# Patient Record
Sex: Female | Born: 1935
Health system: Southern US, Community
[De-identification: ages and names within clinical notes are randomized; demographics above are authoritative.]

## PROBLEM LIST (undated history)

## (undated) DIAGNOSIS — G459 Transient cerebral ischemic attack, unspecified: Secondary | ICD-10-CM

## (undated) DIAGNOSIS — H409 Unspecified glaucoma: Secondary | ICD-10-CM

## (undated) DIAGNOSIS — H269 Unspecified cataract: Secondary | ICD-10-CM

## (undated) DIAGNOSIS — I10 Essential (primary) hypertension: Secondary | ICD-10-CM

## (undated) DIAGNOSIS — I441 Atrioventricular block, second degree: Secondary | ICD-10-CM

## (undated) HISTORY — DX: Unspecified glaucoma: H40.9

## (undated) HISTORY — PX: CATARACT EXTRACTION: SUR2

## (undated) HISTORY — DX: Transient cerebral ischemic attack, unspecified: G45.9

## (undated) HISTORY — DX: Atrioventricular block, second degree: I44.1

## (undated) HISTORY — DX: Unspecified cataract: H26.9

---

## 2004-01-25 ENCOUNTER — Ambulatory Visit: Payer: Self-pay | Admitting: Unknown Physician Specialty

## 2005-01-29 ENCOUNTER — Ambulatory Visit: Payer: Self-pay | Admitting: Unknown Physician Specialty

## 2006-04-15 ENCOUNTER — Ambulatory Visit: Payer: Self-pay | Admitting: Unknown Physician Specialty

## 2007-06-30 ENCOUNTER — Ambulatory Visit: Payer: Self-pay | Admitting: Unknown Physician Specialty

## 2008-09-06 ENCOUNTER — Ambulatory Visit: Payer: Self-pay | Admitting: Unknown Physician Specialty

## 2009-09-19 ENCOUNTER — Ambulatory Visit: Payer: Self-pay | Admitting: Unknown Physician Specialty

## 2011-01-16 ENCOUNTER — Ambulatory Visit: Payer: Self-pay | Admitting: Unknown Physician Specialty

## 2012-09-29 ENCOUNTER — Ambulatory Visit: Payer: Self-pay | Admitting: Family Medicine

## 2014-06-23 ENCOUNTER — Other Ambulatory Visit: Payer: Self-pay | Admitting: Family Medicine

## 2014-06-23 ENCOUNTER — Ambulatory Visit
Admission: RE | Admit: 2014-06-23 | Discharge: 2014-06-23 | Disposition: A | Discharge status: Home / Self Care | Payer: PPO | Source: Ambulatory Visit | Attending: Family Medicine | Admitting: Family Medicine

## 2014-06-23 ENCOUNTER — Ambulatory Visit: Payer: Self-pay

## 2014-06-23 DIAGNOSIS — R600 Localized edema: Secondary | ICD-10-CM | POA: Insufficient documentation

## 2014-06-29 ENCOUNTER — Other Ambulatory Visit: Payer: Self-pay | Admitting: Family Medicine

## 2014-06-29 DIAGNOSIS — R600 Localized edema: Secondary | ICD-10-CM

## 2014-07-04 ENCOUNTER — Ambulatory Visit: Payer: PPO

## 2015-02-28 DIAGNOSIS — H401132 Primary open-angle glaucoma, bilateral, moderate stage: Secondary | ICD-10-CM | POA: Diagnosis not present

## 2015-05-28 DIAGNOSIS — H401132 Primary open-angle glaucoma, bilateral, moderate stage: Secondary | ICD-10-CM | POA: Diagnosis not present

## 2015-06-12 DIAGNOSIS — Z1389 Encounter for screening for other disorder: Secondary | ICD-10-CM | POA: Diagnosis not present

## 2015-06-12 DIAGNOSIS — Z0000 Encounter for general adult medical examination without abnormal findings: Secondary | ICD-10-CM | POA: Diagnosis not present

## 2015-06-12 DIAGNOSIS — Z1211 Encounter for screening for malignant neoplasm of colon: Secondary | ICD-10-CM | POA: Diagnosis not present

## 2015-10-01 DIAGNOSIS — H40153 Residual stage of open-angle glaucoma, bilateral: Secondary | ICD-10-CM | POA: Diagnosis not present

## 2016-01-21 DIAGNOSIS — H40153 Residual stage of open-angle glaucoma, bilateral: Secondary | ICD-10-CM | POA: Diagnosis not present

## 2016-06-09 DIAGNOSIS — H40153 Residual stage of open-angle glaucoma, bilateral: Secondary | ICD-10-CM | POA: Diagnosis not present

## 2016-10-13 DIAGNOSIS — H40153 Residual stage of open-angle glaucoma, bilateral: Secondary | ICD-10-CM | POA: Diagnosis not present

## 2016-10-16 DIAGNOSIS — I10 Essential (primary) hypertension: Secondary | ICD-10-CM | POA: Diagnosis not present

## 2016-10-22 ENCOUNTER — Other Ambulatory Visit: Payer: Self-pay | Admitting: Family Medicine

## 2016-10-22 DIAGNOSIS — M810 Age-related osteoporosis without current pathological fracture: Secondary | ICD-10-CM

## 2016-12-01 ENCOUNTER — Other Ambulatory Visit: Payer: PPO

## 2017-01-22 DIAGNOSIS — I8310 Varicose veins of unspecified lower extremity with inflammation: Secondary | ICD-10-CM | POA: Diagnosis not present

## 2017-02-27 DIAGNOSIS — I10 Essential (primary) hypertension: Secondary | ICD-10-CM | POA: Diagnosis not present

## 2017-02-27 DIAGNOSIS — B029 Zoster without complications: Secondary | ICD-10-CM | POA: Diagnosis not present

## 2017-03-20 DIAGNOSIS — B029 Zoster without complications: Secondary | ICD-10-CM | POA: Diagnosis not present

## 2017-03-20 DIAGNOSIS — D649 Anemia, unspecified: Secondary | ICD-10-CM | POA: Diagnosis not present

## 2017-03-20 DIAGNOSIS — I10 Essential (primary) hypertension: Secondary | ICD-10-CM | POA: Diagnosis not present

## 2017-03-20 DIAGNOSIS — Z23 Encounter for immunization: Secondary | ICD-10-CM | POA: Diagnosis not present

## 2017-04-06 ENCOUNTER — Emergency Department
Admission: EM | Admit: 2017-04-06 | Discharge: 2017-04-06 | Disposition: A | Discharge status: Home / Self Care | Payer: PPO | Attending: Emergency Medicine | Admitting: Emergency Medicine

## 2017-04-06 ENCOUNTER — Encounter: Payer: Self-pay | Admitting: Emergency Medicine

## 2017-04-06 ENCOUNTER — Other Ambulatory Visit: Payer: Self-pay

## 2017-04-06 ENCOUNTER — Emergency Department: Payer: PPO

## 2017-04-06 DIAGNOSIS — M79641 Pain in right hand: Secondary | ICD-10-CM | POA: Diagnosis not present

## 2017-04-06 DIAGNOSIS — S0003XA Contusion of scalp, initial encounter: Secondary | ICD-10-CM | POA: Insufficient documentation

## 2017-04-06 DIAGNOSIS — S0993XA Unspecified injury of face, initial encounter: Secondary | ICD-10-CM | POA: Diagnosis not present

## 2017-04-06 DIAGNOSIS — M25561 Pain in right knee: Secondary | ICD-10-CM | POA: Diagnosis not present

## 2017-04-06 DIAGNOSIS — Y92129 Unspecified place in nursing home as the place of occurrence of the external cause: Secondary | ICD-10-CM | POA: Diagnosis not present

## 2017-04-06 DIAGNOSIS — S0081XA Abrasion of other part of head, initial encounter: Secondary | ICD-10-CM | POA: Diagnosis not present

## 2017-04-06 DIAGNOSIS — W19XXXA Unspecified fall, initial encounter: Secondary | ICD-10-CM | POA: Insufficient documentation

## 2017-04-06 DIAGNOSIS — S80211A Abrasion, right knee, initial encounter: Secondary | ICD-10-CM | POA: Diagnosis not present

## 2017-04-06 DIAGNOSIS — Y999 Unspecified external cause status: Secondary | ICD-10-CM | POA: Insufficient documentation

## 2017-04-06 DIAGNOSIS — Y9389 Activity, other specified: Secondary | ICD-10-CM | POA: Insufficient documentation

## 2017-04-06 DIAGNOSIS — S8991XA Unspecified injury of right lower leg, initial encounter: Secondary | ICD-10-CM | POA: Diagnosis not present

## 2017-04-06 DIAGNOSIS — S0990XA Unspecified injury of head, initial encounter: Secondary | ICD-10-CM | POA: Diagnosis present

## 2017-04-06 DIAGNOSIS — S60511A Abrasion of right hand, initial encounter: Secondary | ICD-10-CM | POA: Diagnosis not present

## 2017-04-06 DIAGNOSIS — Z23 Encounter for immunization: Secondary | ICD-10-CM | POA: Insufficient documentation

## 2017-04-06 HISTORY — DX: Essential (primary) hypertension: I10

## 2017-04-06 MED ORDER — BACITRACIN ZINC 500 UNIT/GM EX OINT
TOPICAL_OINTMENT | CUTANEOUS | Status: AC
  Administered 2017-04-06: 1 via TOPICAL
  Filled 2017-04-06: qty 0.9

## 2017-04-06 MED ORDER — ACETAMINOPHEN 325 MG PO TABS
650.0000 mg | ORAL_TABLET | Freq: Once | ORAL | Status: AC
  Administered 2017-04-06: 650 mg via ORAL
  Filled 2017-04-06: qty 2

## 2017-04-06 MED ORDER — TETANUS-DIPHTH-ACELL PERTUSSIS 5-2.5-18.5 LF-MCG/0.5 IM SUSP
0.5000 mL | Freq: Once | INTRAMUSCULAR | Status: AC
  Administered 2017-04-06: 0.5 mL via INTRAMUSCULAR
  Filled 2017-04-06: qty 0.5

## 2017-04-06 MED ORDER — BACITRACIN ZINC 500 UNIT/GM EX OINT
TOPICAL_OINTMENT | Freq: Every day | CUTANEOUS | Status: DC
  Administered 2017-04-06: 1 via TOPICAL

## 2017-04-06 NOTE — ED Notes (Signed)
See triage note  States she fell today    hhit the cement  Abrasions noted to right side of forehead and temples ,right hand and right knee  No loc

## 2017-04-06 NOTE — ED Provider Notes (Signed)
[facility] Emergency Department Provider Note  ____________________________________________  Time seen: Approximately 5:18 PM  I have reviewed the triage vital signs and the nursing notes.   HISTORY  Chief Complaint Fall    HPI Cassandra Patterson is a 84 y.o. female presents to the emergency department after patient fell while leaving hospice after visiting her sister-in-law.  Patient struck the right side of her forehead and face as well as her right hand and right knee.  Patient is accompanied by her sister who reports no changes in behavior.  Patient denies new blurry vision, nausea, vomiting or disorientation.  Patient was able to ambulate without difficulty and has had no neck pain or changes in sensation in the upper or lower extremities.  No medications were attempted prior to presenting to the emergency department.  Patient lives with her husband independently.   Past Medical History:  Diagnosis Date  . Hypertension     There are no active problems to display for this patient.   History reviewed. No pertinent surgical history.  Prior to Admission medications   Not on File    Allergies Patient has no known allergies.  No family history on file.  Social History Social History   Tobacco Use  . Smoking status: Never Smoker  . Smokeless tobacco: Never Used  Substance Use Topics  . Alcohol use: No    Frequency: Never  . Drug use: No     Review of Systems  Constitutional: No fever/chills Eyes: No visual changes. No discharge ENT: No upper respiratory complaints. Cardiovascular: no chest pain. Respiratory: no cough. No SOB. Gastrointestinal: No abdominal pain.  No nausea, no vomiting.  No diarrhea.  No constipation. Musculoskeletal: Negative for musculoskeletal pain. Skin: Patient has abrasions. Neurological: Negative for headaches, focal weakness or numbness.   ____________________________________________   PHYSICAL  EXAM:  VITAL SIGNS: ED Triage Vitals [04/06/17 1508]  Enc Vitals Group     BP 133/81     Pulse Rate 74     Resp 16     Temp (!) 97.2 F (36.2 C)     Temp Source Oral     SpO2 99 %     Weight 110 lb (49.9 kg)     Height      Head Circumference      Peak Flow      Pain Score      Pain Loc      Pain Edu?      Excl. in GC?      Constitutional: Alert and oriented. Well appearing and in no acute distress. Eyes: Conjunctivae are normal. PERRL. EOMI. Head: Atraumatic. ENT:      Ears: TMs are pearly      Nose: No congestion/rhinnorhea.      Mouth/Throat: Mucous membranes are moist.  Neck: No stridor.  No cervical spine tenderness to palpation. Cardiovascular: Normal rate, regular rhythm. Normal S1 and S2.  Good peripheral circulation. Respiratory: Normal respiratory effort without tachypnea or retractions. Lungs CTAB. Good air entry to the bases with no decreased or absent breath sounds. Gastrointestinal: Bowel sounds 4 quadrants. Soft and nontender to palpation. No guarding or rigidity. No palpable masses. No distention. No CVA tenderness. Musculoskeletal: Full range of motion to all extremities. No gross deformities appreciated. Neurologic:  Normal speech and language. No gross focal neurologic deficits are appreciated.  Skin: Patient has facial abrasions and abrasions of right hand and right knee. Psychiatric: Mood and affect are normal. Speech and behavior are normal.  Patient exhibits appropriate insight and judgement.   ____________________________________________   LABS (all labs ordered are listed, but only abnormal results are displayed)  Labs Reviewed - No data to display ____________________________________________  EKG   ____________________________________________  RADIOLOGY Geraldo Pitter, personally viewed and evaluated these images (plain radiographs) as part of my medical decision making, as well as reviewing the written report by the  radiologist.  Ct Head Wo Contrast  Result Date: 04/06/2017 CLINICAL DATA:  Fall.  Right forehead and cheek abrasions. EXAM: CT HEAD WITHOUT CONTRAST CT MAXILLOFACIAL WITHOUT CONTRAST TECHNIQUE: Multidetector CT imaging of the head and maxillofacial structures were performed using the standard protocol without intravenous contrast. Multiplanar CT image reconstructions of the maxillofacial structures were also generated. COMPARISON:  None. FINDINGS: CT HEAD FINDINGS Brain: No evidence of acute infarction, hemorrhage, hydrocephalus, extra-axial collection or mass lesion/mass effect. Moderate age related cerebral atrophy. Vascular: Atherosclerotic vascular calcification of the carotid siphons. No hyperdense vessel. Skull: Negative for fracture or focal lesion. Other: Small right frontal scalp hematoma. CT MAXILLOFACIAL FINDINGS Osseous: No fracture or mandibular dislocation. No destructive process. Degenerative changes of the cervical spine. Orbits: Negative. No traumatic or inflammatory finding. Sinuses: Clear. Soft tissues: Negative. IMPRESSION: 1. No acute intracranial abnormality. Small right frontal scalp hematoma. 2. No facial fracture. Electronically Signed   By: Obie Dredge M.D.   On: 04/06/2017 16:07   Dg Knee Complete 4 Views Right  Result Date: 04/06/2017 CLINICAL DATA:  Larey Seat today with trauma to the right side of the face, right hand and right knee. EXAM: RIGHT KNEE - COMPLETE 4+ VIEW COMPARISON:  None. FINDINGS: No evidence of fracture, dislocation, or joint effusion. No evidence of arthropathy or other focal bone abnormality. Soft tissues are unremarkable. IMPRESSION: Negative. Electronically Signed   By: Paulina Fusi M.D.   On: 04/06/2017 16:37   Dg Hand Complete Right  Result Date: 04/06/2017 CLINICAL DATA:  Acute right hand pain following fall today. Initial encounter. EXAM: RIGHT HAND - COMPLETE 3+ VIEW COMPARISON:  None. FINDINGS: There is no evidence of fracture or dislocation. No  focal bony lesions are present. Soft tissues are unremarkable. IMPRESSION: No evidence of acute abnormality. Electronically Signed   By: Harmon Pier M.D.   On: 04/06/2017 16:34   Ct Maxillofacial Wo Contrast  Result Date: 04/06/2017 CLINICAL DATA:  Fall.  Right forehead and cheek abrasions. EXAM: CT HEAD WITHOUT CONTRAST CT MAXILLOFACIAL WITHOUT CONTRAST TECHNIQUE: Multidetector CT imaging of the head and maxillofacial structures were performed using the standard protocol without intravenous contrast. Multiplanar CT image reconstructions of the maxillofacial structures were also generated. COMPARISON:  None. FINDINGS: CT HEAD FINDINGS Brain: No evidence of acute infarction, hemorrhage, hydrocephalus, extra-axial collection or mass lesion/mass effect. Moderate age related cerebral atrophy. Vascular: Atherosclerotic vascular calcification of the carotid siphons. No hyperdense vessel. Skull: Negative for fracture or focal lesion. Other: Small right frontal scalp hematoma. CT MAXILLOFACIAL FINDINGS Osseous: No fracture or mandibular dislocation. No destructive process. Degenerative changes of the cervical spine. Orbits: Negative. No traumatic or inflammatory finding. Sinuses: Clear. Soft tissues: Negative. IMPRESSION: 1. No acute intracranial abnormality. Small right frontal scalp hematoma. 2. No facial fracture. Electronically Signed   By: Obie Dredge M.D.   On: 04/06/2017 16:07    ____________________________________________    PROCEDURES  Procedure(s) performed:    Procedures    Medications  acetaminophen (TYLENOL) tablet 650 mg (not administered)  Tdap (BOOSTRIX) injection 0.5 mL (not administered)     ____________________________________________   INITIAL IMPRESSION /  ASSESSMENT AND PLAN / ED COURSE  Pertinent labs & imaging results that were available during my care of the patient were reviewed by me and considered in my medical decision making (see chart for details).  Review  of the [place] CSRS was performed in accordance of the [facility] prior to dispensing any controlled drugs.     Assessment and plan Fall Differential diagnosis originally included subdural hematoma, skull fracture, fracture of the upper or lower extremities, knee contusion and skin abrasions.  Patient presented to the emergency department after falling while leaving hospice while visiting a family member.  Overall neurologic exam was reassuring.  CT head and CT maxillofacial revealed no acute abnormality. x-rays of the right hand and right knee were reviewed by myself and revealed no acute fractures or bony abnormalities.  Patient's tetanus was updated in the emergency department and Tylenol was recommended for pain.  All patient questions were answered.     ____________________________________________  FINAL CLINICAL IMPRESSION(S) / ED DIAGNOSES  Final diagnoses:  Fall, initial encounter      NEW MEDICATIONS STARTED DURING THIS VISIT:  ED Discharge Orders    None          This chart was dictated using voice recognition software/Dragon. Despite best efforts to proofread, errors can occur which can change the meaning. Any change was purely unintentional.    Orvil Feil, PA-C 04/06/17 1721    Phineas Semen, MD 04/06/17 Paulo Fruit

## 2017-04-06 NOTE — ED Triage Notes (Signed)
Pt to ed with c/o fall today and hit right side of face, right hand and right knee.  Abrasions noted to right forehead, and right cheek, and right hand and right knee, bleeding controlled.  Denies loss of consciousness.  Pt denies use of blood thinners.  Pt alert and oriented at this time.

## 2017-04-07 ENCOUNTER — Other Ambulatory Visit: Payer: Self-pay

## 2017-05-01 DIAGNOSIS — L03115 Cellulitis of right lower limb: Secondary | ICD-10-CM | POA: Diagnosis not present

## 2017-05-15 DIAGNOSIS — L03115 Cellulitis of right lower limb: Secondary | ICD-10-CM | POA: Diagnosis not present

## 2017-05-15 DIAGNOSIS — I10 Essential (primary) hypertension: Secondary | ICD-10-CM | POA: Diagnosis not present

## 2017-09-24 DIAGNOSIS — H40153 Residual stage of open-angle glaucoma, bilateral: Secondary | ICD-10-CM | POA: Diagnosis not present

## 2017-10-08 DIAGNOSIS — R634 Abnormal weight loss: Secondary | ICD-10-CM | POA: Diagnosis not present

## 2017-10-20 DIAGNOSIS — R634 Abnormal weight loss: Secondary | ICD-10-CM | POA: Diagnosis not present

## 2017-12-11 DIAGNOSIS — Z961 Presence of intraocular lens: Secondary | ICD-10-CM | POA: Diagnosis not present

## 2017-12-11 DIAGNOSIS — H2513 Age-related nuclear cataract, bilateral: Secondary | ICD-10-CM | POA: Diagnosis not present

## 2017-12-17 DIAGNOSIS — H2512 Age-related nuclear cataract, left eye: Secondary | ICD-10-CM | POA: Diagnosis not present

## 2017-12-30 DIAGNOSIS — H2512 Age-related nuclear cataract, left eye: Secondary | ICD-10-CM | POA: Diagnosis not present

## 2018-01-28 DIAGNOSIS — H40153 Residual stage of open-angle glaucoma, bilateral: Secondary | ICD-10-CM | POA: Diagnosis not present

## 2018-01-28 DIAGNOSIS — Z961 Presence of intraocular lens: Secondary | ICD-10-CM | POA: Diagnosis not present

## 2018-06-18 DIAGNOSIS — I10 Essential (primary) hypertension: Secondary | ICD-10-CM | POA: Diagnosis not present

## 2018-07-19 DIAGNOSIS — H40153 Residual stage of open-angle glaucoma, bilateral: Secondary | ICD-10-CM | POA: Diagnosis not present

## 2018-09-23 DIAGNOSIS — H40153 Residual stage of open-angle glaucoma, bilateral: Secondary | ICD-10-CM | POA: Diagnosis not present

## 2018-11-09 IMAGING — CR DG HAND COMPLETE 3+V*R*
1 series · 3 of 3 positions shown · non-contrast
Comparison: None.

CLINICAL DATA: Acute right hand pain following fall today. Initial
encounter.

EXAM:
RIGHT HAND - COMPLETE 3+ VIEW

[Series 1: dg hand complete right · 0.14mm/px · 3 of 3 slices shown]
[im 1/3]
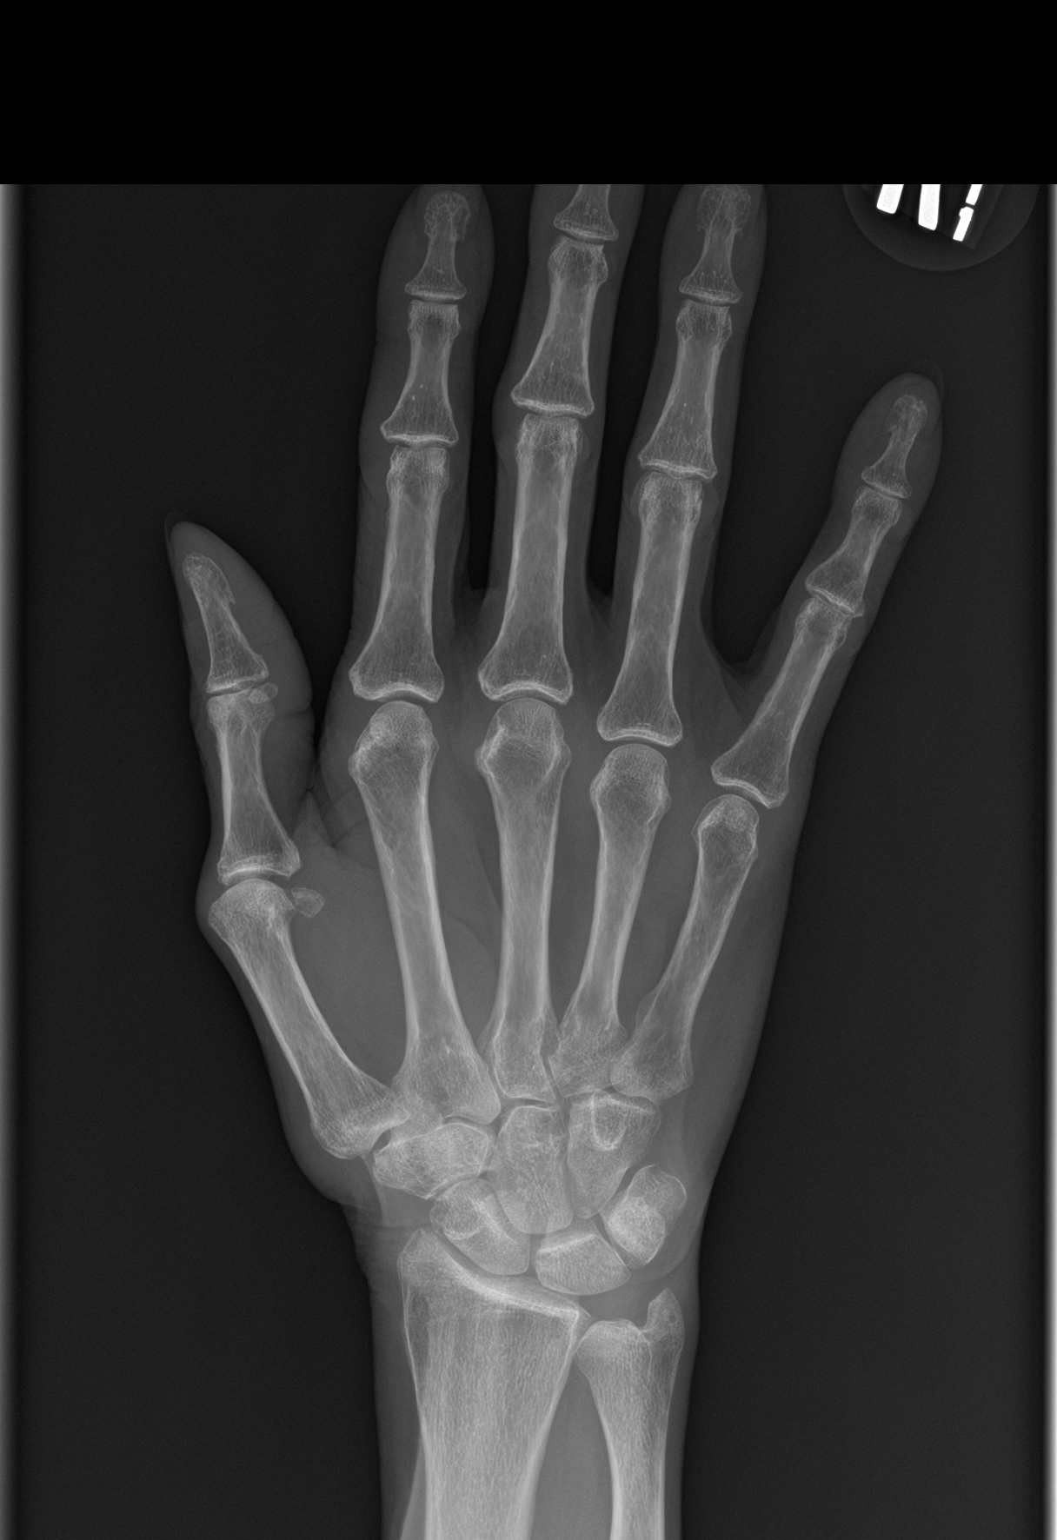
[im 2/3]
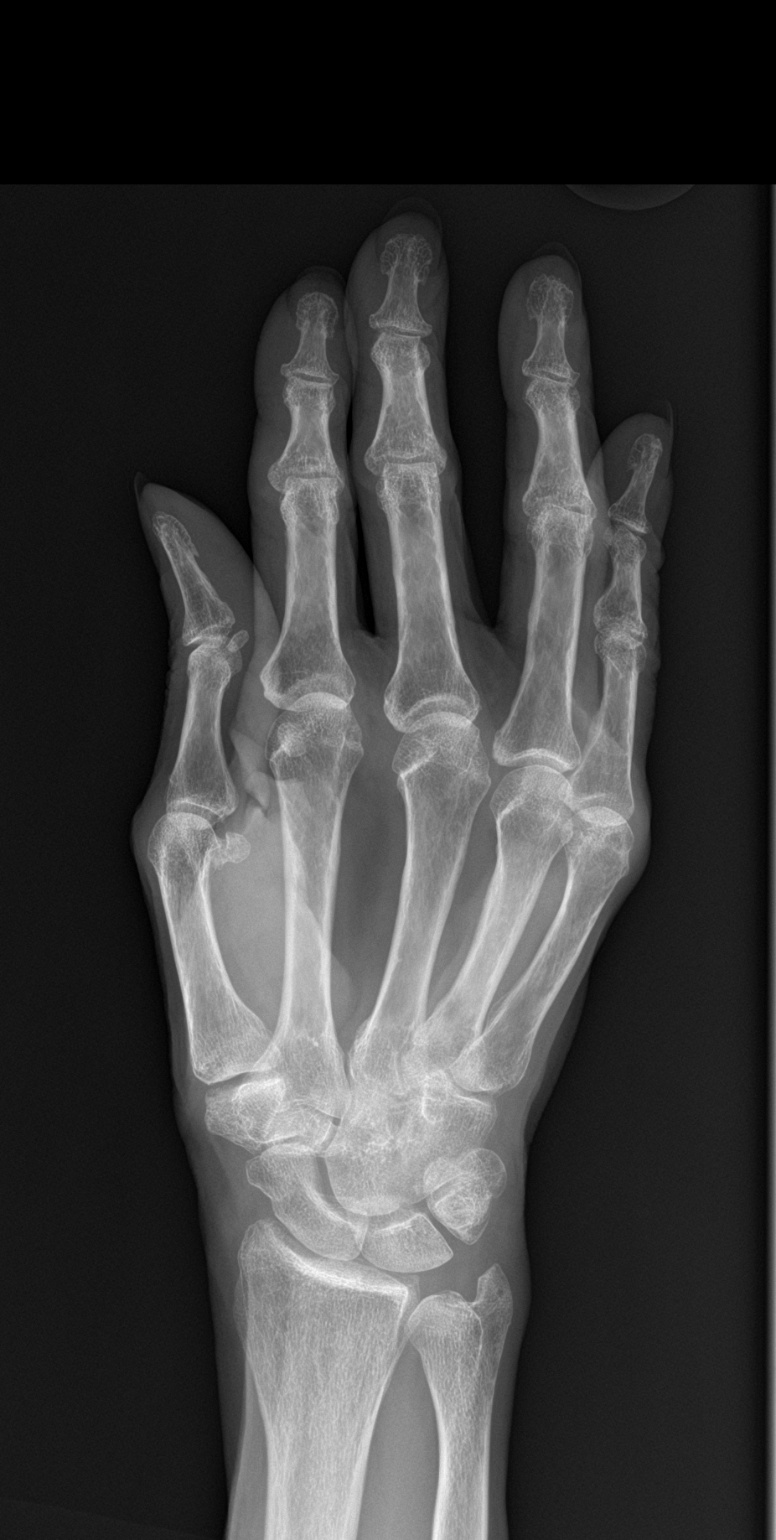
[im 3/3]
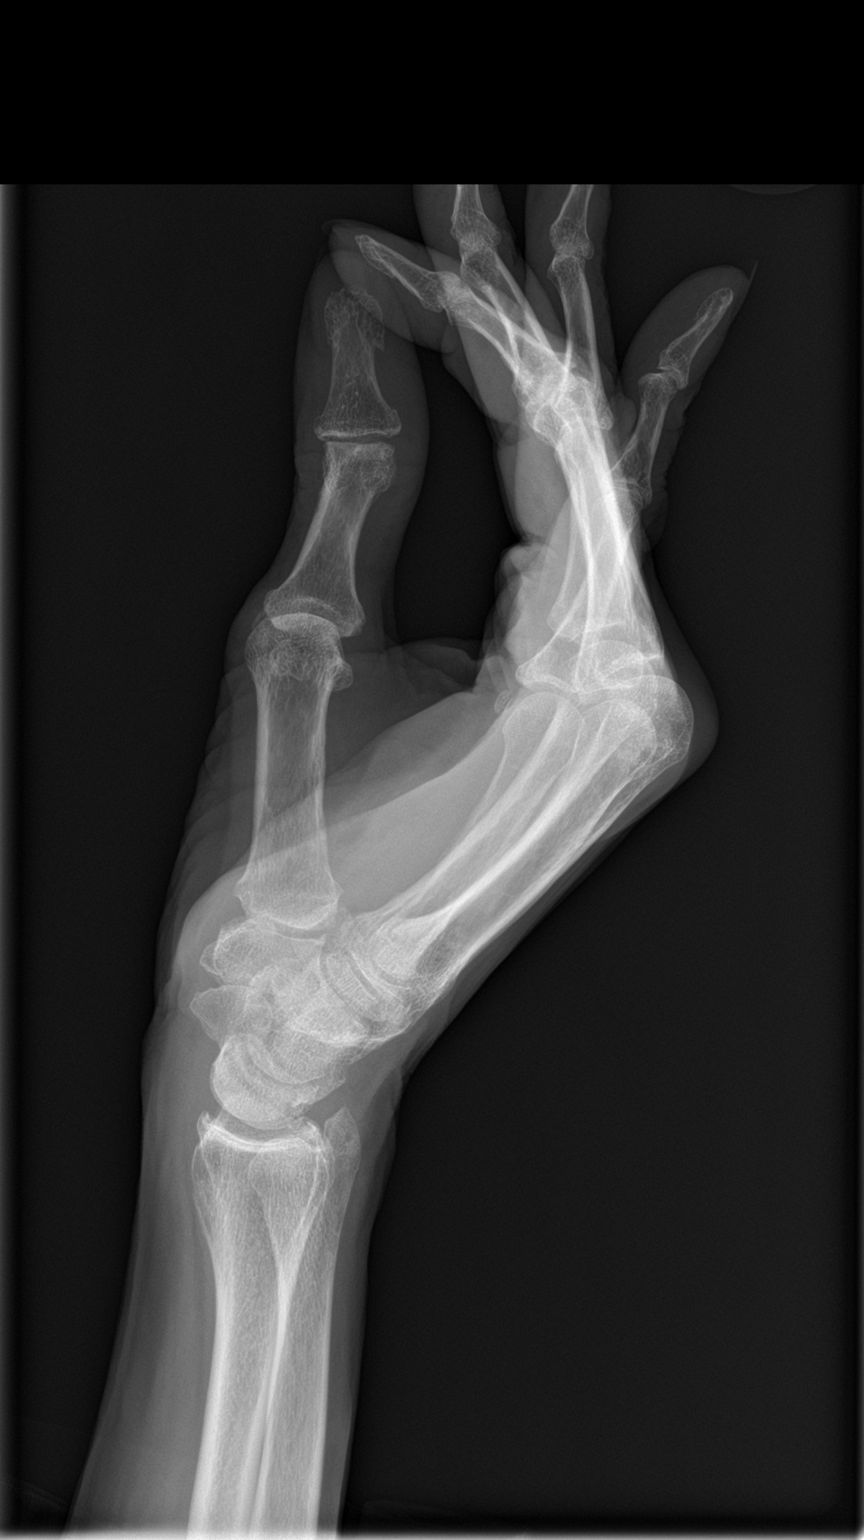

[3 of 3 positions shown; findings below may reference images not displayed]

FINDINGS: There is no evidence of fracture or dislocation.

No focal bony lesions are present.

Soft tissues are unremarkable.
IMPRESSION: No evidence of acute abnormality.

## 2018-11-22 DIAGNOSIS — H40153 Residual stage of open-angle glaucoma, bilateral: Secondary | ICD-10-CM | POA: Diagnosis not present

## 2018-12-14 DIAGNOSIS — E559 Vitamin D deficiency, unspecified: Secondary | ICD-10-CM | POA: Diagnosis not present

## 2018-12-14 DIAGNOSIS — I10 Essential (primary) hypertension: Secondary | ICD-10-CM | POA: Diagnosis not present

## 2019-01-06 DIAGNOSIS — Z20828 Contact with and (suspected) exposure to other viral communicable diseases: Secondary | ICD-10-CM | POA: Diagnosis not present

## 2019-03-01 DIAGNOSIS — L989 Disorder of the skin and subcutaneous tissue, unspecified: Secondary | ICD-10-CM | POA: Diagnosis not present

## 2019-03-01 DIAGNOSIS — Z1389 Encounter for screening for other disorder: Secondary | ICD-10-CM | POA: Diagnosis not present

## 2019-03-02 DIAGNOSIS — Z23 Encounter for immunization: Secondary | ICD-10-CM | POA: Diagnosis not present

## 2019-03-23 DIAGNOSIS — Z23 Encounter for immunization: Secondary | ICD-10-CM | POA: Diagnosis not present

## 2019-03-29 DIAGNOSIS — H40153 Residual stage of open-angle glaucoma, bilateral: Secondary | ICD-10-CM | POA: Diagnosis not present

## 2019-04-04 DIAGNOSIS — I8310 Varicose veins of unspecified lower extremity with inflammation: Secondary | ICD-10-CM | POA: Diagnosis not present

## 2019-04-21 DIAGNOSIS — I10 Essential (primary) hypertension: Secondary | ICD-10-CM | POA: Diagnosis not present

## 2019-04-21 DIAGNOSIS — Z23 Encounter for immunization: Secondary | ICD-10-CM | POA: Diagnosis not present

## 2019-04-21 DIAGNOSIS — M810 Age-related osteoporosis without current pathological fracture: Secondary | ICD-10-CM | POA: Diagnosis not present

## 2019-06-24 DIAGNOSIS — M5441 Lumbago with sciatica, right side: Secondary | ICD-10-CM | POA: Diagnosis not present

## 2019-08-01 DIAGNOSIS — H40153 Residual stage of open-angle glaucoma, bilateral: Secondary | ICD-10-CM | POA: Diagnosis not present

## 2019-12-19 DIAGNOSIS — M2012 Hallux valgus (acquired), left foot: Secondary | ICD-10-CM | POA: Diagnosis not present

## 2019-12-19 DIAGNOSIS — M79675 Pain in left toe(s): Secondary | ICD-10-CM | POA: Diagnosis not present

## 2019-12-19 DIAGNOSIS — M2011 Hallux valgus (acquired), right foot: Secondary | ICD-10-CM | POA: Diagnosis not present

## 2019-12-19 DIAGNOSIS — M79674 Pain in right toe(s): Secondary | ICD-10-CM | POA: Diagnosis not present

## 2019-12-19 DIAGNOSIS — L600 Ingrowing nail: Secondary | ICD-10-CM | POA: Diagnosis not present

## 2019-12-19 DIAGNOSIS — B351 Tinea unguium: Secondary | ICD-10-CM | POA: Diagnosis not present

## 2020-01-09 DIAGNOSIS — R002 Palpitations: Secondary | ICD-10-CM | POA: Diagnosis not present

## 2020-01-25 ENCOUNTER — Encounter: Payer: Self-pay | Admitting: Internal Medicine

## 2020-01-25 ENCOUNTER — Ambulatory Visit: Payer: PPO | Admitting: Internal Medicine

## 2020-01-25 ENCOUNTER — Ambulatory Visit (INDEPENDENT_AMBULATORY_CARE_PROVIDER_SITE_OTHER): Payer: PPO

## 2020-01-25 ENCOUNTER — Other Ambulatory Visit: Payer: Self-pay

## 2020-01-25 VITALS — BP 130/70 | HR 48 | Ht 60.0 in | Wt 97.0 lb

## 2020-01-25 DIAGNOSIS — I440 Atrioventricular block, first degree: Secondary | ICD-10-CM | POA: Diagnosis not present

## 2020-01-25 DIAGNOSIS — R002 Palpitations: Secondary | ICD-10-CM | POA: Diagnosis not present

## 2020-01-25 DIAGNOSIS — R001 Bradycardia, unspecified: Secondary | ICD-10-CM

## 2020-01-25 DIAGNOSIS — I10 Essential (primary) hypertension: Secondary | ICD-10-CM

## 2020-01-25 DIAGNOSIS — R011 Cardiac murmur, unspecified: Secondary | ICD-10-CM

## 2020-01-25 NOTE — Patient Instructions (Signed)
Ask your eye doctor if you can stop your Timolol because of your Bradycardia and first degree heart block.   Medication Instructions:  Your physician recommends that you continue on your current medications as directed. Please refer to the Current Medication list given to you today.  *If you need a refill on your cardiac medications before your next appointment, please call your pharmacy*   Lab Work: none If you have labs (blood work) drawn today and your tests are completely normal, you will receive your results only by: Marland Kitchen MyChart Message (if you have MyChart) OR . A paper copy in the mail If you have any lab test that is abnormal or we need to change your treatment, we will call you to review the results.   Testing/Procedures: 1- Your physician has requested that you have an echocardiogram. Echocardiography is a painless test that uses sound waves to create images of your heart. It provides your doctor with information about the size and shape of your heart and how well your heart's chambers and valves are working. This procedure takes approximately one hour. There are no restrictions for this procedure. There is a possibility that an IV may need to be started during your test to inject an image enhancing agent. This is done to obtain more optimal pictures of your heart. Therefore we ask that you do at least drink some water prior to coming in to hydrate your veins.   2- ZIO XT MONITOR FOR 14 days. Your physician has recommended that you wear a Zio monitor. This monitor is a medical device that records the heart's electrical activity. Doctors most often use these monitors to diagnose arrhythmias. Arrhythmias are problems with the speed or rhythm of the heartbeat. The monitor is a small device applied to your chest. You can wear one while you do your normal daily activities. While wearing this monitor if you have any symptoms to push the button and record what you felt. Once you have worn this  monitor for the period of time provider prescribed (Usually 14 days), you will return the monitor device in the postage paid box. Once it is returned they will download the data collected and provide [place] with a report which the provider will then review and we will call you with those results. Important tips:  1. Avoid showering during the first 24 hours of wearing the monitor. 2. Avoid excessive sweating to help maximize wear time. 3. Do not submerge the device, no hot tubs, and no swimming pools. 4. Keep any lotions or oils away from the patch. 5. After 24 hours you may shower with the patch on. Take brief showers with your back facing the shower head.  6. Do not remove patch once it has been placed because that will interrupt data and decrease adhesive wear time. 7. Push the button when you have any symptoms and write down what you were feeling. 8. Once you have completed wearing your monitor, remove and place into box which has postage paid and place in your outgoing mailbox.  9. If for some reason you have misplaced your box then call our office and we can provide another box and/or mail it off for you.   Follow-Up: At [facility], you and your health needs are our priority.  As part of our continuing mission to provide you with exceptional heart care, we have created designated Provider Care Teams.  These Care Teams include your primary Cardiologist (physician) and Advanced Practice Providers (APPs -  Physician Assistants and Nurse Practitioners) who all work together to provide you with the care you need, when you need it.  We recommend signing up for the patient portal called "MyChart".  Sign up information is provided on this After Visit Summary.  MyChart is used to connect with patients for Virtual Visits (Telemedicine).  Patients are able to view lab/test results, encounter notes, upcoming appointments, etc.  Non-urgent messages can be sent to your provider as well.   To learn more  about what you can do with MyChart, go to [email or web address].    Your next appointment:   6 week(s)  The format for your next appointment:   In Person  Provider:   You may see DR Cristal Deer END or one of the following Advanced Practice Providers on your designated Care Team:    Nicolasa Ducking, NP  Eula Listen, PA-C  Marisue Ivan, PA-C  [place], [place]  Gillian Shields, NP   Echocardiogram An echocardiogram is a procedure that uses painless sound waves (ultrasound) to produce an image of the heart. Images from an echocardiogram can provide important information about:  Signs of coronary artery disease (CAD).  Aneurysm detection. An aneurysm is a weak or damaged part of an artery wall that bulges out from the normal force of blood pumping through the body.  Heart size and shape. Changes in the size or shape of the heart can be associated with certain conditions, including heart failure, aneurysm, and CAD.  Heart muscle function.  Heart valve function.  Signs of a past heart attack.  Fluid buildup around the heart.  Thickening of the heart muscle.  A tumor or infectious growth around the heart valves. Tell a health care provider about:  Any allergies you have.  All medicines you are taking, including vitamins, herbs, eye drops, creams, and over-the-counter medicines.  Any blood disorders you have.  Any surgeries you have had.  Any medical conditions you have.  Whether you are pregnant or may be pregnant. What are the risks? Generally, this is a safe procedure. However, problems may occur, including:  Allergic reaction to dye (contrast) that may be used during the procedure. What happens before the procedure? No specific preparation is needed. You may eat and drink normally. What happens during the procedure?   An IV tube may be inserted into one of your veins.  You may receive contrast through this tube. A contrast is an injection that  improves the quality of the pictures from your heart.  A gel will be applied to your chest.  A wand-like tool (transducer) will be moved over your chest. The gel will help to transmit the sound waves from the transducer.  The sound waves will harmlessly bounce off of your heart to allow the heart images to be captured in real-time motion. The images will be recorded on a computer. The procedure may vary among health care providers and hospitals. What happens after the procedure?  You may return to your normal, everyday life, including diet, activities, and medicines, unless your health care provider tells you not to do that. Summary  An echocardiogram is a procedure that uses painless sound waves (ultrasound) to produce an image of the heart.  Images from an echocardiogram can provide important information about the size and shape of your heart, heart muscle function, heart valve function, and fluid buildup around your heart.  You do not need to do anything to prepare before this procedure. You may eat and drink normally.  After  the echocardiogram is completed, you may return to your normal, everyday life, unless your health care provider tells you not to do that. This information is not intended to replace advice given to you by your health care provider. Make sure you discuss any questions you have with your health care provider. Document Revised: 05/27/2018 Document Reviewed: 03/08/2016 Elsevier Patient Education  2020 [employer].

## 2020-01-25 NOTE — Progress Notes (Signed)
New Outpatient Visit Date: 01/25/2020  Referring Provider: Leanna Sato, MD [address] [place],  [place] [address]  Chief Complaint: Palpitations  HPI:  Ms. Gilham is a 86 y.o. female who is being seen today for the evaluation of palpitations at the request of Dr. Marvis Moeller. She has a history of hypertension and glaucoma. She reports episodic racing of her heart, often when she is startled by loud noises. On average, this happens about once a week and lasts for several minutes at a time. There are no associated symptoms such as shortness of breath, chest pain, and lightheadedness. Ms. Sallyanne Kuster denies passing out. She has not had any heart problems in the past nor has she undergone previous cardiac testing to her knowledge.  --------------------------------------------------------------------------------------------------  Cardiovascular History & Procedures: Cardiovascular Problems:  Palpitation  Risk Factors:  Hypertension and age greater than 41  Cath/PCI:  None  CV Surgery:  None  EP Procedures and Devices:  None  Non-Invasive Evaluation(s):  None  --------------------------------------------------------------------------------------------------  Past Medical History:  Diagnosis Date  . Cataracts, bilateral   . Glaucoma   . Hypertension     Past Surgical History:  Procedure Laterality Date  . CATARACT EXTRACTION      Current Meds  Medication Sig  . acetaminophen (TYLENOL 8 HOUR ARTHRITIS PAIN) 650 MG CR tablet Take 650 mg by mouth every 8 (eight) hours as needed for pain.  Marland Kitchen ammonium lactate (AMLACTIN) 12 % cream Apply topically.  Marland Kitchen aspirin 81 MG EC tablet Take by mouth.  . brimonidine (ALPHAGAN) 0.15 % ophthalmic solution   . brimonidine-timolol (COMBIGAN) 0.2-0.5 % ophthalmic solution Place 1 drop into both eyes every 12 (twelve) hours.  . COD LIVER OIL PO Take by mouth.  . felodipine (PLENDIL) 10 MG 24 hr tablet Take by mouth.  .  hydrochlorothiazide (MICROZIDE) 12.5 MG capsule TAKE 1 CAPSULE BY MOUTH EVERY MORNING FOR HIGH BLOOD PRESSURE  . Multiple Vitamins-Minerals (ONE-A-DAY WOMENS PO) Take by mouth.  . Travoprost, BAK Free, (TRAVATAN) 0.004 % SOLN ophthalmic solution INSTILL 1 DROP BOTH EYES AT NIGHTTIME  . [DISCONTINUED] timolol (TIMOPTIC) 0.5 % ophthalmic solution     Allergies: Patient has no known allergies.  Social History   Tobacco Use  . Smoking status: Never Smoker  . Smokeless tobacco: Never Used  Vaping Use  . Vaping Use: Never used  Substance Use Topics  . Alcohol use: No  . Drug use: No    Family History  Problem Relation Age of Onset  . Hypertension Mother   . Heart disease Neg Hx     Review of Systems: A 12-system review of systems was performed and was negative except as noted in the HPI.  --------------------------------------------------------------------------------------------------  Physical Exam: BP 130/70 (BP Location: Right Arm, Patient Position: Sitting, Cuff Size: Normal)   Pulse (!) 48   Ht 5' (1.524 m)   Wt 97 lb (44 kg)   SpO2 97%   BMI 18.94 kg/m   General: Thin, elderly woman, seated comfortably on the exam table. She is accompanied by her daughter. HEENT: No conjunctival pallor or scleral icterus. Facemask in place. Neck: Supple without lymphadenopathy, thyromegaly, JVD, or HJR. No carotid bruit. Lungs: Normal work of breathing. Clear to auscultation bilaterally without wheezes or crackles. Heart: Bradycardic but regular with 2/6 holosystolic murmur. No rubs or gallops. Abd: Bowel sounds present. Soft, NT/ND without hepatosplenomegaly Ext: No lower extremity edema. Radial, PT, and DP pulses are 2+ bilaterally Skin: Warm and dry without rash. Neuro: CNIII-XII  intact. Strength and fine-touch sensation intact in upper and lower extremities bilaterally. Psych: Normal mood and affect.  EKG:  Sinus bradycardia with first-degree AV block (PR interval 348 ms), left  axis deviation, and poor R wave progression.  --------------------------------------------------------------------------------------------------  ASSESSMENT AND PLAN: Palpitations: Episodes are happening once a week and are without associated symptoms. However, Ms. Sellers is bothered by these events. I have recommended obtaining a 14-day event monitor for further characterization.  Heart murmur: Incidentally noted on examination today. In the setting of palpitations as well as abnormal EKG with first-degree AV block, left axis deviation, and poor R wave progression, I have recommended obtaining a transthoracic echocardiogram.  Hypertension: Blood pressure upper normal today. Continue current medications, with ongoing monitoring per PCP.  Bradycardia and first-degree AV block: EKG today notable for significant sinus bradycardia and first-degree AV block, albeit without symptoms. We will review outside records to determine if thyroid function tests have been checked. I have encouraged Ms. Sellers to speak with her eye care provider about discontinuation of atenolol, as this could be contributing to her bradycardia and AV block. She does not report symptoms of high-grade AV block. In light of aforementioned palpitations, we will obtain a 14-day event monitor.  Follow-up: Return to clinic in 6 weeks.  Yvonne Kendall, MD 01/26/2020 10:23 AM

## 2020-01-26 ENCOUNTER — Encounter: Payer: Self-pay | Admitting: Internal Medicine

## 2020-01-26 DIAGNOSIS — R011 Cardiac murmur, unspecified: Secondary | ICD-10-CM | POA: Insufficient documentation

## 2020-01-26 DIAGNOSIS — R001 Bradycardia, unspecified: Secondary | ICD-10-CM | POA: Insufficient documentation

## 2020-01-26 DIAGNOSIS — R002 Palpitations: Secondary | ICD-10-CM | POA: Insufficient documentation

## 2020-01-26 DIAGNOSIS — I10 Essential (primary) hypertension: Secondary | ICD-10-CM | POA: Insufficient documentation

## 2020-01-26 DIAGNOSIS — I440 Atrioventricular block, first degree: Secondary | ICD-10-CM | POA: Insufficient documentation

## 2020-02-15 ENCOUNTER — Telehealth: Payer: Self-pay | Admitting: *Deleted

## 2020-02-15 ENCOUNTER — Telehealth: Payer: Self-pay | Admitting: Internal Medicine

## 2020-02-15 DIAGNOSIS — R002 Palpitations: Secondary | ICD-10-CM | POA: Diagnosis not present

## 2020-02-15 NOTE — Telephone Encounter (Signed)
irhythm calling with a critical result  Transferred to [employer]

## 2020-02-15 NOTE — Telephone Encounter (Signed)
-----   Message from Yvonne Kendall, MD sent at 02/15/2020 10:37 AM EST ----- Please let the patient know that her event monitor showed multiple pauses of up to 4.1 seconds.  Please confirm that she has discontinued eyedrops containing timolol and arrange for her to see me or an APP as soon as possible to reassess her symptoms and determine if referral to EP is necessary.

## 2020-02-15 NOTE — Telephone Encounter (Signed)
Spoke with Alinda Money at [place]. She reported that the patient had 120 episodes of pauses. On 02/02/20 she had 2 back to back episodes totaling 6.1 secs. I spoke with Dr. Okey Dupre, he reviewed Zio report and will further review after resulted to his inbasket. Report being uploaded now.

## 2020-02-15 NOTE — Telephone Encounter (Signed)
Spoke with Cassandra Patterson per patients request and reviewed results and recommendations. Spelled eye drops to stop and reviewed that this could be one issue they are concerned with. Also scheduled appointment for next week with Dr. Okey Dupre at 08:00 AM on 02/22/20. She verbalized understanding, confirmed appointment time, and had no further questions at this time. She states she will reach out to [name] and review this information with her. She was appreciative for the call with no other concerns at this time.

## 2020-02-15 NOTE — Telephone Encounter (Signed)
Spoke with patient and reviewed results and recommendations. She reports that she has continued her eye drops and had not stopped any. Reviewed that she needs to stop the one but she reports that she was not aware of this information. She states that her sister handles all of her medications and appointments. She provided me with number to call [phone] and her name is [name]. Advised I would give her a call.

## 2020-02-15 NOTE — Telephone Encounter (Signed)
See other telephone encounter. Spoke with patient and caregiver with results and recommendations.

## 2020-02-16 ENCOUNTER — Telehealth: Payer: Self-pay | Admitting: Internal Medicine

## 2020-02-16 NOTE — Telephone Encounter (Signed)
Please call patient's sister to verify the eyedrop that patient is to discontinue.

## 2020-02-16 NOTE — Telephone Encounter (Signed)
Left voicemail message to call back  

## 2020-02-16 NOTE — Telephone Encounter (Signed)
Patients sister calling back to clarify drops to stop and she read each bottle out. She did spell out the correct one with Timolol and requested that she please stop that one. She was appreciative for the time and review with no further questions at this time.

## 2020-02-20 NOTE — Progress Notes (Signed)
Follow-up Outpatient Visit Date: 02/22/2020  Primary Care Provider: Center, [facility] [address] [place] [address]  Chief Complaint: Follow-up palpitations and bradycardia  HPI:  Cassandra Patterson is a 87 y.o. female with history of hypertension and glaucoma, who presents for follow-up of palpitations and a recent event monitor showing multiple episodes of second-degree AV block.  Today, Ms. Antuna reports that she has been feeling well.  Atenolol was discontinued at our recommendation about 1.5 weeks ago.  Her daughter feels like the patient may have a little bit more energy since stopping this medicine.  She is scheduled to follow-up with her optometrist in a couple of weeks to reassess her glaucoma.  She denies palpitations, chest pain, shortness of breath, lightheadedness/dizziness, and syncope.  She still seems a little sleepy at times.  She also has intermittent edema for which she uses compression stockings.  She tries to stay well-hydrated.  She has a good appetite.  She has not fallen and is able to complete her ADLs.  --------------------------------------------------------------------------------------------------  Cardiovascular History & Procedures: Cardiovascular Problems:  1st and 2nd degree AV block  Risk Factors:  Hypertension and age greater than 39  Cath/PCI:  None  CV Surgery:  None  EP Procedures and Devices:  14-day event monitor (01/25/2020): Sinus rhythm with multiple episodes of second-degree AV block (predominantly Mobitz type I though a few episodes of Mobitz type II cannot be excluded). Longest RR interval was 4.1 seconds. Rare PACs and PVCs also noted.  Non-Invasive Evaluation(s):  None  Past medical and surgical history were reviewed and updated in EPIC.  Current Meds  Medication Sig  . acetaminophen (TYLENOL) 650 MG CR tablet Take 650 mg by mouth every 8 (eight) hours as needed for pain.  Marland Kitchen ammonium lactate  (AMLACTIN) 12 % cream Apply topically.  Marland Kitchen aspirin 81 MG EC tablet Take by mouth.  . brimonidine (ALPHAGAN) 0.15 % ophthalmic solution   . COD LIVER OIL PO Take by mouth.  . felodipine (PLENDIL) 10 MG 24 hr tablet Take by mouth.  . hydrochlorothiazide (MICROZIDE) 12.5 MG capsule TAKE 1 CAPSULE BY MOUTH EVERY MORNING FOR HIGH BLOOD PRESSURE  . Multiple Vitamins-Minerals (ONE-A-DAY WOMENS PO) Take by mouth.  . Travoprost, BAK Free, (TRAVATAN) 0.004 % SOLN ophthalmic solution INSTILL 1 DROP BOTH EYES AT NIGHTTIME    Allergies: Patient has no known allergies.  Social History   Tobacco Use  . Smoking status: Never Smoker  . Smokeless tobacco: Never Used  Vaping Use  . Vaping Use: Never used  Substance Use Topics  . Alcohol use: No  . Drug use: No    Family History  Problem Relation Age of Onset  . Hypertension Mother   . Heart disease Neg Hx     Review of Systems: A 12-system review of systems was performed and was negative except as noted in the HPI.  --------------------------------------------------------------------------------------------------  Physical Exam: BP 98/60 (BP Location: Right Arm, Patient Position: Sitting, Cuff Size: Normal)   Pulse (!) 51   Ht 5' (1.524 m)   Wt 97 lb 2 oz (44.1 kg)   SpO2 95%   BMI 18.97 kg/m   General: NAD.  Accompanied by her daughter. Lungs: Clear to auscultation bilaterally. Heart: Bradycardic but regular without murmurs. Abdomen: Soft, nontender, nondistended. Extremities: No lower extremity edema with compression stockings in place.  EKG: Sinus bradycardia with marked first-degree AV block (PR interval 390 ms) and left anterior fascicular block.  PR interval slightly longer compared to  prior tracing on 01/25/2020.  Otherwise, no significant interval change.  --------------------------------------------------------------------------------------------------  ASSESSMENT AND PLAN: Sinus bradycardia with first-degree and  second-degree AV block: Ms. Godfrey remains asymptomatic other than some occasional fatigue.  She has not had any further palpitations.  Timolol eyedrops were stopped about 1.5 weeks ago with plans to reevaluate glaucoma in a couple of weeks by her optometrist.  Given lack of symptoms, we will continue to monitor her conduction disease closely.  If it worsens further or she develops symptoms, we will refer her to EP for consideration of pacing.  I advised Ms. Mclinden and her daughter to seek immediate medical attention for acute worsening of symptoms including lightheadedness, syncope, shortness of breath, and chest pain.  We will proceed with echocardiogram scheduled for next week to assess for structural abnormalities.  Hypertension: Blood pressure borderline low today.  I recommend decreasing felodipine to 5 mg daily.  We will continue HCTZ 12.5 mg daily.  I also recommend discontinuation of aspirin 81 mg daily given lack of established ASCVD, advanced age, and elevated fall risk.  Ms. Sanjose wishes to discuss this with her PCP, which is reasonable.  Follow-up: Return to clinic in 3 months.  Yvonne Kendall, MD 02/22/2020 8:20 AM

## 2020-02-22 ENCOUNTER — Encounter: Payer: Self-pay | Admitting: Internal Medicine

## 2020-02-22 ENCOUNTER — Other Ambulatory Visit: Payer: Self-pay

## 2020-02-22 ENCOUNTER — Ambulatory Visit: Payer: PPO | Admitting: Internal Medicine

## 2020-02-22 VITALS — BP 98/60 | HR 51 | Ht 60.0 in | Wt 97.1 lb

## 2020-02-22 DIAGNOSIS — I440 Atrioventricular block, first degree: Secondary | ICD-10-CM | POA: Diagnosis not present

## 2020-02-22 DIAGNOSIS — I441 Atrioventricular block, second degree: Secondary | ICD-10-CM | POA: Diagnosis not present

## 2020-02-22 DIAGNOSIS — R001 Bradycardia, unspecified: Secondary | ICD-10-CM | POA: Diagnosis not present

## 2020-02-22 DIAGNOSIS — I10 Essential (primary) hypertension: Secondary | ICD-10-CM

## 2020-02-22 MED ORDER — FELODIPINE ER 5 MG PO TB24: 5.0000 mg | ORAL_TABLET | Freq: Every day | ORAL | 1 refills | Status: DC

## 2020-02-22 NOTE — Patient Instructions (Signed)
Medication Instructions:  Your physician has recommended you make the following change in your medication:  1- DECREASE Felodipine to 5 mg by mouth once a day. 2- STOP Aspirin once you speak to your Primary Care Provider.  *If you need a refill on your cardiac medications before your next appointment, please call your pharmacy*  Follow-Up: At [facility], you and your health needs are our priority.  As part of our continuing mission to provide you with exceptional heart care, we have created designated Provider Care Teams.  These Care Teams include your primary Cardiologist (physician) and Advanced Practice Providers (APPs -  Physician Assistants and Nurse Practitioners) who all work together to provide you with the care you need, when you need it.  We recommend signing up for the patient portal called "MyChart".  Sign up information is provided on this After Visit Summary.  MyChart is used to connect with patients for Virtual Visits (Telemedicine).  Patients are able to view lab/test results, encounter notes, upcoming appointments, etc.  Non-urgent messages can be sent to your provider as well.   To learn more about what you can do with MyChart, go to [email or web address].    Your next appointment:   3 month(s)  The format for your next appointment:   In Person  Provider:   You may see DR Cristal Deer END or one of the following Advanced Practice Providers on your designated Care Team:    Nicolasa Ducking, NP  Eula Listen, PA-C  Marisue Ivan, PA-C  Cadence [place], [place]  Gillian Shields, NP

## 2020-02-27 DIAGNOSIS — Z0000 Encounter for general adult medical examination without abnormal findings: Secondary | ICD-10-CM | POA: Diagnosis not present

## 2020-02-29 ENCOUNTER — Ambulatory Visit (INDEPENDENT_AMBULATORY_CARE_PROVIDER_SITE_OTHER): Payer: PPO

## 2020-02-29 ENCOUNTER — Other Ambulatory Visit: Payer: Self-pay

## 2020-02-29 DIAGNOSIS — R011 Cardiac murmur, unspecified: Secondary | ICD-10-CM | POA: Diagnosis not present

## 2020-02-29 LAB — ECHOCARDIOGRAM COMPLETE
AR max vel: 1.63 cm2
AV Area VTI: 1.7 cm2
AV Area mean vel: 1.63 cm2
AV Mean grad: 4 mmHg
AV Peak grad: 8.5 mmHg
Ao pk vel: 1.46 m/s
Area-P 1/2: 4.12 cm2
Calc EF: 56.3 %
P 1/2 time: 738 msec
S' Lateral: 2.6 cm
Single Plane A2C EF: 53.2 %
Single Plane A4C EF: 56.5 %

## 2020-03-08 ENCOUNTER — Ambulatory Visit: Payer: PPO | Admitting: Physician Assistant

## 2020-03-20 DIAGNOSIS — H40153 Residual stage of open-angle glaucoma, bilateral: Secondary | ICD-10-CM | POA: Diagnosis not present

## 2020-06-04 NOTE — Progress Notes (Signed)
Follow-up Outpatient Visit Date: 06/06/2020  Primary Care Provider: Center, [facility] [address] [place] [address]  Chief Complaint: Fatigue  HPI:  Cassandra Patterson is a 87 y.o. female with history of incidentally noted second-degree AV block during work-up of palpitations, hypertension, and glaucoma, who presents for follow-up of heart block.  I last saw her in 02/2020, at which time Cassandra Patterson was feeling a little better after discontinuation of timolol.  She denied significant lightheadedness and syncope.  We agreed to decrease felodipine to 5 mg daily to see if this would help improve her leg edema.  I also suggested discontinuation of low-dose aspirin for primary prevention.    Today, Cassandra Patterson' only complaint is of daytime somnolence.  She often naps during the day but wakes up multiple times at night.  She denies chest pain, shortness of breath, palpitations, lightheadedness, and edema.  Her daughter reports that home systolic blood pressures are usually 115-140 mmHg.  --------------------------------------------------------------------------------------------------  Cardiovascular History & Procedures: Cardiovascular Problems:  1st and 2nd degree AV block  Risk Factors:  Hypertension and age greater than 49  Cath/PCI:  None  CV Surgery:  None  EP Procedures and Devices:  14-day event monitor (01/25/2020): Sinus rhythm with multiple episodes of second-degree AV block (predominantly Mobitz type I though a few episodes of Mobitz type II cannot be excluded). Longest RR interval was 4.1 seconds. Rare PACs and PVCs also noted.  Past medical and surgical history were reviewed and updated in EPIC.  Current Meds  Medication Sig  . acetaminophen (TYLENOL) 650 MG CR tablet Take 650 mg by mouth every 8 (eight) hours as needed for pain.  Marland Kitchen ammonium lactate (AMLACTIN) 12 % cream Apply topically as needed.  . brimonidine (ALPHAGAN) 0.15 % ophthalmic  solution Place 1 drop into both eyes 2 (two) times daily.  . COD LIVER OIL PO Take by mouth as needed.  . felodipine (PLENDIL) 5 MG 24 hr tablet Take 1 tablet (5 mg total) by mouth daily.  . hydrochlorothiazide (MICROZIDE) 12.5 MG capsule TAKE 1 CAPSULE BY MOUTH EVERY MORNING FOR HIGH BLOOD PRESSURE  . Multiple Vitamins-Minerals (ONE-A-DAY WOMENS PO) Take 1 tablet by mouth daily.  . Travoprost, BAK Free, (TRAVATAN) 0.004 % SOLN ophthalmic solution INSTILL 1 DROP BOTH EYES AT NIGHTTIME    Allergies: Patient has no known allergies.  Social History   Tobacco Use  . Smoking status: Never Smoker  . Smokeless tobacco: Never Used  Vaping Use  . Vaping Use: Never used  Substance Use Topics  . Alcohol use: No  . Drug use: No    Family History  Problem Relation Age of Onset  . Hypertension Mother   . Heart disease Neg Hx     Review of Systems: A 12-system review of systems was performed and was negative except as noted in the HPI.  --------------------------------------------------------------------------------------------------  Physical Exam: BP 100/60 (BP Location: Right Arm, Patient Position: Sitting, Cuff Size: Normal)   Pulse 64   Ht 5\' 3"  (1.6 m)   Wt 95 lb (43.1 kg)   BMI 16.83 kg/m   General:  NAD. Neck: No JVD or HJR. Lungs: Clear to auscultation bilaterally without wheezes or crackles. Heart: Bradycardic with occasional extrasystoles.  No murmurs. Abdomen: Soft, nontender, nondistended. Extremities: No lower extremity edema.  EKG:  Sinus bradycardia with Mobitz type 1 second degree AV block, LAFB, and poor R wave progression.  Compared with prior tracing from 02/22/2020, second degree AV block has replaced  first degree AV block.  --------------------------------------------------------------------------------------------------  ASSESSMENT AND PLAN: Mobitz type 1 second degree AV block: This has been previously noted on ambulatory cardiac monitoring.  Overall, Ms.  Patterson has stable symptom, complaining only of daytime somnolence.  While heart block could be contributing to fatigue, her age and poor sleep hygiene are likely playing a role as well.  We discussed pacemaker placement if her symptoms or AV block worsen, but Cassandra Patterson and her daughter are not interested at this time.  AV-nodal blocking agents should continue to be avoided.  Hypertension: Blood pressure is low-normal today, though daughter reports typically higher readings at home.  We will continue her current regimen of felodipine and HCTZ.  If BP is frequently at/below 100/60, Cassandra Patterson' daughter should reach out to [place] to discuss decreasing her BP medications.  Follow-up: Return to clinic in 6 months.  Yvonne Kendall, MD 06/06/2020 10:41 AM

## 2020-06-06 ENCOUNTER — Ambulatory Visit (INDEPENDENT_AMBULATORY_CARE_PROVIDER_SITE_OTHER): Payer: PPO | Admitting: Internal Medicine

## 2020-06-06 ENCOUNTER — Encounter: Payer: Self-pay | Admitting: Internal Medicine

## 2020-06-06 ENCOUNTER — Other Ambulatory Visit: Payer: Self-pay

## 2020-06-06 VITALS — BP 100/60 | HR 48 | Ht 63.0 in | Wt 95.0 lb

## 2020-06-06 DIAGNOSIS — I441 Atrioventricular block, second degree: Secondary | ICD-10-CM | POA: Diagnosis not present

## 2020-06-06 DIAGNOSIS — I10 Essential (primary) hypertension: Secondary | ICD-10-CM

## 2020-06-06 NOTE — Patient Instructions (Signed)
Medication Instructions:  Your physician recommends that you continue on your current medications as directed. Please refer to the Current Medication list given to you today.  *If you need a refill on your cardiac medications before your next appointment, please call your pharmacy*   Lab Work: None ordered If you have labs (blood work) drawn today and your tests are completely normal, you will receive your results only by: Marland Kitchen MyChart Message (if you have MyChart) OR . A paper copy in the mail If you have any lab test that is abnormal or we need to change your treatment, we will call you to review the results.   Testing/Procedures: None ordered   Follow-Up: At [facility], you and your health needs are our priority.  As part of our continuing mission to provide you with exceptional heart care, we have created designated Provider Care Teams.  These Care Teams include your primary Cardiologist (physician) and Advanced Practice Providers (APPs -  Physician Assistants and Nurse Practitioners) who all work together to provide you with the care you need, when you need it.  We recommend signing up for the patient portal called "MyChart".  Sign up information is provided on this After Visit Summary.  MyChart is used to connect with patients for Virtual Visits (Telemedicine).  Patients are able to view lab/test results, encounter notes, upcoming appointments, etc.  Non-urgent messages can be sent to your provider as well.   To learn more about what you can do with MyChart, go to [email or web address].    Your next appointment:   Your physician wants you to follow-up in: 6 months You will receive a reminder letter in the mail two months in advance. If you don't receive a letter, please call our office to schedule the follow-up appointment.   The format for your next appointment:   In Person  Provider:   You may see Yvonne Kendall, MD or one of the following Advanced Practice Providers on  your designated Care Team:    Nicolasa Ducking, NP  Eula Listen, PA-C  Marisue Ivan, PA-C  Cadence [place], [place]  Gillian Shields, NP    Other Instructions Your physician has requested that you regularly monitor and record your blood pressure readings at home. Please use the same machine at the same time of day to check your readings and record them. Call the office if the readings are consistently low 100/60 or less.

## 2020-07-25 DIAGNOSIS — H40153 Residual stage of open-angle glaucoma, bilateral: Secondary | ICD-10-CM | POA: Diagnosis not present

## 2020-08-09 DIAGNOSIS — H6980 Other specified disorders of Eustachian tube, unspecified ear: Secondary | ICD-10-CM | POA: Diagnosis not present

## 2020-08-11 ENCOUNTER — Other Ambulatory Visit: Payer: Self-pay | Admitting: Internal Medicine

## 2020-11-29 DIAGNOSIS — H40153 Residual stage of open-angle glaucoma, bilateral: Secondary | ICD-10-CM | POA: Diagnosis not present

## 2021-01-06 ENCOUNTER — Emergency Department: Payer: PPO

## 2021-01-06 ENCOUNTER — Inpatient Hospital Stay
Admission: EM | Admit: 2021-01-06 | Discharge: 2021-01-08 | DRG: 309 | Disposition: A | Discharge status: Home Health | Payer: PPO | Attending: Internal Medicine | Admitting: Internal Medicine

## 2021-01-06 ENCOUNTER — Other Ambulatory Visit: Payer: Self-pay

## 2021-01-06 DIAGNOSIS — I441 Atrioventricular block, second degree: Secondary | ICD-10-CM | POA: Diagnosis present

## 2021-01-06 DIAGNOSIS — I959 Hypotension, unspecified: Secondary | ICD-10-CM | POA: Diagnosis not present

## 2021-01-06 DIAGNOSIS — R29810 Facial weakness: Secondary | ICD-10-CM | POA: Diagnosis not present

## 2021-01-06 DIAGNOSIS — I10 Essential (primary) hypertension: Secondary | ICD-10-CM | POA: Diagnosis not present

## 2021-01-06 DIAGNOSIS — R29818 Other symptoms and signs involving the nervous system: Secondary | ICD-10-CM | POA: Diagnosis not present

## 2021-01-06 DIAGNOSIS — G459 Transient cerebral ischemic attack, unspecified: Secondary | ICD-10-CM | POA: Diagnosis not present

## 2021-01-06 DIAGNOSIS — R1310 Dysphagia, unspecified: Secondary | ICD-10-CM | POA: Diagnosis present

## 2021-01-06 DIAGNOSIS — R471 Dysarthria and anarthria: Secondary | ICD-10-CM | POA: Diagnosis not present

## 2021-01-06 DIAGNOSIS — I444 Left anterior fascicular block: Secondary | ICD-10-CM | POA: Diagnosis not present

## 2021-01-06 DIAGNOSIS — Z79899 Other long term (current) drug therapy: Secondary | ICD-10-CM | POA: Diagnosis not present

## 2021-01-06 DIAGNOSIS — R4701 Aphasia: Secondary | ICD-10-CM | POA: Diagnosis not present

## 2021-01-06 DIAGNOSIS — Z20822 Contact with and (suspected) exposure to covid-19: Secondary | ICD-10-CM | POA: Diagnosis present

## 2021-01-06 DIAGNOSIS — R55 Syncope and collapse: Secondary | ICD-10-CM | POA: Diagnosis not present

## 2021-01-06 DIAGNOSIS — I517 Cardiomegaly: Secondary | ICD-10-CM | POA: Diagnosis not present

## 2021-01-06 DIAGNOSIS — R4702 Dysphasia: Secondary | ICD-10-CM

## 2021-01-06 DIAGNOSIS — Z8249 Family history of ischemic heart disease and other diseases of the circulatory system: Secondary | ICD-10-CM | POA: Diagnosis not present

## 2021-01-06 DIAGNOSIS — R531 Weakness: Secondary | ICD-10-CM | POA: Diagnosis not present

## 2021-01-06 DIAGNOSIS — R4781 Slurred speech: Secondary | ICD-10-CM | POA: Diagnosis not present

## 2021-01-06 DIAGNOSIS — R0902 Hypoxemia: Secondary | ICD-10-CM | POA: Diagnosis not present

## 2021-01-06 DIAGNOSIS — H409 Unspecified glaucoma: Secondary | ICD-10-CM | POA: Diagnosis not present

## 2021-01-06 DIAGNOSIS — R001 Bradycardia, unspecified: Principal | ICD-10-CM

## 2021-01-06 DIAGNOSIS — R0689 Other abnormalities of breathing: Secondary | ICD-10-CM | POA: Diagnosis not present

## 2021-01-06 DIAGNOSIS — I6523 Occlusion and stenosis of bilateral carotid arteries: Secondary | ICD-10-CM | POA: Diagnosis not present

## 2021-01-06 LAB — CBC WITH DIFFERENTIAL/PLATELET
Abs Immature Granulocytes: 0.03 10*3/uL (ref 0.00–0.07)
Basophils Absolute: 0 10*3/uL (ref 0.0–0.1)
Basophils Relative: 1 %
Eosinophils Absolute: 0.2 10*3/uL (ref 0.0–0.5)
Eosinophils Relative: 3 %
HCT: 41.2 % (ref 36.0–46.0)
Hemoglobin: 12.7 g/dL (ref 12.0–15.0)
Immature Granulocytes: 1 %
Lymphocytes Relative: 24 %
Lymphs Abs: 1.2 10*3/uL (ref 0.7–4.0)
MCH: 24.9 pg — ABNORMAL LOW (ref 26.0–34.0)
MCHC: 30.8 g/dL (ref 30.0–36.0)
MCV: 80.6 fL (ref 80.0–100.0)
Monocytes Absolute: 0.4 10*3/uL (ref 0.1–1.0)
Monocytes Relative: 8 %
Neutro Abs: 3.2 10*3/uL (ref 1.7–7.7)
Neutrophils Relative %: 63 %
Platelets: 185 10*3/uL (ref 150–400)
RBC: 5.11 MIL/uL (ref 3.87–5.11)
RDW: 13.7 % (ref 11.5–15.5)
WBC: 5.1 10*3/uL (ref 4.0–10.5)
nRBC: 0 % (ref 0.0–0.2)

## 2021-01-06 LAB — COMPREHENSIVE METABOLIC PANEL
ALT: 17 U/L (ref 0–44)
AST: 19 U/L (ref 15–41)
Albumin: 3.2 g/dL — ABNORMAL LOW (ref 3.5–5.0)
Alkaline Phosphatase: 48 U/L (ref 38–126)
Anion gap: 4 — ABNORMAL LOW (ref 5–15)
BUN: 36 mg/dL — ABNORMAL HIGH (ref 8–23)
CO2: 28 mmol/L (ref 22–32)
Calcium: 8.9 mg/dL (ref 8.9–10.3)
Chloride: 106 mmol/L (ref 98–111)
Creatinine, Ser: 0.79 mg/dL (ref 0.44–1.00)
GFR, Estimated: >60 mL/min (ref >60)
Glucose, Bld: 112 mg/dL — ABNORMAL HIGH (ref 70–99)
Potassium: 4.1 mmol/L (ref 3.5–5.1)
Sodium: 138 mmol/L (ref 135–145)
Total Bilirubin: 0.5 mg/dL (ref 0.3–1.2)
Total Protein: 6.1 g/dL — ABNORMAL LOW (ref 6.5–8.1)

## 2021-01-06 LAB — URINALYSIS, ROUTINE W REFLEX MICROSCOPIC
Bilirubin Urine: NEGATIVE
Glucose, UA: NEGATIVE mg/dL
Hgb urine dipstick: NEGATIVE
Ketones, ur: NEGATIVE mg/dL
Nitrite: NEGATIVE
Protein, ur: NEGATIVE mg/dL
Specific Gravity, Urine: 1.016 (ref 1.005–1.030)
pH: 5 (ref 5.0–8.0)

## 2021-01-06 LAB — RESP PANEL BY RT-PCR (FLU A&B, COVID) ARPGX2
Influenza A by PCR: NEGATIVE
Influenza B by PCR: NEGATIVE
SARS Coronavirus 2 by RT PCR: NEGATIVE

## 2021-01-06 LAB — TROPONIN I (HIGH SENSITIVITY): Troponin I (High Sensitivity): 7 ng/L (ref <18)

## 2021-01-06 MED ORDER — LACTATED RINGERS IV BOLUS
500.0000 mL | Freq: Once | INTRAVENOUS | Status: AC
  Administered 2021-01-06: 500 mL via INTRAVENOUS

## 2021-01-06 NOTE — ED Provider Notes (Signed)
[facility] Emergency Department Provider Note   ____________________________________________   Event Date/Time   First MD Initiated Contact with Patient 01/06/21 1952     (approximate)  I have reviewed the triage vital signs and the nursing notes.   HISTORY  Chief Complaint Hypotension and Aphasia    HPI Cassandra Patterson is a 87 y.o. female with past medical history of hypertension and Mobitz type I second-degree heart block who presents to the ED for aphasia.  Patient reportedly had sudden onset of slurred speech about 1 hour prior to arrival.  She also reports feeling lightheaded at this time and thinks she may have passed out.  Upon EMS arrival, patient's speech seemed to improve and she currently denies any numbness or weakness.  She has not had any problems with her vision today.  She states she has been feeling well with no fevers, cough, chest pain, shortness of breath, vomiting, diarrhea, or dysuria.  EMS reports that patient noted to be bradycardic in the 40s during transport although family had reported her heart rate is usually in the 50s.  She was given a small bolus of IV fluids after being found to be borderline hypotensive with EMS.        Past Medical History:  Diagnosis Date   Cataracts, bilateral    Glaucoma    Hypertension     Patient Active Problem List   Diagnosis Date Noted   TIA (transient ischemic attack) 01/06/2021   Mobitz type 1 second degree AV block 02/22/2020   Palpitations 01/26/2020   Heart murmur 01/26/2020   Essential hypertension 01/26/2020   Sinus bradycardia 01/26/2020   First degree AV block 01/26/2020    Past Surgical History:  Procedure Laterality Date   CATARACT EXTRACTION      Prior to Admission medications   Medication Sig Start Date End Date Taking? Authorizing Provider  acetaminophen (TYLENOL) 650 MG CR tablet Take 650 mg by mouth every 8 (eight) hours as needed for pain.    [provider]  ammonium lactate (AMLACTIN) 12 % cream Apply topically as needed. 07/24/19   [provider]  brimonidine (ALPHAGAN) 0.15 % ophthalmic solution Place 1 drop into both eyes 2 (two) times daily. 12/12/19   [provider]  COD LIVER OIL PO Take by mouth as needed.    [provider]  felodipine (PLENDIL) 5 MG 24 hr tablet TAKE 1 TABLET(5 MG) BY MOUTH DAILY 08/13/20   End, Cristal Deer, MD  hydrochlorothiazide (MICROZIDE) 12.5 MG capsule TAKE 1 CAPSULE BY MOUTH EVERY MORNING FOR HIGH BLOOD PRESSURE 10/21/19   [provider]  Multiple Vitamins-Minerals (ONE-A-DAY WOMENS PO) Take 1 tablet by mouth daily.    [provider]  Travoprost, BAK Free, (TRAVATAN) 0.004 % SOLN ophthalmic solution INSTILL 1 DROP BOTH EYES AT NIGHTTIME 12/10/19   [provider]    Allergies Patient has no known allergies.  Family History  Problem Relation Age of Onset   Hypertension Mother    Heart disease Neg Hx     Social History Social History   Tobacco Use   Smoking status: Never   Smokeless tobacco: Never  Vaping Use   Vaping Use: Never used  Substance Use Topics   Alcohol use: No   Drug use: No    Review of Systems  Constitutional: No fever/chills Eyes: No visual changes. ENT: No sore throat. Cardiovascular: Denies chest pain.  Positive for lightheadedness and near syncope. Respiratory: Denies shortness of breath.  Gastrointestinal: No abdominal pain.  No nausea, no vomiting.  No diarrhea.  No constipation. Genitourinary: Negative for dysuria. Musculoskeletal: Negative for back pain. Skin: Negative for rash. Neurological: Negative for headaches, focal weakness or numbness.  Positive for aphasia.  ____________________________________________   PHYSICAL EXAM:  VITAL SIGNS: ED Triage Vitals  Enc Vitals Group     BP      Pulse      Resp      Temp      Temp src      SpO2      Weight      Height      Head Circumference       Peak Flow      Pain Score      Pain Loc      Pain Edu?      Excl. in GC?     Constitutional: Alert and oriented to person, place, time, and situation. Eyes: Conjunctivae are normal.  Pupils equal, round, and reactive to light bilaterally. Head: Atraumatic. Nose: No congestion/rhinnorhea. Mouth/Throat: Mucous membranes are moist. Neck: Normal ROM Cardiovascular: Bradycardic, regular rhythm. Grossly normal heart sounds.  2+ radial pulses bilaterally. Respiratory: Normal respiratory effort.  No retractions. Lungs CTAB. Gastrointestinal: Soft and nontender. No distention. Genitourinary: deferred Musculoskeletal: No lower extremity tenderness nor edema. Neurologic:  Normal speech and language. No gross focal neurologic deficits are appreciated. Skin:  Skin is warm, dry and intact. No rash noted. Psychiatric: Mood and affect are normal. Speech and behavior are normal.  ____________________________________________   LABS (all labs ordered are listed, but only abnormal results are displayed)  Labs Reviewed  CBC WITH DIFFERENTIAL/PLATELET - Abnormal; Notable for the following components:      Result Value   MCH 24.9 (*)    All other components within normal limits  COMPREHENSIVE METABOLIC PANEL - Abnormal; Notable for the following components:   Glucose, Bld 112 (*)    BUN 36 (*)    Total Protein 6.1 (*)    Albumin 3.2 (*)    Anion gap 4 (*)    All other components within normal limits  URINALYSIS, ROUTINE W REFLEX MICROSCOPIC - Abnormal; Notable for the following components:   Color, Urine YELLOW (*)    APPearance HAZY (*)    Leukocytes,Ua TRACE (*)    Bacteria, UA RARE (*)    All other components within normal limits  RESP PANEL BY RT-PCR (FLU A&B, COVID) ARPGX2  TROPONIN I (HIGH SENSITIVITY)  TROPONIN I (HIGH SENSITIVITY)   ____________________________________________  EKG  ED ECG REPORT I, Chesley Noon, the attending physician, personally viewed and interpreted  this ECG.   Date: 01/06/2021  EKG Time: 20:43  Rate: 48  Rhythm: sinus bradycardia  Axis: LAD  Intervals:left anterior fascicular block  ST&T Change: None   PROCEDURES  Procedure(s) performed (including Critical Care):  Procedures   ____________________________________________   INITIAL IMPRESSION / ASSESSMENT AND PLAN / ED COURSE      87 year old female with past medical history of hypertension and Mobitz type I second-degree heart block who presents to the ED complaining of episode of aphasia that was associated with lightheadedness and near syncope.  She was borderline hypotensive with EMS but blood pressure seems to have improved here in the ED.  She remains bradycardic and we will observe on cardiac monitor for any evidence of complete heart block that could have contributed to her episode.  She has no focal neurologic deficits on exam, we will check CT head due to  her previous aphasia.  Labs and UA are pending at this time.  CT head is negative for acute process, labs are unremarkable and UA shows no signs of infection.  Patient sister is now at bedside and states she witnessed the earlier episode.  She reports that patient had right-sided facial droop and slurred speech, which is concerning for TIA.  Patient remains awake and alert at this time with no focal neurologic deficits.  Bradycardia discussed with Dr. Mariah Milling of cardiology, no acute intervention needed given blood pressure is improving with IV fluids and bradycardia appears to be chronic.  Given concern for TIA, case discussed with hospitalist for admission.      ____________________________________________   FINAL CLINICAL IMPRESSION(S) / ED DIAGNOSES  Final diagnoses:  TIA (transient ischemic attack)  Syncope, unspecified syncope type     ED Discharge Orders     None        Note:  This document was prepared using Dragon voice recognition software and may include unintentional dictation errors.     Chesley Noon, MD 01/07/21 [phone]

## 2021-01-06 NOTE — ED Triage Notes (Signed)
Pt presents to ER via ems from home.  EMS called d/t having some slurred speech at home.  Unkown when LKW time was.  When ems arrived, pt's speech was normal.  Pt is currently A&O x3 at this time.  Ems reported pt's BP was between 70s-80s systolic in transport with HR of around 50.  CBG 134 per ems.  18 G LAC with IVF started by ems.

## 2021-01-07 ENCOUNTER — Observation Stay: Payer: PPO

## 2021-01-07 ENCOUNTER — Observation Stay (HOSPITAL_COMMUNITY)
Admit: 2021-01-07 | Discharge: 2021-01-07 | Disposition: A | Discharge status: Home / Self Care | Payer: PPO | Attending: Family Medicine | Admitting: Family Medicine

## 2021-01-07 DIAGNOSIS — R29818 Other symptoms and signs involving the nervous system: Secondary | ICD-10-CM | POA: Diagnosis not present

## 2021-01-07 DIAGNOSIS — G459 Transient cerebral ischemic attack, unspecified: Secondary | ICD-10-CM | POA: Diagnosis not present

## 2021-01-07 DIAGNOSIS — I10 Essential (primary) hypertension: Secondary | ICD-10-CM | POA: Diagnosis not present

## 2021-01-07 DIAGNOSIS — I6523 Occlusion and stenosis of bilateral carotid arteries: Secondary | ICD-10-CM | POA: Diagnosis not present

## 2021-01-07 DIAGNOSIS — R001 Bradycardia, unspecified: Secondary | ICD-10-CM | POA: Diagnosis not present

## 2021-01-07 LAB — TSH: TSH: 0.973 u[IU]/mL (ref 0.350–4.500)

## 2021-01-07 LAB — LIPID PANEL
Cholesterol: 156 mg/dL (ref 0–200)
HDL: 67 mg/dL (ref >40)
LDL Cholesterol: 83 mg/dL (ref 0–99)
Total CHOL/HDL Ratio: 2.3 RATIO
Triglycerides: 30 mg/dL (ref <150)
VLDL: 6 mg/dL (ref 0–40)

## 2021-01-07 LAB — ECHOCARDIOGRAM LIMITED BUBBLE STUDY: S' Lateral: 2.19 cm

## 2021-01-07 LAB — TROPONIN I (HIGH SENSITIVITY): Troponin I (High Sensitivity): 8 ng/L (ref <18)

## 2021-01-07 LAB — HEMOGLOBIN A1C
Hgb A1c MFr Bld: 5.6 % (ref 4.8–5.6)
Mean Plasma Glucose: 114.02 mg/dL

## 2021-01-07 LAB — MAGNESIUM: Magnesium: 1.8 mg/dL (ref 1.7–2.4)

## 2021-01-07 MED ORDER — HYDROCHLOROTHIAZIDE 12.5 MG PO TABS
12.5000 mg | ORAL_TABLET | Freq: Every day | ORAL | Status: DC
  Administered 2021-01-07: 12.5 mg via ORAL
  Filled 2021-01-07: qty 1

## 2021-01-07 MED ORDER — ATORVASTATIN CALCIUM 20 MG PO TABS
10.0000 mg | ORAL_TABLET | Freq: Every day | ORAL | Status: DC
  Administered 2021-01-07 – 2021-01-08 (×2): 10 mg via ORAL
  Filled 2021-01-07 (×3): qty 1

## 2021-01-07 MED ORDER — SODIUM CHLORIDE 0.9 % IV SOLN: INTRAVENOUS | Status: DC

## 2021-01-07 MED ORDER — ACETAMINOPHEN 325 MG PO TABS: 650.0000 mg | ORAL_TABLET | ORAL | Status: DC | PRN

## 2021-01-07 MED ORDER — ADULT MULTIVITAMIN W/MINERALS CH
ORAL_TABLET | Freq: Every day | ORAL | Status: DC
  Administered 2021-01-07 – 2021-01-08 (×2): 1 via ORAL
  Filled 2021-01-07 (×2): qty 1

## 2021-01-07 MED ORDER — ASPIRIN EC 81 MG PO TBEC
81.0000 mg | DELAYED_RELEASE_TABLET | Freq: Every day | ORAL | Status: DC
Start: 2021-01-07 — End: 2021-01-08
  Administered 2021-01-07 – 2021-01-08 (×3): 81 mg via ORAL
  Filled 2021-01-07 (×3): qty 1

## 2021-01-07 MED ORDER — LATANOPROST 0.005 % OP SOLN
1.0000 [drp] | Freq: Every day | OPHTHALMIC | Status: DC
  Administered 2021-01-07 (×2): 1 [drp] via OPHTHALMIC
  Filled 2021-01-07: qty 2.5

## 2021-01-07 MED ORDER — AMMONIUM LACTATE 12 % EX LOTN
TOPICAL_LOTION | CUTANEOUS | Status: DC | PRN
  Filled 2021-01-07: qty 400

## 2021-01-07 MED ORDER — ACETAMINOPHEN 160 MG/5ML PO SOLN
650.0000 mg | ORAL | Status: DC | PRN
  Filled 2021-01-07: qty 20.3

## 2021-01-07 MED ORDER — SENNOSIDES-DOCUSATE SODIUM 8.6-50 MG PO TABS: 1.0000 | ORAL_TABLET | Freq: Every evening | ORAL | Status: DC | PRN

## 2021-01-07 MED ORDER — FELODIPINE ER 5 MG PO TB24
5.0000 mg | ORAL_TABLET | Freq: Every day | ORAL | Status: DC
  Administered 2021-01-07 – 2021-01-08 (×2): 5 mg via ORAL
  Filled 2021-01-07 (×2): qty 1

## 2021-01-07 MED ORDER — STROKE: EARLY STAGES OF RECOVERY BOOK: Freq: Once | Status: DC

## 2021-01-07 MED ORDER — ENOXAPARIN SODIUM 40 MG/0.4ML IJ SOSY
40.0000 mg | PREFILLED_SYRINGE | INTRAMUSCULAR | Status: DC
  Administered 2021-01-07 – 2021-01-08 (×2): 40 mg via SUBCUTANEOUS
  Filled 2021-01-07 (×2): qty 0.4

## 2021-01-07 MED ORDER — BRIMONIDINE TARTRATE 0.15 % OP SOLN
1.0000 [drp] | Freq: Two times a day (BID) | OPHTHALMIC | Status: DC
  Administered 2021-01-07 – 2021-01-08 (×3): 1 [drp] via OPHTHALMIC
  Filled 2021-01-07: qty 5

## 2021-01-07 MED ORDER — ACETAMINOPHEN 650 MG RE SUPP: 650.0000 mg | RECTAL | Status: DC | PRN

## 2021-01-07 NOTE — Evaluation (Signed)
Speech Language Pathology Evaluation Patient Details Name: Cassandra Patterson MRN: [id number] DOB: December 03, 1928 Today's Date: 01/07/2021 Time: 2778-2423 SLP Time Calculation (min) (ACUTE ONLY): 25 min  Problem List:  Patient Active Problem List   Diagnosis Date Noted   TIA (transient ischemic attack) 01/06/2021   Mobitz type 1 second degree AV block 02/22/2020   Palpitations 01/26/2020   Heart murmur 01/26/2020   Essential hypertension 01/26/2020   Sinus bradycardia 01/26/2020   First degree AV block 01/26/2020   Past Medical History:  Past Medical History:  Diagnosis Date   Cataracts, bilateral    Glaucoma    Hypertension    Past Surgical History:  Past Surgical History:  Procedure Laterality Date   CATARACT EXTRACTION     HPI:  Per Physician's H&P, "Cassandra Patterson is a 87 y.o. African-American female with medical history significant for essential hypertension and glaucoma, who presented to the ER with acute onset of right facial droop with drooling associated expressive dysphasia and dysarthria, noted by her sister earlier this evening.  She denies any paresthesias or focal muscle weakness or abnormal gait.  No presyncope or syncope.  No headache or dizziness or blurred vision.  No tinnitus or vertigo.  She denies any chest pain or palpitations.  No nausea or vomiting or abdominal pain.  No urinary or stool incontinence.  No witnessed seizures.     ED Course: Upon presenting to the emergency room heart rate was 52 and has been as low as 40 with a blood pressure of 91/61 that was later up to 118/62 with otherwise normal vital signs.  Labs revealed a BUN of 36 with albumin of 3.2 and total bili 6.1 with otherwise unremarkable CMP.  CBC was within normal.  UA showed 6-10 WBCs with rare bacteria and trace leukocytes.  EKG as reviewed by me : EKG showed sinus bradycardia with a rate 48 with poor R wave progression left anterior fascicular block with prolonged PR interval  Imaging:  Noncontrasted CT scan revealed the following:  1. No evidence of cortical based infarct or hemorrhage , no  hyperdense central vasculature.  2. Trace air in the right cavernous sinus and additional intravenous  air in the left temporal scalp. This likely is iatrogenic. No  evidence of skull base fracture is seen. No air is seen in the  cortical veins of either hemisphere.  3. Chronic change.  Chest x-ray showed aortic atherosclerosis and cardiomegaly with no acute cardiopulmonary disease.    The patient was given 500 mill IV lactated ringer bolus.  She will be admitted to an observation medical telemetry bed for further evaluation and management."   Assessment / Plan / Recommendation Clinical Impression  Pt seen for speech/language evaluation. Evaluation completed via portions of Western Aphasia Battery Revised (Bedside Record Form) and informal means. Pt A&Ox4. Pt verbalized safe solutions to hypothetical household problems. Pt demonstrated intact functional primary language ability. Speech is fluent, appropriate, and without s/sx dysarthria. Pt HOH and benefited from increased vocal loudness from communication partner for auditory comprehension.   Pt and sister, [name], endorse pt is at baseline for cognitive-linguistic functioning. PTA, pt with assistance with IADLs from sister. Pt did not drive. Sister manages household affairs and medication. Pt and sister deny pt with any further SLP needs.   Based on today's assessment and diagnostic interviewing with pt and sister, pt with wo further SLP services and this time. SLP to sign off. Please reconsult should pt have any additional SLP needs.  Pt, sister, and RN made aware of results, recommendations, and SLP POC. Pt and sister verbalized understanding/agreement.    SLP Assessment  SLP Recommendation/Assessment: Patient does not need any further Speech Lanaguage Pathology Services SLP Visit Diagnosis: Aphasia (R47.01)    Recommendations for follow up  therapy are one component of a multi-disciplinary discharge planning process, led by the attending physician.  Recommendations may be updated based on patient status, additional functional criteria and insurance authorization.    Follow Up Recommendations  No SLP follow up    Assistance Recommended at Discharge  Intermittent Supervision/Assistance  Functional Status Assessment Patient has not had a recent decline in their functional status  Frequency and Duration N/A      SLP Evaluation Cognition  Overall Cognitive Status: Within Functional Limits for tasks assessed Arousal/Alertness: Awake/alert Orientation Level: Oriented X4 Problem Solving: Appears intact (verbal)       Comprehension  Auditory Comprehension Overall Auditory Comprehension: Appears within functional limits for tasks assessed Yes/No Questions: Within Functional Limits Commands: Within Functional Limits Conversation: Complex [facility]) Interfering Components: Hearing EffectiveTechniques: Increased volume;Extra processing time Visual Recognition/Discrimination Discrimination: Within Function Limits Reading Comprehension Reading Status: Not tested    Expression Expression Primary Mode of Expression: Verbal Verbal Expression Overall Verbal Expression: Appears within functional limits for tasks assessed Initiation: No impairment Automatic Speech: Social Response (WFL) Repetition: No impairment Naming: No impairment Pragmatics: No impairment Written Expression Written Expression: Not tested   Oral / Motor  Oral Motor/Sensory Function Overall Oral Motor/Sensory Function: Within functional limits Motor Speech Overall Motor Speech: Appears within functional limits for tasks assessed Respiration: Within functional limits Phonation: Normal Resonance: Within functional limits Articulation: Within functional limitis Intelligibility: Intelligible Motor Planning: Witnin functional limits     Clyde Canterbury, M.S.,  CCC-SLP Speech-Language Pathologist  [facility] [phone] Arnette Felts)          Woodroe Chen 01/07/2021, 9:31 AM

## 2021-01-07 NOTE — Progress Notes (Signed)
*  PRELIMINARY RESULTS* Echocardiogram 2D Echocardiogram has been performed.  Cassandra Patterson [place] 01/07/2021, 11:09 AM

## 2021-01-07 NOTE — Evaluation (Signed)
Physical Therapy Evaluation Patient Details Name: Cassandra Patterson MRN: [id number] DOB: Aug 03, 1928 Today's Date: 01/07/2021  History of Present Illness  Pt is a 87 y/o F who presented to the ED with c/o sudden onset slurred speech but upon EMS arrival pt's speech seemed to improve. MRI reveals no acute infarct. PMH: HTN, mobitz type 1 second degree heart block, B cataracts, glaucoma  Clinical Impression  Pt received in bed with sisters present for session. Prior to admission pt lived alone in her 1 level home with 1 step to enter. Pt's sister lived next door & frequently came over throughout the day; sister assists with bathing, cooking & cleaning. Pt endorses furniture walking but no falls prior to admission. On this date pt initially ambulates without AD but frequently reaching for objects for support. Provided pt with RW & pt able to ambulate 100 ft with RW & CGA with cuing for proper use of AD. Educated pt on importance of use of RW upon d/c & pt & family agreeable. Will continue to follow pt acutely to progress independence with gait & stair negotiation.       Recommendations for follow up therapy are one component of a multi-disciplinary discharge planning process, led by the attending physician.  Recommendations may be updated based on patient status, additional functional criteria and insurance authorization.  Follow Up Recommendations Home health PT    Assistance Recommended at Discharge Frequent or constant Supervision/Assistance  Functional Status Assessment Patient has had a recent decline in their functional status and demonstrates the ability to make significant improvements in function in a reasonable and predictable amount of time.  Equipment Recommendations  None recommended by PT (pt already has RW)    Recommendations for Other Services       Precautions / Restrictions Precautions Precautions: Fall Restrictions Weight Bearing Restrictions: No      Mobility  Bed  Mobility Overal bed mobility: Needs Assistance Bed Mobility: Supine to Sit;Sit to Supine     Supine to sit: Supervision;HOB elevated Sit to supine: Supervision;HOB elevated        Transfers Overall transfer level: Needs assistance Equipment used: None Transfers: Sit to/from Stand Sit to Stand: Min assist           General transfer comment: Pt reaching for counter for support    Ambulation/Gait Ambulation/Gait assistance: Min guard Gait Distance (Feet): 100 Feet Assistive device: Rolling walker (2 wheels) Gait Pattern/deviations: Decreased dorsiflexion - right;Decreased dorsiflexion - left;Decreased step length - left;Decreased step length - right;Decreased stride length Gait velocity: slightly decreased     General Gait Details: Pt initially ambulates in room without AD but frequently reaching for furniture for support with PT educating her on benefits & importance of using RW & providing pt with AD.  Stairs            Wheelchair Mobility    Modified Rankin (Stroke Patients Only)       Balance Overall balance assessment: Needs assistance Sitting-balance support: Feet supported;No upper extremity supported Sitting balance-Leahy Scale: Fair     Standing balance support: No upper extremity supported;During functional activity Standing balance-Leahy Scale: Fair                               Pertinent Vitals/Pain Pain Assessment: No/denies pain    Home Living Family/patient expects to be discharged to:: Private residence Living Arrangements: Other relatives Available Help at Discharge: Family;Available 24 hours/day Type of Home:  House Home Access: Stairs to enter Entrance Stairs-Rails: None Entrance Stairs-Number of Steps: 1   Home Layout: One level Home Equipment: Agricultural consultant (2 wheels)      Prior Function Prior Level of Function : Independent/Modified Independent             Mobility Comments: Pt reports she ambulated  independently without AD but furniture walks, denies falls in the past 6 months. Sister assists with cooking, cleaning & bathing but pt able to toilet independently.       Hand Dominance        Extremity/Trunk Assessment   Upper Extremity Assessment Upper Extremity Assessment: Overall WFL for tasks assessed    Lower Extremity Assessment Lower Extremity Assessment: Generalized weakness       Communication   Communication: HOH  Cognition Arousal/Alertness: Awake/alert Behavior During Therapy: WFL for tasks assessed/performed Overall Cognitive Status: Within Functional Limits for tasks assessed                                          General Comments General comments (skin integrity, edema, etc.): HR 59-68 bpm during session    Exercises     Assessment/Plan    PT Assessment Patient needs continued PT services  PT Problem List Decreased strength;Decreased safety awareness;Decreased mobility;Decreased balance;Decreased knowledge of use of DME;Decreased activity tolerance       PT Treatment Interventions Therapeutic activities;DME instruction;Gait training;Therapeutic exercise;Stair training;Balance training;Functional mobility training;Neuromuscular re-education;Manual techniques;Patient/family education    PT Goals (Current goals can be found in the Care Plan section)  Acute Rehab PT Goals Patient Stated Goal: get better, go home PT Goal Formulation: With patient/family Time For Goal Achievement: 01/21/21 Potential to Achieve Goals: Good    Frequency Min 2X/week   Barriers to discharge        Co-evaluation               AM-PAC PT "6 Clicks" Mobility  Outcome Measure Help needed turning from your back to your side while in a flat bed without using bedrails?: None Help needed moving from lying on your back to sitting on the side of a flat bed without using bedrails?: A Little Help needed moving to and from a bed to a chair (including a  wheelchair)?: A Little Help needed standing up from a chair using your arms (e.g., wheelchair or bedside chair)?: A Little Help needed to walk in hospital room?: A Little Help needed climbing 3-5 steps with a railing? : A Little 6 Click Score: 19    End of Session Equipment Utilized During Treatment: Gait belt Activity Tolerance: Patient tolerated treatment well Patient left: in bed;with call bell/phone within reach;with bed alarm set;with family/visitor present;with nursing/sitter in room Nurse Communication: Mobility status PT Visit Diagnosis: Muscle weakness (generalized) (M62.81);Unsteadiness on feet (R26.81)    Time: 9892-1194 PT Time Calculation (min) (ACUTE ONLY): 19 min   Charges:   PT Evaluation $PT Eval Low Complexity: 1 Low PT Treatments $Therapeutic Activity: 8-22 mins        Aleda Grana, PT, DPT 01/07/21, 4:02 PM   Sandi Mariscal 01/07/2021, 4:00 PM

## 2021-01-07 NOTE — Consult Note (Signed)
Neurology Consultation Reason for Consult: Concern for TIA Requesting Physician: Valente David  CC: "I just did not feel right"  History is obtained from: Patient, chart review and sister at bedside  HPI: Cassandra Patterson is a 87 y.o. female with a past medical history significant for second-degree AV block followed by cardiology, hypertension, glaucoma.  The patient sister was putting her hair in [employer] last night when she asked patient to move her head and found that the patient could no longer respond to her.  She subsequently had a couple minutes of difficulty speaking and then when she started speaking her speech was slurred.  Additionally she had a right facial droop and some drool on the right side, with her head tilted slightly to the right.  She did not lose consciousness, however was found to be bradycardic into the 40s (normally in the 50s) as well as hypotensive for EMS.  When she started speaking she was able to express herself and state for example that she wanted to lay down because she did not feel well.  At baseline, patient's sister has been helping with managing IADLs for many years now, particularly after the patient's husband passed away in 06/15/18.  Her sister manages all of her cooking, bills, managing her doctors appointments etc.  She lays out the patient's clothes for the patient but the patient is able to dress herself, toilet, and ambulate without the assistance of another person.  Patient currently has a slight right nasolabial fold flattening with near symmetric activation which sister reports is her baseline smile  LKW: Approximately 7:30 PM on 11/20 tPA given?: No, symptoms had resolved at the time of presentation Premorbid modified rankin scale: 3     3 - Moderate disability. Requires some help, but able to walk unassisted.  ROS: All other review of systems was negative except as noted in the HPI.  Particular pertinent: No signs or symptoms concerning for bleeding  or infection.  Stable weight.  No significant recent changes in her medication other than stopping aspirin and starting Rhopressa.  No recent falls  Past Medical History:  Diagnosis Date   Cataracts, bilateral    Glaucoma    Hypertension    Past Surgical History:  Procedure Laterality Date   CATARACT EXTRACTION     Current Outpatient Medications  Medication Instructions   acetaminophen (TYLENOL) 650 mg, Oral, Every 8 hours PRN   ammonium lactate (AMLACTIN) 12 % cream Topical, As needed   brimonidine (ALPHAGAN) 0.15 % ophthalmic solution 1 drop, Both Eyes, 2 times daily   COD LIVER OIL PO Oral, As needed   felodipine (PLENDIL) 5 MG 24 hr tablet TAKE 1 TABLET(5 MG) BY MOUTH DAILY   fluticasone (FLONASE) 50 MCG/ACT nasal spray 1 spray, Daily   hydrochlorothiazide (MICROZIDE) 12.5 MG capsule TAKE 1 CAPSULE BY MOUTH EVERY MORNING FOR HIGH BLOOD PRESSURE   Multiple Vitamins-Minerals (ONE-A-DAY WOMENS PO) 1 tablet, Oral, Daily   RHOPRESSA 0.02 % SOLN 1 drop, Right Eye, Daily at bedtime   Travoprost, BAK Free, (TRAVATAN) 0.004 % SOLN ophthalmic solution INSTILL 1 DROP BOTH EYES AT NIGHTTIME   Family History  Problem Relation Age of Onset   Hypertension Mother    Heart disease Neg Hx     Social History:  reports that she has never smoked. She has never used smokeless tobacco. She reports that she does not drink alcohol and does not use drugs.   Exam: Current vital signs: BP (!) 153/70   Pulse Marland Kitchen)  50   Temp 98.1 F (36.7 C) (Oral)   Resp 13   Ht 5\' 1"  (1.549 m)   Wt 46 kg   SpO2 100%   BMI 19.16 kg/m  Vital signs in last 24 hours: Temp:  [98.1 F (36.7 C)] 98.1 F (36.7 C) (11/20 2000) Pulse Rate:  [40-52] 50 (11/21 0830) Resp:  [13-21] 13 (11/21 0830) BP: (91-183)/(61-115) 153/70 (11/21 0830) SpO2:  [94 %-100 %] 100 % (11/21 0830) Weight:  [46 kg] 46 kg (11/20 2001)   Physical Exam  Constitutional: Appears well-developed and thin (family reports weight has been stable  and appetite has been good).  Psych: Affect appropriate to situation, calm and cooperative Eyes: No scleral injection HENT: No oropharyngeal obstruction.  MSK: no joint deformities.  Cardiovascular: Bradycardic in the 50s, heart rate is occasionally somewhat irregular Respiratory: Effort normal, non-labored breathing GI: Soft.  No distension. There is no tenderness.  Skin: Warm dry and intact visible skin  Neuro: Mental Status: Patient is awake, alert, oriented to person, place, month, but not year (reports it is 1966), and situation. Patient is able to give some history but defers to her sister Mild word finding difficulties at times (calls pen a pencil initially for example), no evidence of neglect.  She does additionally require multiple repetitions to understand and follow some commands, which may be partly secondary to hearing impairment.  Family reports speech is at baseline Cranial Nerves: II: Visual Fields are full. Pupils are equal, round, and reactive to light.   III,IV, VI: EOM without ptosis or diploplia.  Saccadic pursuits and limited upgaze V: Facial sensation is symmetric to light touch VII: Facial movement is notable for mild flattening of the right nasolabial fold with near symmetric activation VIII: hearing is somewhat hard of hearing at baseline X: Uvula elevates symmetrically XI: Shoulder shrug is symmetric. XII: tongue is midline without atrophy or fasciculations.  Motor: Tone is notable for mild paratonia. Bulk is normal. 5/5 strength was present in all four extremities other than mild deltoid weakness (4+/5) and hip flexor weakness bilaterally 4/5 Sensory: Sensation is symmetric to light touch in the arms and legs without extinction to double simultaneous stimuli Deep Tendon Reflexes: 3+ and symmetric in the brachioradialis and 2+ and symmetric patellae.  Cerebellar: FNF and HKS are intact bilaterally  NIHSS total 0 Score breakdown: No new findings, some mild  aphasia and mild right nasolabial fold flattening which is her baseline per family    I have reviewed labs in epic and the results pertinent to this consultation are:  Basic Metabolic Panel: Recent Labs  Lab 01/06/21 2029  NA 138  K 4.1  CL 106  CO2 28  GLUCOSE 112*  BUN 36*  CREATININE 0.79  CALCIUM 8.9    CBC: Recent Labs  Lab 01/06/21 2029  WBC 5.1  NEUTROABS 3.2  HGB 12.7  HCT 41.2  MCV 80.6  PLT 185    Coagulation Studies: No results for input(s): LABPROT, INR in the last 72 hours.   No results found for: HGBA1C  Lab Results  Component Value Date   CHOL 156 01/07/2021   HDL 67 01/07/2021   LDLCALC 83 01/07/2021   TRIG 30 01/07/2021   CHOLHDL 2.3 01/07/2021     I have reviewed the images obtained:  Head CT personally reviewed, agree with radiology: 1. No evidence of cortical based infarct or hemorrhage , no hyperdense central vasculature. 2. Trace air in the right cavernous sinus and additional intravenous air  in the left temporal scalp. This likely is iatrogenic. No evidence of skull base fracture is seen. No air is seen in the cortical veins of either hemisphere. 3. Chronic change.  MRI brain personally reviewed, agree with radiology: 1. No acute intracranial abnormality. 2. Findings of chronic ischemic microangiopathy and generalized volume loss. [within limits for age]  Carotid ultrasound:  Minor carotid atherosclerosis. Negative for stenosis. Degree of narrowing less than 50% bilaterally by ultrasound criteria. Patent antegrade vertebral flow bilaterally  Long-term cardiac monitoring 01/25/2020 The patient was monitored for 13 days, 18 hours. The predominant rhythm was sinus with an average rate of 60 bpm (range 21-120 bpm). Rare PACs and PVCs were noted. Multiple episodes of second-degree AV block occurred, most of which are consistent with Mobitz type I. Some episodes of 2:1 AV block are also evident as well as consecutive nonconducted P  waves. The longest R-R intervalwas 4.1 seconds. There were no patient triggered events or tachyarrhythmias.   Impression: Most likely symptomatic bradycardia/hypotension, though TIA remains on the differential trial especially in the setting of potential cardiac arrhythmia.  Appreciate cardiology/primary team review of telemetry while here  Recommendations: -LDL near goal (less than 70) at 83, do not feel risks of statin initiation in this patient are outweighed by benefit -A1c meeting goal at 5.6% (goal less than 7%) -Echocardiogram read pending -Appreciate review of telemetry to confirm no atrial fibrillation -Given focal neurological deficits with this event, I do recommend 81 mg of aspirin daily -Appreciate management of symptomatic bradycardia/hypotension per primary team/cardiology, no further inpatient neurological work-up indicated at this time.  Please do reach out if any questions or concerns arise  Brooke Dare MD-PhD [facility] [phone] [facility] coverage for [facility] is from 8 AM to 4 AM in-house and 4 PM to 8 PM by telephone/video. 8 PM to 8 AM emergent questions or overnight urgent questions should be addressed to Teleneurology On-call or [facility] neurohospitalist; contact information can be found on AMION

## 2021-01-07 NOTE — ED Notes (Signed)
Pt assisted to the bathroom. NAD noted 

## 2021-01-07 NOTE — H&P (Signed)
[facility]   PATIENT NAME: Cassandra Patterson    MR#:  [id number]  DATE OF BIRTH:  09/13/1928  DATE OF ADMISSION:  01/06/2021  PRIMARY CARE PHYSICIAN: [facility]   Patient is coming from: Home  REQUESTING/REFERRING PHYSICIAN: Blake Divine, MD CHIEF COMPLAINT:   Chief Complaint  Patient presents with   Hypotension   Aphasia    HISTORY OF PRESENT ILLNESS:  Cassandra Patterson is a 87 y.o. slime African-American female with medical history significant for essential hypertension and glaucoma, who presented to the ER with acute onset of right facial droop with drooling associated expressive dysphasia and dysarthria, noted by her sister earlier this evening.  She denies any paresthesias or focal muscle weakness or abnormal gait.  No presyncope or syncope.  No headache or dizziness or blurred vision.  No tinnitus or vertigo.  She denies any chest pain or palpitations.  No nausea or vomiting or abdominal pain.  No urinary or stool incontinence.  No witnessed seizures.  ED Course: Upon presenting to the emergency room heart rate was 52 and has been as low as 40 with a blood pressure of 91/61 that was later up to 118/62 with otherwise normal vital signs.  Labs revealed a BUN of 36 with albumin of 3.2 and total bili 6.1 with otherwise unremarkable CMP.  CBC was within normal.  UA showed 6-10 WBCs with rare bacteria and trace leukocytes. EKG as reviewed by me : EKG showed sinus bradycardia with a rate 48 with poor R wave progression left anterior fascicular block with prolonged PR interval Imaging: Noncontrasted CT scan revealed the following: 1. No evidence of cortical based infarct or hemorrhage , no hyperdense central vasculature. 2. Trace air in the right cavernous sinus and additional intravenous air in the left temporal scalp. This likely is iatrogenic. No evidence of skull base fracture is seen. No air is seen in the cortical veins of either hemisphere. 3.  Chronic change. Chest x-ray showed aortic atherosclerosis and cardiomegaly with no acute cardiopulmonary disease.  The patient was given 500 mill IV lactated ringer bolus.  She will be admitted to an observation medical telemetry bed for further evaluation and management. PAST MEDICAL HISTORY:   Past Medical History:  Diagnosis Date   Cataracts, bilateral    Glaucoma    Hypertension     PAST SURGICAL HISTORY:   Past Surgical History:  Procedure Laterality Date   CATARACT EXTRACTION      SOCIAL HISTORY:   Social History   Tobacco Use   Smoking status: Never   Smokeless tobacco: Never  Substance Use Topics   Alcohol use: No    FAMILY HISTORY:   Family History  Problem Relation Age of Onset   Hypertension Mother    Heart disease Neg Hx     DRUG ALLERGIES:  No Known Allergies  REVIEW OF SYSTEMS:   ROS As per history of present illness. All pertinent systems were reviewed above. Constitutional, HEENT, cardiovascular, respiratory, GI, GU, musculoskeletal, neuro, psychiatric, endocrine, integumentary and hematologic systems were reviewed and are otherwise negative/unremarkable except for positive findings mentioned above in the HPI.   MEDICATIONS AT HOME:   Prior to Admission medications   Medication Sig Start Date End Date Taking? Authorizing Provider  acetaminophen (TYLENOL) 650 MG CR tablet Take 650 mg by mouth every 8 (eight) hours as needed for pain.    [provider]  ammonium lactate (AMLACTIN) 12 % cream Apply topically as needed. 07/24/19  [provider]  brimonidine (ALPHAGAN) 0.15 % ophthalmic solution Place 1 drop into both eyes 2 (two) times daily. 12/12/19   [provider]  COD LIVER OIL PO Take by mouth as needed.    [provider]  felodipine (PLENDIL) 5 MG 24 hr tablet TAKE 1 TABLET(5 MG) BY MOUTH DAILY 08/13/20   End, Harrell Gave, MD  hydrochlorothiazide (MICROZIDE) 12.5 MG capsule TAKE 1 CAPSULE BY MOUTH EVERY  MORNING FOR HIGH BLOOD PRESSURE 10/21/19   [provider]  Multiple Vitamins-Minerals (ONE-A-DAY WOMENS PO) Take 1 tablet by mouth daily.    [provider]  Travoprost, BAK Free, (TRAVATAN) 0.004 % SOLN ophthalmic solution INSTILL 1 DROP BOTH EYES AT NIGHTTIME 12/10/19   [provider]      VITAL SIGNS:  Blood pressure (!) 162/79, pulse (!) 48, temperature 98.1 F (36.7 C), temperature source Oral, resp. rate (!) 21, weight 46 kg, SpO2 100 %.  PHYSICAL EXAMINATION:  Physical Exam  GENERAL:  87 y.o.-year-old slim African-American female patient lying in the bed with no acute distress.  EYES: Pupils equal, round, reactive to light and accommodation. No scleral icterus. Extraocular muscles intact.  HEENT: Head atraumatic, normocephalic. Oropharynx and nasopharynx clear.  NECK:  Supple, no jugular venous distention. No thyroid enlargement, no tenderness.  LUNGS: Normal breath sounds bilaterally, no wheezing, rales,rhonchi or crepitation. No use of accessory muscles of respiration.  CARDIOVASCULAR: Regular rate and rhythm, S1, S2 normal.  2- 3/6 systolic ejection murmur at the left lower sternal border with no rubs, or gallops.  ABDOMEN: Soft, nondistended, nontender. Bowel sounds present. No organomegaly or mass.  EXTREMITIES: No pedal edema, cyanosis, or clubbing.  NEUROLOGIC: Cranial nerves II through XII are intact. Muscle strength 5/5 in all extremities. Sensation intact. Gait not checked.  PSYCHIATRIC: The patient is alert and oriented x 3.  Normal affect and good eye contact. SKIN: No obvious rash, lesion, or ulcer.   LABORATORY PANEL:   CBC Recent Labs  Lab 01/06/21 2029  WBC 5.1  HGB 12.7  HCT 41.2  PLT 185   ------------------------------------------------------------------------------------------------------------------  Chemistries  Recent Labs  Lab 01/06/21 2029  NA 138  K 4.1  CL 106  CO2 28  GLUCOSE 112*  BUN 36*  CREATININE 0.79   CALCIUM 8.9  AST 19  ALT 17  ALKPHOS 48  BILITOT 0.5   ------------------------------------------------------------------------------------------------------------------  Cardiac Enzymes No results for input(s): TROPONINI in the last 168 hours. ------------------------------------------------------------------------------------------------------------------  RADIOLOGY:  DG Chest 2 View  Result Date: 01/06/2021 CLINICAL DATA:  Weakness and slurred speech. EXAM: CHEST - 2 VIEW COMPARISON:  None. FINDINGS: The heart is moderately enlarged. The central vessels are normal caliber. There is no evidence of edema or focal pulmonary infiltrate, no pleural effusion is seen. The aorta is tortuous, ectatic, and with patchy calcifications. There is osteopenia and mild thoracic kyphosis. IMPRESSION: No evidence of acute chest disease. Cardiomegaly. Aortic atherosclerosis. Electronically Signed   By: Telford Nab M.D.   On: 01/06/2021 20:46   CT Head Wo Contrast  Result Date: 01/06/2021 CLINICAL DATA:  Neuro deficit with stroke suspected. Patient indicates transient slurred speech which resolved on arrival of EMS. EXAM: CT HEAD WITHOUT CONTRAST TECHNIQUE: Contiguous axial images were obtained from the base of the skull through the vertex without intravenous contrast. COMPARISON:  CT 04/06/2017 FINDINGS: Brain: There is moderately advanced cerebral atrophy with atrophic ventriculomegaly and mild small vessel disease of the cerebral white matter, with mild cerebellar atrophy. Findings were noted previously. There  is no midline shift no focal asymmetry worrisome for acute infarct, hemorrhage or mass. There are dural calcifications scattered along the falx and tiny chronic bilateral gangliocapsular lacunar infarctions also seen previously. Vascular: There patchy calcifications of the carotid siphons but no hyperdense central vasculature. There is trace air in the right cavernous sinus, small branching  lucencies in the left temporal scalp consistent with additional intravenous air, likely iatrogenic. Skull: Osteopenia. No depressed skull fracture or focal skull lesion. Sinuses/Orbits: No acute finding.  Old lens extractions. Other: None. IMPRESSION: 1. No evidence of cortical based infarct or hemorrhage , no hyperdense central vasculature. 2. Trace air in the right cavernous sinus and additional intravenous air in the left temporal scalp. This likely is iatrogenic. No evidence of skull base fracture is seen. No air is seen in the cortical veins of either hemisphere. 3. Chronic change. Electronically Signed   By: Almira Bar M.D.   On: 01/06/2021 20:55      IMPRESSION AND PLAN:  Principal Problem:   TIA (transient ischemic attack)  1.  TIA with right facial droop, drooling, expressive dysphagia as well as dysarthria. - The patient will be admitted to an observation medical telemetry bed. - We will follow neurochecks every 4 hours for 24 hours. - She will be placed on enteric-coated baby aspirin. - We will check fasting lipids and place her on small dose of statin. - PT/OT and ST consults will be obtained. - We will obtain a brain MRI without contrast as well as bilateral carotid Doppler for further assessment. - Neurology consult to be obtained. - Dr. Iver Nestle will be notified about the patient in AM.  I sent her a timed message.  2.  Essential hypertension. - We will hold off her Plendil and continue HCTZ with permissive hypertension.  3.  Mild pyuria and bacteriuria, rule out UTI. - We will obtain urine culture and sensitivity.  4.  Chronic sinus bradycardia with first-degree AV block and history of second-degree AV block. - Beta-blocker therapy with atenolol was discontinued in January of this year. - We will consider cardiology evaluation if she has symptomatic bradycardia. - We will optimize her electrolytes.  5.  Glaucoma. - We will continue her ophthalmic gtt.  DVT  prophylaxis: Lovenox. Code Status: full code. Family Communication:  The plan of care was discussed in details with the patient (and her sister who was with her in the room). I answered all questions. The patient agreed to proceed with the above mentioned plan. Further management will depend upon hospital course. Disposition Plan: Back to previous home environment Consults called: Neurology.  All the records are reviewed and case discussed with ED provider.  Status is: Observation   Remains inpatient appropriate because:Ongoing diagnostic testing needed not appropriate for outpatient work up, Unsafe d/c plan, IV treatments appropriate due to intensity of illness or inability to take PO, and Inpatient level of care appropriate due to severity of illness   Dispo: The patient is from: Home              Anticipated d/c is to: Home              Patient currently is not medically stable to d/c.              Difficult to place patient: No  TOTAL TIME TAKING CARE OF THIS PATIENT: 55 minutes.     Hannah Beat M.D on 01/07/2021 at 12:09 AM  [facility]   From 7 PM-7  AM, contact night-coverage [email or web address]  CC: Primary care physician; [facility]

## 2021-01-07 NOTE — ED Notes (Signed)
MD Vernon Prey at this time regarding this pt's HR in the 30's-40's and BP of 97/57.

## 2021-01-07 NOTE — Consult Note (Signed)
Cardiology Consultation:   Patient ID: [name] MRN: [id number]; DOB: [birth year]  Admit date: 01/06/2021 Date of Consult: 01/07/2021  PCP:  [facility]   [facility] Providers Cardiologist:  Yvonne Kendall, MD   { :1  Patient Profile:   Cassandra Patterson is a 87 y.o. female with a hx of first and second degree AV block during work-up for palpitations, HTN, glaucoma who is being seen 01/07/2021 for the evaluation of bradcyardia at the request of dr. Allena Katz.  History of Present Illness:   Cassandra Patterson is followed by Dr. Okey Dupre. Previously noted to have heart block during work-up for palpitations. Even monitor showed pauses up to 4.1 seconds, multiple episodes of second degree AV block, are PACs/PVCs. Timolol was discontinued. Denied ligtheadedness or dizziness. Last seen 06/06/20 and reported fatigue, suspect secondary to heart block. Pacemaker was discussed at the time, but it was declined by the patient and the daughter.   Echo 02/2020 showed LVEF 55-60%, normal LV function, G1DD, low normal RV function, mildly dilated LA, mild AI.   The patient presented to the ER 01/07/21 for hypotension and aphasia. Reported slurred speech 1 hour prior to arrival. Also reported lightheadedness and possible passed out. EMS was call who noted improved speech. Noted she was bradycardic into the 40s. She was given small IVF bolus and brought to the ER.   In th ED BP 91/61, pulse 52bpm, 100% RA, RR 18. Labs showed potassium 4.1, CO2 28, glucose 112, Scr 0.79, BUN 36, albumin 3.2, WBC 5.1, Hgb 12.7. Respiratory panel negative. CXR unremarkable. CT head without acute process. MRI brain with no acute abnormality.Pt was given IVF bolus 500 cc and admitted for further-workup.  Past Medical History:  Diagnosis Date   Cataracts, bilateral    Glaucoma    Hypertension     Past Surgical History:  Procedure Laterality Date   CATARACT EXTRACTION       Home Medications:  Prior  to Admission medications   Medication Sig Start Date End Date Taking? Authorizing Provider  acetaminophen (TYLENOL) 650 MG CR tablet Take 650 mg by mouth every 8 (eight) hours as needed for pain.   Yes [provider]  ammonium lactate (AMLACTIN) 12 % cream Apply topically as needed. 07/24/19  Yes [provider]  brimonidine (ALPHAGAN) 0.15 % ophthalmic solution Place 1 drop into both eyes 2 (two) times daily. 12/12/19  Yes [provider]  COD LIVER OIL PO Take by mouth as needed.   Yes [provider]  felodipine (PLENDIL) 5 MG 24 hr tablet TAKE 1 TABLET(5 MG) BY MOUTH DAILY 08/13/20  Yes End, Cristal Deer, MD  hydrochlorothiazide (MICROZIDE) 12.5 MG capsule TAKE 1 CAPSULE BY MOUTH EVERY MORNING FOR HIGH BLOOD PRESSURE 10/21/19  Yes [provider]  Multiple Vitamins-Minerals (ONE-A-DAY WOMENS PO) Take 1 tablet by mouth daily.   Yes [provider]  RHOPRESSA 0.02 % SOLN Place 1 drop into the right eye at bedtime. 12/12/20  Yes [provider]  Travoprost, BAK Free, (TRAVATAN) 0.004 % SOLN ophthalmic solution INSTILL 1 DROP BOTH EYES AT NIGHTTIME 12/10/19  Yes [provider]  fluticasone (FLONASE) 50 MCG/ACT nasal spray Place 1 spray into both nostrils daily. Patient not taking: Reported on 01/07/2021 08/09/20   [provider]    Inpatient Medications: Scheduled Meds:   stroke: mapping our early stages of recovery book   Does not apply Once   aspirin EC  81 mg Oral Daily   atorvastatin  10 mg Oral Daily   brimonidine  1 drop Both Eyes BID   enoxaparin (LOVENOX) injection  40 mg Subcutaneous Q24H   felodipine  5 mg Oral Daily   hydrochlorothiazide  12.5 mg Oral Daily   latanoprost  1 drop Both Eyes QHS   multivitamin with minerals   Oral Daily   Continuous Infusions:  PRN Meds: acetaminophen **OR** acetaminophen (TYLENOL) oral liquid 160 mg/5 mL **OR** acetaminophen, ammonium lactate,  senna-docusate  Allergies:   No Known Allergies  Social History:   Social History   Socioeconomic History   Marital status: Married    Spouse name: Not on file   Number of children: Not on file   Years of education: Not on file   Highest education level: Not on file  Occupational History   Not on file  Tobacco Use   Smoking status: Never   Smokeless tobacco: Never  Vaping Use   Vaping Use: Never used  Substance and Sexual Activity   Alcohol use: No   Drug use: No   Sexual activity: Not on file  Other Topics Concern   Not on file  Social History Narrative   Not on file   Social Determinants of Health   Financial Resource Strain: Not on file  Food Insecurity: Not on file  Transportation Needs: Not on file  Physical Activity: Not on file  Stress: Not on file  Social Connections: Not on file  Intimate Partner Violence: Not on file    Family History:    Family History  Problem Relation Age of Onset   Hypertension Mother    Heart disease Neg Hx      ROS:  Please see the history of present illness.   All other ROS reviewed and negative.     Physical Exam/Data:   Vitals:   01/07/21 1314 01/07/21 1315 01/07/21 1316 01/07/21 1415  BP:    (!) 146/65  Pulse:    (!) 46  Resp:  10 13 18   Temp:      TempSrc:      SpO2: 100% 100%  100%  Weight:      Height:        Intake/Output Summary (Last 24 hours) at 01/07/2021 1418 Last data filed at 01/06/2021 2128 Gross per 24 hour  Intake 500 ml  Output --  Net 500 ml   Last 3 Weights 01/06/2021 06/06/2020 02/22/2020  Weight (lbs) 101 lb 6.4 oz 95 lb 97 lb 2 oz  Weight (kg) 45.995 kg 43.092 kg 44.056 kg     Body mass index is 19.16 kg/m.  General:  Well nourished, well developed, in no acute distress HEENT: normal Neck: no JVD Vascular: No carotid bruits; Distal pulses 2+ bilaterally Cardiac:  normal S1, S2; SB; no murmur  Lungs:  clear to auscultation bilaterally, no wheezing, rhonchi or rales  Abd: soft,  nontender, no hepatomegaly  Ext: no edema Musculoskeletal:  No deformities, BUE and BLE strength normal and equal Skin: warm and dry  Neuro:  CNs 2-12 intact, no focal abnormalities noted Psych:  Normal affect   EKG:  The EKG was personally reviewed and demonstrates:  SB, 28bpm, 1st degree AV block, PRI 04/21/2020, LAFB, LAD Telemetry:  Telemetry was personally reviewed and demonstrates:  SB, first degree AV block, pause 2.04 seconds  Relevant CV Studies:  Echo bubble study 01/07/21  1. Left ventricular ejection fraction, by estimation, is 60 to 65%. The  left ventricle has normal function. The left ventricle has no  regional  wall motion abnormalities.   2. Right ventricular systolic function is normal. The right ventricular  size is normal. There is mildly elevated pulmonary artery systolic  pressure.   3. Left atrial size was mildly dilated.   4. Right atrial size was mildly dilated.   5. A small pericardial effusion is present. The pericardial effusion is  posterior to the left ventricle.   6. The mitral valve is normal in structure. Mild mitral valve  regurgitation. No evidence of mitral stenosis.   7. Tricuspid valve regurgitation is mild to moderate.   8. The aortic valve is normal in structure. Aortic valve regurgitation is  mild. Aortic valve sclerosis/calcification is present, without any  evidence of aortic stenosis.   9. The inferior vena cava is normal in size with greater than 50%  respiratory variability, suggesting right atrial pressure of 3 mmHg.  10. Agitated saline contrast bubble study was negative, with no evidence  of any interatrial shunt.   Echo 02/2020  1. Left ventricular ejection fraction, by estimation, is 55 to 60%. The  left ventricle has normal function. The left ventricle has no regional  wall motion abnormalities. Left ventricular diastolic parameters are  consistent with Grade I diastolic  dysfunction (impaired relaxation).   2. Right ventricular  systolic function is low normal. The right  ventricular size is mildly enlarged.   3. Left atrial size was mildly dilated.   4. Right atrial size was severely dilated.   5. The mitral valve is normal in structure. Mild mitral valve  regurgitation.   6. Tricuspid valve regurgitation is moderate.   7. The aortic valve is tricuspid. Aortic valve regurgitation is mild. No  aortic stenosis is present.   8. The inferior vena cava is normal in size with <50% respiratory  variability, suggesting right atrial pressure of 8 mmHg.   Heart monitor 01/2020 The patient was monitored for 13 days, 18 hours. The predominant rhythm was sinus with an average rate of 60 bpm (range 21-120 bpm). Rare PACs and PVCs were noted. Multiple episodes of second-degree AV block occurred, most of which are consistent with Mobitz type I. Some episodes of 2:1 AV block are also evident as well as consecutive nonconducted P waves. The longest R-R intervalwas 4.1 seconds. There were no patient triggered events or tachyarrhythmias.   Sinus rhythm with multiple episodes of second-degree AV block (predominantly Mobitz type I though a few episodes of Mobitz type II cannot be excluded).  Longest R-R interval was 4.1 seconds.  Rare PACs and PVCs noted.    Laboratory Data:  High Sensitivity Troponin:   Recent Labs  Lab 01/06/21 2029 01/07/21 0614  TROPONINIHS 7 8     Chemistry Recent Labs  Lab 01/06/21 2029  NA 138  K 4.1  CL 106  CO2 28  GLUCOSE 112*  BUN 36*  CREATININE 0.79  CALCIUM 8.9  GFRNONAA >60  ANIONGAP 4*    Recent Labs  Lab 01/06/21 2029  PROT 6.1*  ALBUMIN 3.2*  AST 19  ALT 17  ALKPHOS 48  BILITOT 0.5   Lipids  Recent Labs  Lab 01/07/21 0614  CHOL 156  TRIG 30  HDL 67  LDLCALC 83  CHOLHDL 2.3    Hematology Recent Labs  Lab 01/06/21 2029  WBC 5.1  RBC 5.11  HGB 12.7  HCT 41.2  MCV 80.6  MCH 24.9*  MCHC 30.8  RDW 13.7  PLT 185   Thyroid No results for input(s): TSH,  FREET4  in the last 168 hours.  BNPNo results for input(s): BNP, PROBNP in the last 168 hours.  DDimer No results for input(s): DDIMER in the last 168 hours.   Radiology/Studies:  DG Chest 2 View  Result Date: 01/06/2021 CLINICAL DATA:  Weakness and slurred speech. EXAM: CHEST - 2 VIEW COMPARISON:  None. FINDINGS: The heart is moderately enlarged. The central vessels are normal caliber. There is no evidence of edema or focal pulmonary infiltrate, no pleural effusion is seen. The aorta is tortuous, ectatic, and with patchy calcifications. There is osteopenia and mild thoracic kyphosis. IMPRESSION: No evidence of acute chest disease. Cardiomegaly. Aortic atherosclerosis. Electronically Signed   By: Almira Bar M.D.   On: 01/06/2021 20:46   CT Head Wo Contrast  Result Date: 01/06/2021 CLINICAL DATA:  Neuro deficit with stroke suspected. Patient indicates transient slurred speech which resolved on arrival of EMS. EXAM: CT HEAD WITHOUT CONTRAST TECHNIQUE: Contiguous axial images were obtained from the base of the skull through the vertex without intravenous contrast. COMPARISON:  CT 04/06/2017 FINDINGS: Brain: There is moderately advanced cerebral atrophy with atrophic ventriculomegaly and mild small vessel disease of the cerebral white matter, with mild cerebellar atrophy. Findings were noted previously. There is no midline shift no focal asymmetry worrisome for acute infarct, hemorrhage or mass. There are dural calcifications scattered along the falx and tiny chronic bilateral gangliocapsular lacunar infarctions also seen previously. Vascular: There patchy calcifications of the carotid siphons but no hyperdense central vasculature. There is trace air in the right cavernous sinus, small branching lucencies in the left temporal scalp consistent with additional intravenous air, likely iatrogenic. Skull: Osteopenia. No depressed skull fracture or focal skull lesion. Sinuses/Orbits: No acute finding.  Old  lens extractions. Other: None. IMPRESSION: 1. No evidence of cortical based infarct or hemorrhage , no hyperdense central vasculature. 2. Trace air in the right cavernous sinus and additional intravenous air in the left temporal scalp. This likely is iatrogenic. No evidence of skull base fracture is seen. No air is seen in the cortical veins of either hemisphere. 3. Chronic change. Electronically Signed   By: Almira Bar M.D.   On: 01/06/2021 20:55   MR BRAIN WO CONTRAST  Result Date: 01/07/2021 CLINICAL DATA:  Acute neurologic deficit EXAM: MRI HEAD WITHOUT CONTRAST TECHNIQUE: Multiplanar, multiecho pulse sequences of the brain and surrounding structures were obtained without intravenous contrast. COMPARISON:  None. FINDINGS: Brain: No acute infarct, mass effect or extra-axial collection. No acute or chronic hemorrhage. There is multifocal hyperintense T2-weighted signal within the white matter. Generalized volume loss without a clear lobar predilection. Old right cerebellar small vessel infarct. The midline structures are normal. Vascular: Major flow voids are preserved. Skull and upper cervical spine: Normal calvarium and skull base. Visualized upper cervical spine and soft tissues are normal. Sinuses/Orbits:No paranasal sinus fluid levels or advanced mucosal thickening. No mastoid or middle ear effusion. Normal orbits. IMPRESSION: 1. No acute intracranial abnormality. 2. Findings of chronic ischemic microangiopathy and generalized volume loss. Electronically Signed   By: Deatra Robinson M.D.   On: 01/07/2021 01:59   US Carotid Bilateral (at [facility] and [facility] only)  Result Date: 01/07/2021 CLINICAL DATA:  TIA symptoms, hypertension, stroke EXAM: BILATERAL CAROTID DUPLEX ULTRASOUND TECHNIQUE: Wallace Cullens scale imaging, color Doppler and duplex ultrasound were performed of bilateral carotid and vertebral arteries in the neck. COMPARISON:  None. FINDINGS: Criteria: Quantification of carotid stenosis is based on  velocity parameters that correlate the residual internal carotid diameter with NASCET-based stenosis levels, using  the diameter of the distal internal carotid lumen as the denominator for stenosis measurement. The following velocity measurements were obtained: RIGHT ICA: 67/17 cm/sec CCA: 34 cm/sec SYSTOLIC ICA/CCA RATIO:  2.0 ECA: 43 cm/sec LEFT ICA: 63/16 cm/sec CCA: 39 cm/sec SYSTOLIC ICA/CCA RATIO:  1.6 ECA: 49 cm/sec RIGHT CAROTID ARTERY: Minor echogenic shadowing plaque formation. No hemodynamically significant right ICA stenosis, velocity elevation, or turbulent flow. Degree of narrowing less than 50%. RIGHT VERTEBRAL ARTERY:  Normal antegrade flow LEFT CAROTID ARTERY: Similar scattered minor echogenic plaque formation. No hemodynamically significant left ICA stenosis, velocity elevation, or turbulent flow. LEFT VERTEBRAL ARTERY:  Normal antegrade flow IMPRESSION: Minor carotid atherosclerosis. Negative for stenosis. Degree of narrowing less than 50% bilaterally by ultrasound criteria. Patent antegrade vertebral flow bilaterally Electronically Signed   By: Judie Petit.  Shick M.D.   On: 01/07/2021 08:35     Assessment and Plan:   Slurred speech/LOC - CTA head and MRI with no evidence of acute stroke, do not suspect TIA - unsure if symptoms due to hypotension/bradycardia - s/p IVF - neurology consulted - US carotid dopplers showed minor carotid atherosclerosis, negative for stenosis less than 50% - echo bubble study showed LVEF 60-65%, no WMA, normal RV function, mildly elevated pulmonary artery pressure, mildly dilated L&R atrium, mild MR, mild to mod TR, negative bubble study - EKG showed SB 48bpm, PRI , LAD - telemetry shows SB with first degree av block heart rate in the 40s, pause 2.04 seconds  Bradycardia H/o first and second degree AV block - heart monitor in 01/2020 showed predominately NSR, rare RACs, PVCs, multiple episodes of second-degree AV block, most Mobitz type 1, some 2:1 Av block,  longest pause 4.1 seconds.  - BB discontinued earlier in the year - Keep K>4 and Mag>2 - will check TSH - echo above - discussed pacemaker at the last office visit, but patient declined, may need to revisit this. Recommend EP consult  HTN - Plendil held - PTA HCTZ continued  For questions or updates, please contact [facility] Please consult [email or web address] for contact info under    Signed, Aswad Wandrey David Stall, PA-C  01/07/2021 2:18 PM

## 2021-01-07 NOTE — Progress Notes (Signed)
[facility]  - [place] [facility]   PATIENT NAME: Cassandra Patterson    MR#:  [id number]  DATE OF BIRTH:  1928/07/11  SUBJECTIVE:   came in after she had an episode of aphasia and passing out at home. Patient is alert oriented. Sister at bedside. Denies any dizziness. Has history of chronic low heart rate. No falls. Any chest pain REVIEW OF SYSTEMS:   Review of Systems  Constitutional:  Negative for chills, fever and weight loss.  HENT:  Negative for ear discharge, ear pain and nosebleeds.   Eyes:  Negative for blurred vision, pain and discharge.  Respiratory:  Negative for sputum production, shortness of breath, wheezing and stridor.   Cardiovascular:  Negative for chest pain, palpitations, orthopnea and PND.  Gastrointestinal:  Negative for abdominal pain, diarrhea, nausea and vomiting.  Genitourinary:  Negative for frequency and urgency.  Musculoskeletal:  Negative for back pain and joint pain.  Neurological:  Positive for weakness. Negative for sensory change, speech change and focal weakness.  Psychiatric/Behavioral:  Negative for depression and hallucinations. The patient is not nervous/anxious.   Tolerating Diet: yes Tolerating PT:   DRUG ALLERGIES:  No Known Allergies  VITALS:  Blood pressure (!) 146/65, pulse (!) 46, temperature 98.1 F (36.7 C), temperature source Oral, resp. rate 18, height 5\' 1"  (1.549 m), weight 46 kg, SpO2 100 %.  PHYSICAL EXAMINATION:   Physical Exam  GENERAL:  87 y.o.-year-old patient lying in the bed with no acute distress.  HEENT: Head atraumatic, normocephalic. Oropharynx and nasopharynx clear.  LUNGS: Normal breath sounds bilaterally, no wheezing, rales, rhonchi. No use of accessory muscles of respiration.  CARDIOVASCULAR: S1, S2 normal. No murmurs, rubs, or gallops.  ABDOMEN: Soft, nontender, nondistended. Bowel sounds present. No organomegaly or mass.  EXTREMITIES: No cyanosis, clubbing or edema b/l.    NEUROLOGIC:  nonfocal PSYCHIATRIC:  patient is alert and oriented x 2.  SKIN: No obvious rash, lesion, or ulcer.   LABORATORY PANEL:  CBC Recent Labs  Lab 01/06/21 2029  WBC 5.1  HGB 12.7  HCT 41.2  PLT 185    Chemistries  Recent Labs  Lab 01/06/21 2029  NA 138  K 4.1  CL 106  CO2 28  GLUCOSE 112*  BUN 36*  CREATININE 0.79  CALCIUM 8.9  AST 19  ALT 17  ALKPHOS 48  BILITOT 0.5   Cardiac Enzymes No results for input(s): TROPONINI in the last 168 hours. RADIOLOGY:  DG Chest 2 View  Result Date: 01/06/2021 CLINICAL DATA:  Weakness and slurred speech. EXAM: CHEST - 2 VIEW COMPARISON:  None. FINDINGS: The heart is moderately enlarged. The central vessels are normal caliber. There is no evidence of edema or focal pulmonary infiltrate, no pleural effusion is seen. The aorta is tortuous, ectatic, and with patchy calcifications. There is osteopenia and mild thoracic kyphosis. IMPRESSION: No evidence of acute chest disease. Cardiomegaly. Aortic atherosclerosis. Electronically Signed   By: 01/08/2021 M.D.   On: 01/06/2021 20:46   CT Head Wo Contrast  Result Date: 01/06/2021 CLINICAL DATA:  Neuro deficit with stroke suspected. Patient indicates transient slurred speech which resolved on arrival of EMS. EXAM: CT HEAD WITHOUT CONTRAST TECHNIQUE: Contiguous axial images were obtained from the base of the skull through the vertex without intravenous contrast. COMPARISON:  CT 04/06/2017 FINDINGS: Brain: There is moderately advanced cerebral atrophy with atrophic ventriculomegaly and mild small vessel disease of the cerebral white matter, with mild cerebellar atrophy. Findings were noted previously. There is  no midline shift no focal asymmetry worrisome for acute infarct, hemorrhage or mass. There are dural calcifications scattered along the falx and tiny chronic bilateral gangliocapsular lacunar infarctions also seen previously. Vascular: There patchy calcifications of the carotid siphons but no  hyperdense central vasculature. There is trace air in the right cavernous sinus, small branching lucencies in the left temporal scalp consistent with additional intravenous air, likely iatrogenic. Skull: Osteopenia. No depressed skull fracture or focal skull lesion. Sinuses/Orbits: No acute finding.  Old lens extractions. Other: None. IMPRESSION: 1. No evidence of cortical based infarct or hemorrhage , no hyperdense central vasculature. 2. Trace air in the right cavernous sinus and additional intravenous air in the left temporal scalp. This likely is iatrogenic. No evidence of skull base fracture is seen. No air is seen in the cortical veins of either hemisphere. 3. Chronic change. Electronically Signed   By: Almira Bar M.D.   On: 01/06/2021 20:55   MR BRAIN WO CONTRAST  Result Date: 01/07/2021 CLINICAL DATA:  Acute neurologic deficit EXAM: MRI HEAD WITHOUT CONTRAST TECHNIQUE: Multiplanar, multiecho pulse sequences of the brain and surrounding structures were obtained without intravenous contrast. COMPARISON:  None. FINDINGS: Brain: No acute infarct, mass effect or extra-axial collection. No acute or chronic hemorrhage. There is multifocal hyperintense T2-weighted signal within the white matter. Generalized volume loss without a clear lobar predilection. Old right cerebellar small vessel infarct. The midline structures are normal. Vascular: Major flow voids are preserved. Skull and upper cervical spine: Normal calvarium and skull base. Visualized upper cervical spine and soft tissues are normal. Sinuses/Orbits:No paranasal sinus fluid levels or advanced mucosal thickening. No mastoid or middle ear effusion. Normal orbits. IMPRESSION: 1. No acute intracranial abnormality. 2. Findings of chronic ischemic microangiopathy and generalized volume loss. Electronically Signed   By: Deatra Robinson M.D.   On: 01/07/2021 01:59   US Carotid Bilateral (at [facility] and [facility] only)  Result Date: 01/07/2021 CLINICAL DATA:   TIA symptoms, hypertension, stroke EXAM: BILATERAL CAROTID DUPLEX ULTRASOUND TECHNIQUE: Wallace Cullens scale imaging, color Doppler and duplex ultrasound were performed of bilateral carotid and vertebral arteries in the neck. COMPARISON:  None. FINDINGS: Criteria: Quantification of carotid stenosis is based on velocity parameters that correlate the residual internal carotid diameter with NASCET-based stenosis levels, using the diameter of the distal internal carotid lumen as the denominator for stenosis measurement. The following velocity measurements were obtained: RIGHT ICA: 67/17 cm/sec CCA: 34 cm/sec SYSTOLIC ICA/CCA RATIO:  2.0 ECA: 43 cm/sec LEFT ICA: 63/16 cm/sec CCA: 39 cm/sec SYSTOLIC ICA/CCA RATIO:  1.6 ECA: 49 cm/sec RIGHT CAROTID ARTERY: Minor echogenic shadowing plaque formation. No hemodynamically significant right ICA stenosis, velocity elevation, or turbulent flow. Degree of narrowing less than 50%. RIGHT VERTEBRAL ARTERY:  Normal antegrade flow LEFT CAROTID ARTERY: Similar scattered minor echogenic plaque formation. No hemodynamically significant left ICA stenosis, velocity elevation, or turbulent flow. LEFT VERTEBRAL ARTERY:  Normal antegrade flow IMPRESSION: Minor carotid atherosclerosis. Negative for stenosis. Degree of narrowing less than 50% bilaterally by ultrasound criteria. Patent antegrade vertebral flow bilaterally Electronically Signed   By: Judie Petit.  Shick M.D.   On: 01/07/2021 08:35   ECHOCARDIOGRAM LIMITED BUBBLE STUDY  Result Date: 01/07/2021    ECHOCARDIOGRAM LIMITED REPORT   Patient Name:   PATRESE BRUTTO Date of Exam: 01/07/2021 Medical Rec #:  [id number]           Height:       61.0 in Accession #:    [id number]  Weight:       101.4 lb Date of Birth:  1928-09-04           BSA:          1.415 m Patient Age:    89 years            BP:           152/77 mmHg Patient Gender: F                   HR:           41 bpm. Exam Location:  [facility] Procedure: Limited Echo, Limited Color Doppler,  Cardiac Doppler and Saline            Contrast Bubble Study Indications:     G45.9 TIA  History:         Patient has prior history of Echocardiogram examinations, most                  recent 02/29/2020. Arrythmias:2nd degree heart block; Risk                  Factors:Hypertension.  Sonographer:     Humphrey Rolls Referring Phys:  [id number] JAN A MANSY Diagnosing Phys: Lorine Bears MD IMPRESSIONS  1. Left ventricular ejection fraction, by estimation, is 60 to 65%. The left ventricle has normal function. The left ventricle has no regional wall motion abnormalities.  2. Right ventricular systolic function is normal. The right ventricular size is normal. There is mildly elevated pulmonary artery systolic pressure.  3. Left atrial size was mildly dilated.  4. Right atrial size was mildly dilated.  5. A small pericardial effusion is present. The pericardial effusion is posterior to the left ventricle.  6. The mitral valve is normal in structure. Mild mitral valve regurgitation. No evidence of mitral stenosis.  7. Tricuspid valve regurgitation is mild to moderate.  8. The aortic valve is normal in structure. Aortic valve regurgitation is mild. Aortic valve sclerosis/calcification is present, without any evidence of aortic stenosis.  9. The inferior vena cava is normal in size with greater than 50% respiratory variability, suggesting right atrial pressure of 3 mmHg. 10. Agitated saline contrast bubble study was negative, with no evidence of any interatrial shunt. FINDINGS  Left Ventricle: Left ventricular ejection fraction, by estimation, is 60 to 65%. The left ventricle has normal function. The left ventricle has no regional wall motion abnormalities. The left ventricular internal cavity size was normal in size. There is  no left ventricular hypertrophy. Right Ventricle: The right ventricular size is normal. No increase in right ventricular wall thickness. Right ventricular systolic function is normal. There is mildly elevated  pulmonary artery systolic pressure. The tricuspid regurgitant velocity is 2.81  m/s, and with an assumed right atrial pressure of 5 mmHg, the estimated right ventricular systolic pressure is 36.6 mmHg. Left Atrium: Left atrial size was mildly dilated. Right Atrium: Right atrial size was mildly dilated. Pericardium: A small pericardial effusion is present. The pericardial effusion is posterior to the left ventricle. Mitral Valve: The mitral valve is normal in structure. There is mild thickening of the mitral valve leaflet(s). Mild mitral valve regurgitation. No evidence of mitral valve stenosis. Tricuspid Valve: The tricuspid valve is normal in structure. Tricuspid valve regurgitation is mild to moderate. No evidence of tricuspid stenosis. Aortic Valve: The aortic valve is normal in structure. Aortic valve regurgitation is mild. Aortic valve sclerosis/calcification is present, without any evidence of aortic stenosis. Pulmonic  Valve: The pulmonic valve was normal in structure. Pulmonic valve regurgitation is not visualized. No evidence of pulmonic stenosis. Aorta: The aortic root is normal in size and structure. Venous: The inferior vena cava is normal in size with greater than 50% respiratory variability, suggesting right atrial pressure of 3 mmHg. IAS/Shunts: No atrial level shunt detected by color flow Doppler. Agitated saline contrast was given intravenously to evaluate for intracardiac shunting. Agitated saline contrast bubble study was negative, with no evidence of any interatrial shunt. LEFT VENTRICLE PLAX 2D LVIDd:         3.43 cm LVIDs:         2.19 cm LV PW:         0.97 cm LV IVS:        0.77 cm  LEFT ATRIUM         Index LA diam:    2.20 cm 1.55 cm/m  TRICUSPID VALVE TR Peak grad:   31.6 mmHg TR Vmax:        281.00 cm/s Lorine Bears MD Electronically signed by Lorine Bears MD Signature Date/Time: 01/07/2021/2:31:52 PM    Final    ASSESSMENT AND PLAN:  SHAYLYN BAWA is a 87 y.o. slime  African-American female with medical history significant for essential hypertension and glaucoma, who presented to the ER with acute onset of right facial droop with drooling associated expressive dysphasia and dysarthria, noted by her sister earlier this evening  altered level of consciousness/slurred speech -- patient presented with working diagnosis of TIA -- she was seen and evaluated by neurology. Workup for TIA essentially unremarkable -- MRI brain negative for CVA -- ultrasound Doppler showed minor carotid atherosclerosis. Negative for stenosis -- echo bubble study showedLVEF 60-65%, no WMA, normal RV function, mildly elevated pulmonary artery pressure, mildly dilated L&R atrium, mild MR, mild to mod TR, negative bubble study - EKG showed SB 48bpm, PRI , LAD -- patient has first-degree AV block heart rate in the 40s with pauses intermittently -- seen by neurology. Recommends aspirin  history of bradycardia-- chronic history of first and second-degree AV block -- she was seen by Dr. Okey Dupre in 2021. Holter monitor was placed on her at home -- pacemaker was discussed. However patient declined at that time -- cardiology consultation with Dr. Kirke Corin. Await recommendation -- patient is not on any rate blocking agent -- check TSH  Hypertension -- hold hydrochlorothiazide -- on plendil  Glaucoma -- continue her home eyedrops   Procedures: Family communication : sister at bedside Consults : cardiology CODE STATUS: full DVT Prophylaxis : Lovenox Level of care: Telemetry Medical Status is: Observation  The patient remains OBS appropriate and will d/c before 2 midnights.       TOTAL TIME TAKING CARE OF THIS PATIENT: 30 minutes.  >50% time spent on counselling and coordination of care  Note: This dictation was prepared with Dragon dictation along with smaller phrase technology. Any transcriptional errors that result from this process are unintentional.  Enedina Finner M.D     [facility]   CC: Primary care physician; Center, [facility] Patient ID: KEREN ALVERIO, female   DOB: 10/27/1928, 87 y.o.   MRN: [id number]

## 2021-01-07 NOTE — Evaluation (Signed)
Occupational Therapy Evaluation Patient Details Name: Cassandra Patterson MRN: [id number] DOB: 10/03/1928 Today's Date: 01/07/2021   History of Present Illness Pt is a 87 y/o F who presented to the ED with c/o sudden onset slurred speech but upon EMS arrival pt's speech seemed to improve. MRI reveals no acute infarct. PMH: HTN, mobitz type 1 second degree heart block, B cataracts, glaucoma   Clinical Impression   Pt was seen for OT evaluation this date. Prior to hospital admission, pt was living alone in a 1 story house and has help from her sister throughout the day, who lives next door. At baseline, pt is able to independently dress and toilet, sister helps with seated sink baths, cleaning, medications, and meals. Pt/family deny falls in past 58mo. Currently pt demonstrates mild impairments as described below (See OT problem list) which minimally limit her ability to perform ADL/self-care tasks. Pt currently is near baseline for ADL tasks. Pt/family educated in use of pulse oximeter to monitor HR to maximize safety at home. Pt would benefit from skilled OT services while hospitalized to address noted impairments and functional limitations (see below for any additional details) in order to maximize safety and independence while minimizing falls risk and caregiver burden. Upon hospital discharge, do not anticipate skilled OT needs. Pt/family endorse ability to return home with current level of assist without difficulty.     Recommendations for follow up therapy are one component of a multi-disciplinary discharge planning process, led by the attending physician.  Recommendations may be updated based on patient status, additional functional criteria and insurance authorization.   Follow Up Recommendations  No OT follow up    Assistance Recommended at Discharge Frequent or constant Supervision/Assistance  Functional Status Assessment  Patient has had a recent decline in their functional status and  demonstrates the ability to make significant improvements in function in a reasonable and predictable amount of time.  Equipment Recommendations  None recommended by OT    Recommendations for Other Services       Precautions / Restrictions Precautions Precautions: Fall Restrictions Weight Bearing Restrictions: No      Mobility Bed Mobility Overal bed mobility: Needs Assistance Bed Mobility: Supine to Sit;Sit to Supine     Supine to sit: Supervision;HOB elevated Sit to supine: Supervision;HOB elevated              Balance                            ADL either performed or assessed with clinical judgement   ADL Overall ADL's : At baseline                                       General ADL Comments: Pt able to perform dressing tasks and toileting at/near baseline with sister/s able to assist as needed.     Vision         Perception     Praxis      Pertinent Vitals/Pain Pain Assessment: No/denies pain     Hand Dominance     Extremity/Trunk Assessment Upper Extremity Assessment Upper Extremity Assessment: Overall WFL for tasks assessed (age-appropriate strength)   Lower Extremity Assessment Lower Extremity Assessment: Generalized weakness       Communication Communication Communication: HOH   Cognition Arousal/Alertness: Awake/alert Behavior During Therapy: WFL for tasks assessed/performed Overall Cognitive Status: Within Functional Limits  for tasks assessed                                       General Comments      Exercises Other Exercises Other Exercises: Pt/sisters educated in benefits of a pulse oximeter to monitor heart rate at home   Shoulder Instructions      Home Living Family/patient expects to be discharged to:: Private residence Living Arrangements: Other relatives Available Help at Discharge: Family;Available 24 hours/day Type of Home: House Home Access: Stairs to enter [employer] of Steps: 1 Entrance Stairs-Rails: None Home Layout: One level     Bathroom Shower/Tub: Tub/shower unit (she does not use - sister helps with seated sink bath)         Home Equipment: Rolling Walker (2 wheels)          Prior Functioning/Environment Prior Level of Function : Independent/Modified Independent             Mobility Comments: Pt reports she ambulated independently without AD but furniture walks, denies falls in the past 6 months. ADLs Comments: Sister assists with cooking, cleaning & bathing but pt able to toilet independently. Sister sets out clothing and pt can dress herself.        OT Problem List: Decreased activity tolerance;Impaired balance (sitting and/or standing);Decreased knowledge of use of DME or AE;Decreased strength      OT Treatment/Interventions: Self-care/ADL training;Therapeutic exercise;Therapeutic activities;DME and/or AE instruction;Patient/family education;Balance training    OT Goals(Current goals can be found in the care plan section) Acute Rehab OT Goals Patient Stated Goal: figure out this heart stuff and go home OT Goal Formulation: With patient/family Time For Goal Achievement: 01/21/21 Potential to Achieve Goals: Good ADL Goals Additional ADL Goal #1: Pt/family will demonstrate independence using pulse oximeter to monitor HR.  OT Frequency: Min 2X/week   Barriers to D/C:            Co-evaluation              AM-PAC OT "6 Clicks" Daily Activity     Outcome Measure Help from another person eating meals?: None Help from another person taking care of personal grooming?: None Help from another person toileting, which includes using toliet, bedpan, or urinal?: A Little Help from another person bathing (including washing, rinsing, drying)?: A Little Help from another person to put on and taking off regular upper body clothing?: None Help from another person to put on and taking off regular lower body  clothing?: A Little 6 Click Score: 21   End of Session    Activity Tolerance: Patient tolerated treatment well Patient left: in bed;with call bell/phone within reach;with bed alarm set;with family/visitor present  OT Visit Diagnosis: Other abnormalities of gait and mobility (R26.89);Muscle weakness (generalized) (M62.81)                Time: 7124-5809 OT Time Calculation (min): 16 min Charges:  OT General Charges $OT Visit: 1 Visit OT Evaluation $OT Eval Low Complexity: 1 Low  Arman Filter., MPH, MS, OTR/L ascom [phone] 01/07/21, 5:00 PM

## 2021-01-07 NOTE — Progress Notes (Signed)
PT Cancellation Note  Patient Details Name: Cassandra Patterson MRN: [id number] DOB: 1928/05/04   Cancelled Treatment:    Reason Eval/Treat Not Completed: Other (comment) PT orders received, chart reviewed. Attempted to see pt for PT evaluation but echo at bedside. Will re-attempt later.  Aleda Grana, PT, DPT 01/07/21, 12:01 PM    Sandi Mariscal 01/07/2021, 12:00 PM

## 2021-01-07 NOTE — ED Notes (Signed)
MD Patel at bedside at this time.

## 2021-01-07 NOTE — ED Notes (Signed)
Lab at bedside

## 2021-01-07 NOTE — Progress Notes (Signed)
Received call from telemetry patient has 2nd degree heart block with pauses of 2-2.5 seconds, dropping complexes and HR dropped as lw as 29 BPM. Made Dr. Kirke Corin and Dr Allena Katz aware. New order for EKG, on chart. Dr. Kirke Corin stated that he would be by to see patient.

## 2021-01-08 ENCOUNTER — Inpatient Hospital Stay (HOSPITAL_COMMUNITY)
Admit: 2021-01-08 | Discharge: 2021-01-08 | Disposition: A | Discharge status: Home / Self Care | Payer: PPO | Attending: Medical | Admitting: Medical

## 2021-01-08 DIAGNOSIS — G459 Transient cerebral ischemic attack, unspecified: Secondary | ICD-10-CM | POA: Diagnosis not present

## 2021-01-08 DIAGNOSIS — I441 Atrioventricular block, second degree: Secondary | ICD-10-CM | POA: Diagnosis present

## 2021-01-08 DIAGNOSIS — R4702 Dysphasia: Secondary | ICD-10-CM | POA: Diagnosis present

## 2021-01-08 DIAGNOSIS — R1310 Dysphagia, unspecified: Secondary | ICD-10-CM | POA: Diagnosis present

## 2021-01-08 DIAGNOSIS — Z20822 Contact with and (suspected) exposure to covid-19: Secondary | ICD-10-CM | POA: Diagnosis present

## 2021-01-08 DIAGNOSIS — R001 Bradycardia, unspecified: Secondary | ICD-10-CM | POA: Diagnosis present

## 2021-01-08 DIAGNOSIS — I444 Left anterior fascicular block: Secondary | ICD-10-CM | POA: Diagnosis present

## 2021-01-08 DIAGNOSIS — R4701 Aphasia: Secondary | ICD-10-CM | POA: Diagnosis present

## 2021-01-08 DIAGNOSIS — Z79899 Other long term (current) drug therapy: Secondary | ICD-10-CM | POA: Diagnosis not present

## 2021-01-08 DIAGNOSIS — I959 Hypotension, unspecified: Secondary | ICD-10-CM | POA: Diagnosis present

## 2021-01-08 DIAGNOSIS — R29810 Facial weakness: Secondary | ICD-10-CM | POA: Diagnosis present

## 2021-01-08 DIAGNOSIS — H409 Unspecified glaucoma: Secondary | ICD-10-CM | POA: Diagnosis present

## 2021-01-08 DIAGNOSIS — I10 Essential (primary) hypertension: Secondary | ICD-10-CM | POA: Diagnosis present

## 2021-01-08 DIAGNOSIS — Z8249 Family history of ischemic heart disease and other diseases of the circulatory system: Secondary | ICD-10-CM | POA: Diagnosis not present

## 2021-01-08 MED ORDER — ASPIRIN 81 MG PO TBEC: 81.0000 mg | DELAYED_RELEASE_TABLET | Freq: Every day | ORAL | 11 refills | Status: DC

## 2021-01-08 NOTE — Discharge Summary (Signed)
[facility] [place] [facility]   PATIENT NAME: Cassandra Patterson    MR#:  [id number]  DATE OF BIRTH:  1928/07/08  DATE OF ADMISSION:  01/06/2021 ADMITTING PHYSICIAN: Enedina Finner, MD  DATE OF DISCHARGE: 01/08/21  PRIMARY CARE PHYSICIAN: Center, [facility]    ADMISSION DIAGNOSIS:  TIA (transient ischemic attack) [G45.9] Syncope, unspecified syncope type [R55]  DISCHARGE DIAGNOSIS:  altered level of consciousness/slurred speech-- suspected TIA bradycardia, history of 1st AV block-- now on Holter monitor SECONDARY DIAGNOSIS:   Past Medical History:  Diagnosis Date   Cataracts, bilateral    Glaucoma    Hypertension     HOSPITAL COURSE:  Cassandra Patterson is a 87 y.o. slime African-American female with medical history significant for essential hypertension and glaucoma, who presented to the ER with acute onset of right facial droop with drooling associated expressive dysphasia and dysarthria, noted by her sister earlier this evening   altered level of consciousness/slurred speech -- patient presented with working diagnosis of TIA -- she was seen and evaluated by neurology. Workup for TIA essentially unremarkable -- MRI brain negative for CVA -- ultrasound Doppler showed minor carotid atherosclerosis. Negative for stenosis -- echo bubble study showedLVEF 60-65%, no WMA, normal RV function, mildly elevated pulmonary artery pressure, mildly dilated L&R atrium, mild MR, mild to mod TR, negative bubble study - EKG showed SB 48bpm, PRI , LAD -- patient has first-degree AV block heart rate in the 40s with pauses intermittently -- seen by neurology. Recommends aspirin   history of bradycardia-- chronic history of first and second-degree AV block ?Tachy-brady syndrome -- she was seen by Dr. Okey Dupre in 2021. Holter monitor was placed on her at home -- pacemaker was discussed. However patient declined at that time -- cardiology consultation with Dr.  Kirke Corin. -- patient is not on any rate blocking agent -- TSH 0.9 --11/22--pt's HR now 70--115. Dr. Okey Dupre recommends Zio/holter monitor 14 days and okay to discharge home   Hypertension -- hold hydrochlorothiazide -- on plendil   Glaucoma -- continue her home eyedrops     Procedures: Family communication : sister at bedside Consults : cardiology CODE STATUS: full DVT Prophylaxis : Lovenox Level of care: Telemetry Medical Status is: inpatient    patient will discharged to home with outpatient follow-up cardiology      CONSULTS OBTAINED:  Treatment Team:  Gordy Councilman, MD Iran Ouch, MD  DRUG ALLERGIES:  No Known Allergies  DISCHARGE MEDICATIONS:   Allergies as of 01/08/2021   No Known Allergies      Medication List     STOP taking these medications    fluticasone 50 MCG/ACT nasal spray Commonly known as: FLONASE   hydrochlorothiazide 12.5 MG capsule Commonly known as: MICROZIDE       TAKE these medications    acetaminophen 650 MG CR tablet Commonly known as: TYLENOL Take 650 mg by mouth every 8 (eight) hours as needed for pain.   ammonium lactate 12 % cream Commonly known as: AMLACTIN Apply topically as needed.   aspirin 81 MG EC tablet Take 1 tablet (81 mg total) by mouth daily. Swallow whole. Start taking on: January 09, 2021   brimonidine 0.15 % ophthalmic solution Commonly known as: ALPHAGAN Place 1 drop into both eyes 2 (two) times daily.   COD LIVER OIL PO Take by mouth as needed.   felodipine 5 MG 24 hr tablet Commonly known as: PLENDIL TAKE 1 TABLET(5 MG) BY MOUTH DAILY  ONE-A-DAY WOMENS PO Take 1 tablet by mouth daily.   Rhopressa 0.02 % Soln Generic drug: Netarsudil Dimesylate Place 1 drop into the right eye at bedtime.   Travoprost (BAK Free) 0.004 % Soln ophthalmic solution Commonly known as: TRAVATAN INSTILL 1 DROP BOTH EYES AT NIGHTTIME        If you experience worsening of your admission symptoms,  develop shortness of breath, life threatening emergency, suicidal or homicidal thoughts you must seek medical attention immediately by calling 911 or calling your MD immediately  if symptoms less severe.  You Must read complete instructions/literature along with all the possible adverse reactions/side effects for all the Medicines you take and that have been prescribed to you. Take any new Medicines after you have completely understood and accept all the possible adverse reactions/side effects.   Please note  You were cared for by a hospitalist during your hospital stay. If you have any questions about your discharge medications or the care you received while you were in the hospital after you are discharged, you can call the unit and asked to speak with the hospitalist on call if the hospitalist that took care of you is not available. Once you are discharged, your primary care physician will handle any further medical issues. Please note that NO REFILLS for any discharge medications will be authorized once you are discharged, as it is imperative that you return to your primary care physician (or establish a relationship with a primary care physician if you do not have one) for your aftercare needs so that they can reassess your need for medications and monitor your lab values.  DATA REVIEW:   CBC  Recent Labs  Lab 01/06/21 2029  WBC 5.1  HGB 12.7  HCT 41.2  PLT 185    Chemistries  Recent Labs  Lab 01/06/21 2029 01/07/21 0614  NA 138  --   K 4.1  --   CL 106  --   CO2 28  --   GLUCOSE 112*  --   BUN 36*  --   CREATININE 0.79  --   CALCIUM 8.9  --   MG  --  1.8  AST 19  --   ALT 17  --   ALKPHOS 48  --   BILITOT 0.5  --     Microbiology Results   Recent Results (from the past 240 hour(s))  Resp Panel by RT-PCR (Flu A&B, Covid) Nasopharyngeal Swab     Status: None   Collection Time: 01/06/21  8:23 PM   Specimen: Nasopharyngeal Swab; Nasopharyngeal(NP) swabs in vial  transport medium  Result Value Ref Range Status   SARS Coronavirus 2 by RT PCR NEGATIVE NEGATIVE Final    Comment: (NOTE) SARS-CoV-2 target nucleic acids are NOT DETECTED.  The SARS-CoV-2 RNA is generally detectable in upper respiratory specimens during the acute phase of infection. The lowest concentration of SARS-CoV-2 viral copies this assay can detect is 138 copies/mL. A negative result does not preclude SARS-Cov-2 infection and should not be used as the sole basis for treatment or other patient management decisions. A negative result may occur with  improper specimen collection/handling, submission of specimen other than nasopharyngeal swab, presence of viral mutation(s) within the areas targeted by this assay, and inadequate number of viral copies(<138 copies/mL). A negative result must be combined with clinical observations, patient history, and epidemiological information. The expected result is Negative.  Fact Sheet for Patients:  BloggerCourse.com  Fact Sheet for Healthcare Providers:  SeriousBroker.it  This  test is no t yet approved or cleared by the [place] and  has been authorized for detection and/or diagnosis of SARS-CoV-2 by FDA under an Emergency Use Authorization (EUA). This EUA will remain  in effect (meaning this test can be used) for the duration of the COVID-19 declaration under Section 564(b)(1) of the Act, 21 U.S.C.section 360bbb-3(b)(1), unless the authorization is terminated  or revoked sooner.       Influenza A by PCR NEGATIVE NEGATIVE Final   Influenza B by PCR NEGATIVE NEGATIVE Final    Comment: (NOTE) The Xpert Xpress SARS-CoV-2/FLU/RSV plus assay is intended as an aid in the diagnosis of influenza from Nasopharyngeal swab specimens and should not be used as a sole basis for treatment. Nasal washings and aspirates are unacceptable for Xpert Xpress SARS-CoV-2/FLU/RSV testing.  Fact  Sheet for Patients: BloggerCourse.com  Fact Sheet for Healthcare Providers: SeriousBroker.it  This test is not yet approved or cleared by the [place] FDA and has been authorized for detection and/or diagnosis of SARS-CoV-2 by FDA under an Emergency Use Authorization (EUA). This EUA will remain in effect (meaning this test can be used) for the duration of the COVID-19 declaration under Section 564(b)(1) of the Act, 21 U.S.C. section 360bbb-3(b)(1), unless the authorization is terminated or revoked.  Performed at [facility], [address] [place], [place] [address]     RADIOLOGY:  DG Chest 2 View  Result Date: 01/06/2021 CLINICAL DATA:  Weakness and slurred speech. EXAM: CHEST - 2 VIEW COMPARISON:  None. FINDINGS: The heart is moderately enlarged. The central vessels are normal caliber. There is no evidence of edema or focal pulmonary infiltrate, no pleural effusion is seen. The aorta is tortuous, ectatic, and with patchy calcifications. There is osteopenia and mild thoracic kyphosis. IMPRESSION: No evidence of acute chest disease. Cardiomegaly. Aortic atherosclerosis. Electronically Signed   By: Almira Bar M.D.   On: 01/06/2021 20:46   CT Head Wo Contrast  Result Date: 01/06/2021 CLINICAL DATA:  Neuro deficit with stroke suspected. Patient indicates transient slurred speech which resolved on arrival of EMS. EXAM: CT HEAD WITHOUT CONTRAST TECHNIQUE: Contiguous axial images were obtained from the base of the skull through the vertex without intravenous contrast. COMPARISON:  CT 04/06/2017 FINDINGS: Brain: There is moderately advanced cerebral atrophy with atrophic ventriculomegaly and mild small vessel disease of the cerebral white matter, with mild cerebellar atrophy. Findings were noted previously. There is no midline shift no focal asymmetry worrisome for acute infarct, hemorrhage or mass. There are dural  calcifications scattered along the falx and tiny chronic bilateral gangliocapsular lacunar infarctions also seen previously. Vascular: There patchy calcifications of the carotid siphons but no hyperdense central vasculature. There is trace air in the right cavernous sinus, small branching lucencies in the left temporal scalp consistent with additional intravenous air, likely iatrogenic. Skull: Osteopenia. No depressed skull fracture or focal skull lesion. Sinuses/Orbits: No acute finding.  Old lens extractions. Other: None. IMPRESSION: 1. No evidence of cortical based infarct or hemorrhage , no hyperdense central vasculature. 2. Trace air in the right cavernous sinus and additional intravenous air in the left temporal scalp. This likely is iatrogenic. No evidence of skull base fracture is seen. No air is seen in the cortical veins of either hemisphere. 3. Chronic change. Electronically Signed   By: Almira Bar M.D.   On: 01/06/2021 20:55   MR BRAIN WO CONTRAST  Result Date: 01/07/2021 CLINICAL DATA:  Acute neurologic deficit EXAM: MRI HEAD WITHOUT CONTRAST TECHNIQUE:  Multiplanar, multiecho pulse sequences of the brain and surrounding structures were obtained without intravenous contrast. COMPARISON:  None. FINDINGS: Brain: No acute infarct, mass effect or extra-axial collection. No acute or chronic hemorrhage. There is multifocal hyperintense T2-weighted signal within the white matter. Generalized volume loss without a clear lobar predilection. Old right cerebellar small vessel infarct. The midline structures are normal. Vascular: Major flow voids are preserved. Skull and upper cervical spine: Normal calvarium and skull base. Visualized upper cervical spine and soft tissues are normal. Sinuses/Orbits:No paranasal sinus fluid levels or advanced mucosal thickening. No mastoid or middle ear effusion. Normal orbits. IMPRESSION: 1. No acute intracranial abnormality. 2. Findings of chronic ischemic microangiopathy  and generalized volume loss. Electronically Signed   By: Deatra Robinson M.D.   On: 01/07/2021 01:59   US Carotid Bilateral (at [facility] and [facility] only)  Result Date: 01/07/2021 CLINICAL DATA:  TIA symptoms, hypertension, stroke EXAM: BILATERAL CAROTID DUPLEX ULTRASOUND TECHNIQUE: Wallace Cullens scale imaging, color Doppler and duplex ultrasound were performed of bilateral carotid and vertebral arteries in the neck. COMPARISON:  None. FINDINGS: Criteria: Quantification of carotid stenosis is based on velocity parameters that correlate the residual internal carotid diameter with NASCET-based stenosis levels, using the diameter of the distal internal carotid lumen as the denominator for stenosis measurement. The following velocity measurements were obtained: RIGHT ICA: 67/17 cm/sec CCA: 34 cm/sec SYSTOLIC ICA/CCA RATIO:  2.0 ECA: 43 cm/sec LEFT ICA: 63/16 cm/sec CCA: 39 cm/sec SYSTOLIC ICA/CCA RATIO:  1.6 ECA: 49 cm/sec RIGHT CAROTID ARTERY: Minor echogenic shadowing plaque formation. No hemodynamically significant right ICA stenosis, velocity elevation, or turbulent flow. Degree of narrowing less than 50%. RIGHT VERTEBRAL ARTERY:  Normal antegrade flow LEFT CAROTID ARTERY: Similar scattered minor echogenic plaque formation. No hemodynamically significant left ICA stenosis, velocity elevation, or turbulent flow. LEFT VERTEBRAL ARTERY:  Normal antegrade flow IMPRESSION: Minor carotid atherosclerosis. Negative for stenosis. Degree of narrowing less than 50% bilaterally by ultrasound criteria. Patent antegrade vertebral flow bilaterally Electronically Signed   By: Judie Petit.  Shick M.D.   On: 01/07/2021 08:35   ECHOCARDIOGRAM LIMITED BUBBLE STUDY  Result Date: 01/07/2021    ECHOCARDIOGRAM LIMITED REPORT   Patient Name:   VIENO TARRANT Date of Exam: 01/07/2021 Medical Rec #:  [id number]           Height:       61.0 in Accession #:    [id number]          Weight:       101.4 lb Date of Birth:  1929-01-17           BSA:          1.415 m  Patient Age:    87 years            BP:           152/77 mmHg Patient Gender: F                   HR:           41 bpm. Exam Location:  [facility] Procedure: Limited Echo, Limited Color Doppler, Cardiac Doppler and Saline            Contrast Bubble Study Indications:     G45.9 TIA  History:         Patient has prior history of Echocardiogram examinations, most                  recent 02/29/2020. Arrythmias:2nd degree heart block; Risk  Factors:Hypertension.  Sonographer:     Humphrey Rolls Referring Phys:  [id number] JAN A MANSY Diagnosing Phys: Lorine Bears MD IMPRESSIONS  1. Left ventricular ejection fraction, by estimation, is 60 to 65%. The left ventricle has normal function. The left ventricle has no regional wall motion abnormalities.  2. Right ventricular systolic function is normal. The right ventricular size is normal. There is mildly elevated pulmonary artery systolic pressure.  3. Left atrial size was mildly dilated.  4. Right atrial size was mildly dilated.  5. A small pericardial effusion is present. The pericardial effusion is posterior to the left ventricle.  6. The mitral valve is normal in structure. Mild mitral valve regurgitation. No evidence of mitral stenosis.  7. Tricuspid valve regurgitation is mild to moderate.  8. The aortic valve is normal in structure. Aortic valve regurgitation is mild. Aortic valve sclerosis/calcification is present, without any evidence of aortic stenosis.  9. The inferior vena cava is normal in size with greater than 50% respiratory variability, suggesting right atrial pressure of 3 mmHg. 10. Agitated saline contrast bubble study was negative, with no evidence of any interatrial shunt. FINDINGS  Left Ventricle: Left ventricular ejection fraction, by estimation, is 60 to 65%. The left ventricle has normal function. The left ventricle has no regional wall motion abnormalities. The left ventricular internal cavity size was normal in size. There is  no left ventricular  hypertrophy. Right Ventricle: The right ventricular size is normal. No increase in right ventricular wall thickness. Right ventricular systolic function is normal. There is mildly elevated pulmonary artery systolic pressure. The tricuspid regurgitant velocity is 2.81  m/s, and with an assumed right atrial pressure of 5 mmHg, the estimated right ventricular systolic pressure is 36.6 mmHg. Left Atrium: Left atrial size was mildly dilated. Right Atrium: Right atrial size was mildly dilated. Pericardium: A small pericardial effusion is present. The pericardial effusion is posterior to the left ventricle. Mitral Valve: The mitral valve is normal in structure. There is mild thickening of the mitral valve leaflet(s). Mild mitral valve regurgitation. No evidence of mitral valve stenosis. Tricuspid Valve: The tricuspid valve is normal in structure. Tricuspid valve regurgitation is mild to moderate. No evidence of tricuspid stenosis. Aortic Valve: The aortic valve is normal in structure. Aortic valve regurgitation is mild. Aortic valve sclerosis/calcification is present, without any evidence of aortic stenosis. Pulmonic Valve: The pulmonic valve was normal in structure. Pulmonic valve regurgitation is not visualized. No evidence of pulmonic stenosis. Aorta: The aortic root is normal in size and structure. Venous: The inferior vena cava is normal in size with greater than 50% respiratory variability, suggesting right atrial pressure of 3 mmHg. IAS/Shunts: No atrial level shunt detected by color flow Doppler. Agitated saline contrast was given intravenously to evaluate for intracardiac shunting. Agitated saline contrast bubble study was negative, with no evidence of any interatrial shunt. LEFT VENTRICLE PLAX 2D LVIDd:         3.43 cm LVIDs:         2.19 cm LV PW:         0.97 cm LV IVS:        0.77 cm  LEFT ATRIUM         Index LA diam:    2.20 cm 1.55 cm/m  TRICUSPID VALVE TR Peak grad:   31.6 mmHg TR Vmax:        281.00 cm/s  Lorine Bears MD Electronically signed by Lorine Bears MD Signature Date/Time: 01/07/2021/2:31:52 PM    Final  CODE STATUS:     Code Status Orders  (From admission, onward)           Start     Ordered   01/07/21 0008  Full code  Continuous        01/07/21 0009           Code Status History     This patient has a current code status but no historical code status.        TOTAL TIME TAKING CARE OF THIS PATIENT: 40 minutes.    Enedina Finner M.D  [facility]    CC: Primary care physician; [facility], [facility]

## 2021-01-08 NOTE — Progress Notes (Addendum)
Occupational Therapy Treatment Patient Details Name: Cassandra Patterson MRN: [id number] DOB: 03/16/1928 Today's Date: 01/08/2021   History of present illness Pt is a 87 y/o F who presented to the ED with c/o sudden onset slurred speech but upon EMS arrival pt's speech seemed to improve. MRI reveals no acute infarct. PMH: HTN, mobitz type 1 second degree heart block, B cataracts, glaucoma   OT comments  Pt seen for f/u regarding safe use of AE/DME for ADL management, falls prevention strategies, and reinforcement of prior education on pulse oximeter for healthcare maintenance upon DC home. Pt with supportive family at bedside, denies pain and agreeable to tx session. Participates actively t/o with teach back of pulse ox given moderate cueing from caregiver. Pt/caregivers educated on falls prevention strategies and safe use of RW for functional mobility. Additional non-slip socks provided for pt. Pt/caregivers verbalize understanding of all education provided. SW notified pt would benefit from pulse oximeter for home use. All questions answered at this time. Pt continues to benefit from skilled OT services to maximize safety and functional independence with ADL management upon DC home. Will continue to follow POC as written. DC remains appropriate.    Recommendations for follow up therapy are one component of a multi-disciplinary discharge planning process, led by the attending physician.  Recommendations may be updated based on patient status, additional functional criteria and insurance authorization.    Follow Up Recommendations  No OT follow up    Assistance Recommended at Discharge    Equipment Recommendations  Other (comment) (pulse oximeter)    Recommendations for Other Services      Precautions / Restrictions Precautions Precautions: Fall Restrictions Weight Bearing Restrictions: No       Mobility Bed Mobility               General bed mobility comments: Deferred. Pt in  reclienr at start/end of session.    Transfers   Equipment used: Rolling walker (2 wheels)   Sit to Stand: Supervision           General transfer comment: from recliner, educated on safe STS technique.     Balance Overall balance assessment: Needs assistance Sitting-balance support: Feet supported;No upper extremity supported Sitting balance-Leahy Scale: Fair     Standing balance support: Single extremity supported;Bilateral upper extremity supported Standing balance-Leahy Scale: Fair                             ADL either performed or assessed with clinical judgement   ADL Overall ADL's : At baseline                                       General ADL Comments: Pt continues to be able to perform dressing tasks and toileting at/near baseline with sister/s able to assist as needed. Reviewed previous education on use of pulse ox for improved healthcare maintenance and falls prevention strategies with emphasis on safe use of RW for functional mobility.    Extremity/Trunk Assessment Upper Extremity Assessment Upper Extremity Assessment: Overall WFL for tasks assessed   Lower Extremity Assessment Lower Extremity Assessment: Generalized weakness        Vision Ability to See in Adequate Light: 1 Impaired Patient Visual Report: No change from baseline     Perception     Praxis      Cognition Arousal/Alertness: Awake/alert Behavior During  Therapy: WFL for tasks assessed/performed Overall Cognitive Status: Within Functional Limits for tasks assessed                                 General Comments: Pleasant, conversational, eager to participate in therapy session.          Exercises Other Exercises Other Exercises: Pt/caregivers educated on use of pulse oximeter, safe use of AE/DME for adl management, and falls prevention strategies. All return verbalize understanding of education provided. Pt able to perform teach-back of  use of pulse ox with moderate cueing from caretiver t/o.   Shoulder Instructions       General Comments HR 64-68 bpm during session.    Pertinent Vitals/ Pain       Pain Assessment: No/denies pain  Home Living                                          Prior Functioning/Environment              Frequency  Min 2X/week        Progress Toward Goals  OT Goals(current goals can now be found in the care plan section)  Progress towards OT goals: Progressing toward goals  Acute Rehab OT Goals Patient Stated Goal: To go home and get back to my regular routine OT Goal Formulation: With patient/family Time For Goal Achievement: 01/21/21 Potential to Achieve Goals: Good  Plan Discharge plan remains appropriate;Frequency remains appropriate    Co-evaluation                 AM-PAC OT "6 Clicks" Daily Activity     Outcome Measure   Help from another person eating meals?: None Help from another person taking care of personal grooming?: None Help from another person toileting, which includes using toliet, bedpan, or urinal?: A Little Help from another person bathing (including washing, rinsing, drying)?: A Little Help from another person to put on and taking off regular upper body clothing?: None Help from another person to put on and taking off regular lower body clothing?: A Little 6 Click Score: 21    End of Session Equipment Utilized During Treatment: Rolling walker (2 wheels);Other (comment) (pulse ox)  OT Visit Diagnosis: Other abnormalities of gait and mobility (R26.89);Muscle weakness (generalized) (M62.81)   Activity Tolerance Patient tolerated treatment well   Patient Left in chair;with call bell/phone within reach;with chair alarm set;with family/visitor present   Nurse Communication          Time: 6063-0160 OT Time Calculation (min): 26 min  Charges: OT General Charges $OT Visit: 1 Visit OT Treatments $Self Care/Home  Management : 23-37 mins  Rockney Ghee, M.S., OTR/L [facility] - [facility] Ascom: [phone] 01/08/21, 10:36 AM

## 2021-01-08 NOTE — Progress Notes (Signed)
Physical Therapy Treatment Patient Details Name: Cassandra Patterson MRN: [id number] DOB: [birth year] Today's Date: 01/08/2021   History of Present Illness Pt is a 87 y/o F who presented to the ED with c/o sudden onset slurred speech but upon EMS arrival pt's speech seemed to improve. MRI reveals no acute infarct. PMH: HTN, mobitz type 1 second degree heart block, B cataracts, glaucoma    PT Comments    Pt is making good progress towards goals. Monitored HR with exertion. Educated pt and family about use of RW at all times for safety. Able to ambulate around RN station with improved endurance. Eager to [employer] this date. Left with all need intact.    Recommendations for follow up therapy are one component of a multi-disciplinary discharge planning process, led by the attending physician.  Recommendations may be updated based on patient status, additional functional criteria and insurance authorization.  Follow Up Recommendations  Home health PT     Assistance Recommended at Discharge Frequent or constant Supervision/Assistance  Equipment Recommendations  None recommended by PT    Recommendations for Other Services       Precautions / Restrictions Precautions Precautions: Fall Restrictions Weight Bearing Restrictions: No     Mobility  Bed Mobility               General bed mobility comments: Deferred. Pt in reclienr at start/end of session.    Transfers Overall transfer level: Needs assistance Equipment used: Rolling walker (2 wheels) Transfers: Sit to/from Stand Sit to Stand: Supervision           General transfer comment: safe technique with cues for hand placement    Ambulation/Gait Ambulation/Gait assistance: Min guard Gait Distance (Feet): 200 Feet Assistive device: Rolling walker (2 wheels) Gait Pattern/deviations: Step-through pattern       General Gait Details: ambulated around RN station with improved fluid gait pattern. Needs cues for directions and  cga for turning. 2nd half of ambulation (approx 100') performed without AD with HHA. Narrow BOS and very short step length. Unsteady. HR remained at 70bpm post exertion.   Stairs             Wheelchair Mobility    Modified Rankin (Stroke Patients Only)       Balance Overall balance assessment: Needs assistance Sitting-balance support: Feet supported;No upper extremity supported Sitting balance-Leahy Scale: Good     Standing balance support: Bilateral upper extremity supported Standing balance-Leahy Scale: Good                              Cognition Arousal/Alertness: Awake/alert Behavior During Therapy: WFL for tasks assessed/performed Overall Cognitive Status: Within Functional Limits for tasks assessed                                 General Comments: Pleasant, conversational, eager to participate in therapy session.        Exercises      General Comments        Pertinent Vitals/Pain Pain Assessment: No/denies pain    Home Living                          Prior Function            PT Goals (current goals can now be found in the care plan section) Acute Rehab PT Goals Patient Stated  Goal: get better, go home PT Goal Formulation: With patient/family Time For Goal Achievement: 01/21/21 Potential to Achieve Goals: Good Progress towards PT goals: Progressing toward goals    Frequency    Min 2X/week      PT Plan Current plan remains appropriate    Co-evaluation              AM-PAC PT "6 Clicks" Mobility   Outcome Measure  Help needed turning from your back to your side while in a flat bed without using bedrails?: None Help needed moving from lying on your back to sitting on the side of a flat bed without using bedrails?: A Little Help needed moving to and from a bed to a chair (including a wheelchair)?: A Little Help needed standing up from a chair using your arms (e.g., wheelchair or bedside  chair)?: A Little Help needed to walk in hospital room?: A Little Help needed climbing 3-5 steps with a railing? : A Little 6 Click Score: 19    End of Session Equipment Utilized During Treatment: Gait belt Activity Tolerance: Patient tolerated treatment well Patient left: in chair;with chair alarm set Nurse Communication: Mobility status PT Visit Diagnosis: Muscle weakness (generalized) (M62.81);Unsteadiness on feet (R26.81)     Time: 3299-2426 PT Time Calculation (min) (ACUTE ONLY): 13 min  Charges:  $Gait Training: 8-22 mins                     Avaley Palau, [place], DPT [phone]    Cassandra Patterson 01/08/2021, 2:55 PM

## 2021-01-08 NOTE — TOC Initial Note (Signed)
Transition of Care [facility]) - Initial/Assessment Note    Patient Details  Name: Cassandra Patterson MRN: [id number] Date of Birth: [birth year]  Transition of Care [facility]) CM/SW Contact:    Candie Chroman, LCSW Phone Number: 01/08/2021, 2:39 PM  Clinical Narrative:  CSW met with patient. Sister at bedside. CSW introduced role and explained that PT recommendations would be discussed. Patient and her sister are agreeable to home health PT. [facility], [place], [place], [place], [place], [place], and [place] are unable to accept referral. [facility] is reviewing. [facility] wouldn't be able to start services until next week. Left message for [facility] representative. Patient needs pulse ox. Looked up Restaurant manager, fast food. Cheapest option at [facility] is $6.99 but prices vary. Sister confirmed they can afford to purchase one. No other DME needs.               Expected Discharge Plan: [place] Barriers to Discharge: No Barriers Identified   Patient Goals and CMS Choice   CMS Medicare.gov Compare Post Acute Care list provided to:: Patient (Sister at bedside)    Expected Discharge Plan and Services Expected Discharge Plan: [place]     Post Acute Care Choice: [place] arrangements for the past 2 months: Single Family Home Expected Discharge Date: 01/08/21                                    Prior Living Arrangements/Services Living arrangements for the past 2 months: Single Family Home Lives with:: Self Patient language and need for interpreter reviewed:: Yes Do you feel safe going back to the place where you live?: Yes      Need for Family Participation in Patient Care: Yes (Comment) Care giver support system in place?: Yes (comment) Current home services: DME Criminal Activity/Legal Involvement Pertinent to Current Situation/Hospitalization: No - Comment as needed  Activities of Daily Living Home Assistive Devices/Equipment: Dentures (specify  type), Raised toilet seat with rails ADL Screening (condition at time of admission) Patient's cognitive ability adequate to safely complete daily activities?: Yes Is the patient deaf or have difficulty hearing?: Yes Does the patient have difficulty seeing, even when wearing glasses/contacts?: No Does the patient have difficulty concentrating, remembering, or making decisions?: Yes Patient able to express need for assistance with ADLs?: Yes Does the patient have difficulty dressing or bathing?: No Independently performs ADLs?: Yes (appropriate for developmental age) Does the patient have difficulty walking or climbing stairs?: Yes Weakness of Legs: Both Weakness of Arms/Hands: None  Permission Sought/Granted Permission sought to share information with : Facility Sport and exercise psychologist, Family Supports Permission granted to share information with : Yes, Verbal Permission Granted  Share Information with NAME: Finlay Mills  Permission granted to share info w AGENCY: [place] granted to share info w Relationship: Sister  Permission granted to share info w Contact Information: [phone]  Emotional Assessment Appearance:: Appears stated age Attitude/Demeanor/Rapport: Engaged, Gracious Affect (typically observed): Accepting, Appropriate, Calm, Pleasant Orientation: : Oriented to Self, Oriented to Place, Oriented to  Time, Oriented to Situation Alcohol / Substance Use: Not Applicable Psych Involvement: No (comment)  Admission diagnosis:  TIA (transient ischemic attack) [G45.9] Syncope, unspecified syncope type [R55] Patient Active Problem List   Diagnosis Date Noted   TIA (transient ischemic attack) 01/06/2021   Mobitz type 1 second degree AV block 02/22/2020   Palpitations 01/26/2020   Heart murmur 01/26/2020  Primary hypertension 01/26/2020   Bradycardia 01/26/2020   First degree AV block 01/26/2020   PCP:  Center, [place]:    [id number] [address], [address] [place] [address] Phone: [phone] Fax: [phone]     Social Determinants of Health (SDOH) Interventions    Readmission Risk Interventions No flowsheet data found.

## 2021-01-08 NOTE — Discharge Instructions (Signed)
Wear your Heart monitor as instructed

## 2021-01-08 NOTE — Progress Notes (Signed)
Progress Note  Patient Name: Cassandra Patterson Date of Encounter: 01/08/2021  [facility] Cardiologist: Yvonne Kendall, MD   Subjective   Patient feels well without chest pain, shortness of breath, palpitations, or lightheadedness.  Family has not witnessed any further episodes like what brought her into the hospital.  Inpatient Medications    Scheduled Meds:   stroke: mapping our early stages of recovery book   Does not apply Once   aspirin EC  81 mg Oral Daily   atorvastatin  10 mg Oral Daily   brimonidine  1 drop Both Eyes BID   enoxaparin (LOVENOX) injection  40 mg Subcutaneous Q24H   felodipine  5 mg Oral Daily   latanoprost  1 drop Both Eyes QHS   multivitamin with minerals   Oral Daily   Continuous Infusions:  PRN Meds: acetaminophen **OR** acetaminophen (TYLENOL) oral liquid 160 mg/5 mL **OR** acetaminophen, ammonium lactate, senna-docusate   Vital Signs    Vitals:   01/08/21 0054 01/08/21 0453 01/08/21 0823 01/08/21 1214  BP: 111/61 135/64 136/79 120/66  Pulse: (!) 53 (!) 51 (!) 53 (!) 51  Resp: 16 16 18 17   Temp: 99.1 F (37.3 C) 98.4 F (36.9 C) 97.7 F (36.5 C) (!) 97.3 F (36.3 C)  TempSrc:  Oral Oral Oral  SpO2: 98% 96% 100% 100%  Weight:      Height:        Intake/Output Summary (Last 24 hours) at 01/08/2021 1508 Last data filed at 01/08/2021 1356 Gross per 24 hour  Intake 160 ml  Output --  Net 160 ml   Last 3 Weights 01/06/2021 06/06/2020 02/22/2020  Weight (lbs) 101 lb 6.4 oz 95 lb 97 lb 2 oz  Weight (kg) 45.995 kg 43.092 kg 44.056 kg      Telemetry    Sinus rhythm with frequent Mobitz type I second-degree AV block.  Longest RR interval 3.1 seconds. - Personally Reviewed  ECG    No new tracing.  Physical Exam   GEN: No acute distress.   Neck: No JVD Cardiac: RRR with 1/6 systolic murmur. Respiratory: Clear to auscultation bilaterally. GI: Soft, nontender, non-distended  MS: No edema; No deformity. Neuro:  Nonfocal   Psych: Normal affect   Labs    High Sensitivity Troponin:   Recent Labs  Lab 01/06/21 2029 01/07/21 0614  TROPONINIHS 7 8     Chemistry Recent Labs  Lab 01/06/21 2029 01/07/21 0614  NA 138  --   K 4.1  --   CL 106  --   CO2 28  --   GLUCOSE 112*  --   BUN 36*  --   CREATININE 0.79  --   CALCIUM 8.9  --   MG  --  1.8  PROT 6.1*  --   ALBUMIN 3.2*  --   AST 19  --   ALT 17  --   ALKPHOS 48  --   BILITOT 0.5  --   GFRNONAA >60  --   ANIONGAP 4*  --     Lipids  Recent Labs  Lab 01/07/21 0614  CHOL 156  TRIG 30  HDL 67  LDLCALC 83  CHOLHDL 2.3    Hematology Recent Labs  Lab 01/06/21 2029  WBC 5.1  RBC 5.11  HGB 12.7  HCT 41.2  MCV 80.6  MCH 24.9*  MCHC 30.8  RDW 13.7  PLT 185   Thyroid  Recent Labs  Lab 01/07/21 0614  TSH 0.973  BNPNo results for input(s): BNP, PROBNP in the last 168 hours.  DDimer No results for input(s): DDIMER in the last 168 hours.   Radiology    DG Chest 2 View  Result Date: 01/06/2021 CLINICAL DATA:  Weakness and slurred speech. EXAM: CHEST - 2 VIEW COMPARISON:  None. FINDINGS: The heart is moderately enlarged. The central vessels are normal caliber. There is no evidence of edema or focal pulmonary infiltrate, no pleural effusion is seen. The aorta is tortuous, ectatic, and with patchy calcifications. There is osteopenia and mild thoracic kyphosis. IMPRESSION: No evidence of acute chest disease. Cardiomegaly. Aortic atherosclerosis. Electronically Signed   By: Almira Bar M.D.   On: 01/06/2021 20:46   CT Head Wo Contrast  Result Date: 01/06/2021 CLINICAL DATA:  Neuro deficit with stroke suspected. Patient indicates transient slurred speech which resolved on arrival of EMS. EXAM: CT HEAD WITHOUT CONTRAST TECHNIQUE: Contiguous axial images were obtained from the base of the skull through the vertex without intravenous contrast. COMPARISON:  CT 04/06/2017 FINDINGS: Brain: There is moderately advanced cerebral atrophy  with atrophic ventriculomegaly and mild small vessel disease of the cerebral white matter, with mild cerebellar atrophy. Findings were noted previously. There is no midline shift no focal asymmetry worrisome for acute infarct, hemorrhage or mass. There are dural calcifications scattered along the falx and tiny chronic bilateral gangliocapsular lacunar infarctions also seen previously. Vascular: There patchy calcifications of the carotid siphons but no hyperdense central vasculature. There is trace air in the right cavernous sinus, small branching lucencies in the left temporal scalp consistent with additional intravenous air, likely iatrogenic. Skull: Osteopenia. No depressed skull fracture or focal skull lesion. Sinuses/Orbits: No acute finding.  Old lens extractions. Other: None. IMPRESSION: 1. No evidence of cortical based infarct or hemorrhage , no hyperdense central vasculature. 2. Trace air in the right cavernous sinus and additional intravenous air in the left temporal scalp. This likely is iatrogenic. No evidence of skull base fracture is seen. No air is seen in the cortical veins of either hemisphere. 3. Chronic change. Electronically Signed   By: Almira Bar M.D.   On: 01/06/2021 20:55   MR BRAIN WO CONTRAST  Result Date: 01/07/2021 CLINICAL DATA:  Acute neurologic deficit EXAM: MRI HEAD WITHOUT CONTRAST TECHNIQUE: Multiplanar, multiecho pulse sequences of the brain and surrounding structures were obtained without intravenous contrast. COMPARISON:  None. FINDINGS: Brain: No acute infarct, mass effect or extra-axial collection. No acute or chronic hemorrhage. There is multifocal hyperintense T2-weighted signal within the white matter. Generalized volume loss without a clear lobar predilection. Old right cerebellar small vessel infarct. The midline structures are normal. Vascular: Major flow voids are preserved. Skull and upper cervical spine: Normal calvarium and skull base. Visualized upper  cervical spine and soft tissues are normal. Sinuses/Orbits:No paranasal sinus fluid levels or advanced mucosal thickening. No mastoid or middle ear effusion. Normal orbits. IMPRESSION: 1. No acute intracranial abnormality. 2. Findings of chronic ischemic microangiopathy and generalized volume loss. Electronically Signed   By: Deatra Robinson M.D.   On: 01/07/2021 01:59   US Carotid Bilateral (at [facility] and [facility] only)  Result Date: 01/07/2021 CLINICAL DATA:  TIA symptoms, hypertension, stroke EXAM: BILATERAL CAROTID DUPLEX ULTRASOUND TECHNIQUE: Wallace Cullens scale imaging, color Doppler and duplex ultrasound were performed of bilateral carotid and vertebral arteries in the neck. COMPARISON:  None. FINDINGS: Criteria: Quantification of carotid stenosis is based on velocity parameters that correlate the residual internal carotid diameter with NASCET-based stenosis levels, using the diameter of the  distal internal carotid lumen as the denominator for stenosis measurement. The following velocity measurements were obtained: RIGHT ICA: 67/17 cm/sec CCA: 34 cm/sec SYSTOLIC ICA/CCA RATIO:  2.0 ECA: 43 cm/sec LEFT ICA: 63/16 cm/sec CCA: 39 cm/sec SYSTOLIC ICA/CCA RATIO:  1.6 ECA: 49 cm/sec RIGHT CAROTID ARTERY: Minor echogenic shadowing plaque formation. No hemodynamically significant right ICA stenosis, velocity elevation, or turbulent flow. Degree of narrowing less than 50%. RIGHT VERTEBRAL ARTERY:  Normal antegrade flow LEFT CAROTID ARTERY: Similar scattered minor echogenic plaque formation. No hemodynamically significant left ICA stenosis, velocity elevation, or turbulent flow. LEFT VERTEBRAL ARTERY:  Normal antegrade flow IMPRESSION: Minor carotid atherosclerosis. Negative for stenosis. Degree of narrowing less than 50% bilaterally by ultrasound criteria. Patent antegrade vertebral flow bilaterally Electronically Signed   By: Judie Petit.  Shick M.D.   On: 01/07/2021 08:35   ECHOCARDIOGRAM LIMITED BUBBLE STUDY  Result Date: 01/07/2021     ECHOCARDIOGRAM LIMITED REPORT   Patient Name:   JACKELYN ILLINGWORTH Date of Exam: 01/07/2021 Medical Rec #:  [id number]           Height:       61.0 in Accession #:    [id number]          Weight:       101.4 lb Date of Birth:  Jan 07, 1929           BSA:          1.415 m Patient Age:    87 years            BP:           152/77 mmHg Patient Gender: F                   HR:           41 bpm. Exam Location:  [facility] Procedure: Limited Echo, Limited Color Doppler, Cardiac Doppler and Saline            Contrast Bubble Study Indications:     G45.9 TIA  History:         Patient has prior history of Echocardiogram examinations, most                  recent 02/29/2020. Arrythmias:2nd degree heart block; Risk                  Factors:Hypertension.  Sonographer:     Humphrey Rolls Referring Phys:  [id number] JAN A MANSY Diagnosing Phys: Lorine Bears MD IMPRESSIONS  1. Left ventricular ejection fraction, by estimation, is 60 to 65%. The left ventricle has normal function. The left ventricle has no regional wall motion abnormalities.  2. Right ventricular systolic function is normal. The right ventricular size is normal. There is mildly elevated pulmonary artery systolic pressure.  3. Left atrial size was mildly dilated.  4. Right atrial size was mildly dilated.  5. A small pericardial effusion is present. The pericardial effusion is posterior to the left ventricle.  6. The mitral valve is normal in structure. Mild mitral valve regurgitation. No evidence of mitral stenosis.  7. Tricuspid valve regurgitation is mild to moderate.  8. The aortic valve is normal in structure. Aortic valve regurgitation is mild. Aortic valve sclerosis/calcification is present, without any evidence of aortic stenosis.  9. The inferior vena cava is normal in size with greater than 50% respiratory variability, suggesting right atrial pressure of 3 mmHg. 10. Agitated saline contrast bubble study was negative, with no evidence of any interatrial shunt. FINDINGS  Left Ventricle: Left ventricular ejection fraction, by estimation, is 60 to 65%. The left ventricle has normal function. The left ventricle has no regional wall motion abnormalities. The left ventricular internal cavity size was normal in size. There is  no left ventricular hypertrophy. Right Ventricle: The right ventricular size is normal. No increase in right ventricular wall thickness. Right ventricular systolic function is normal. There is mildly elevated pulmonary artery systolic pressure. The tricuspid regurgitant velocity is 2.81  m/s, and with an assumed right atrial pressure of 5 mmHg, the estimated right ventricular systolic pressure is 36.6 mmHg. Left Atrium: Left atrial size was mildly dilated. Right Atrium: Right atrial size was mildly dilated. Pericardium: A small pericardial effusion is present. The pericardial effusion is posterior to the left ventricle. Mitral Valve: The mitral valve is normal in structure. There is mild thickening of the mitral valve leaflet(s). Mild mitral valve regurgitation. No evidence of mitral valve stenosis. Tricuspid Valve: The tricuspid valve is normal in structure. Tricuspid valve regurgitation is mild to moderate. No evidence of tricuspid stenosis. Aortic Valve: The aortic valve is normal in structure. Aortic valve regurgitation is mild. Aortic valve sclerosis/calcification is present, without any evidence of aortic stenosis. Pulmonic Valve: The pulmonic valve was normal in structure. Pulmonic valve regurgitation is not visualized. No evidence of pulmonic stenosis. Aorta: The aortic root is normal in size and structure. Venous: The inferior vena cava is normal in size with greater than 50% respiratory variability, suggesting right atrial pressure of 3 mmHg. IAS/Shunts: No atrial level shunt detected by color flow Doppler. Agitated saline contrast was given intravenously to evaluate for intracardiac shunting. Agitated saline contrast bubble study was negative, with no  evidence of any interatrial shunt. LEFT VENTRICLE PLAX 2D LVIDd:         3.43 cm LVIDs:         2.19 cm LV PW:         0.97 cm LV IVS:        0.77 cm  LEFT ATRIUM         Index LA diam:    2.20 cm 1.55 cm/m  TRICUSPID VALVE TR Peak grad:   31.6 mmHg TR Vmax:        281.00 cm/s Lorine Bears MD Electronically signed by Lorine Bears MD Signature Date/Time: 01/07/2021/2:31:52 PM    Final     Cardiac Studies   See echo and carotid Dopplers above.  Patient Profile     87 y.o. female with history of first and second-degree AV block, hypertension, and glaucoma, admitted with slurred speech and altered mental status without loss of consciousness.  Assessment & Plan    Altered mental status and slurred speech: Initially, there was concern for TIA though neurology work-up has been largely unrevealing.  Ashby Dawes of symptoms and is not clearly related to episodic pauses, which has been a longstanding issue and has been without associated symptoms during this hospitalization.  No indication for pacing at this time, though Ms. Dicenso is certainly at risk for progression of her underlying conduction disease. -Discharged with ZIO AT 14-day event monitor.  If longer pauses are identified, particularly if they correspond with symptoms, EP consultation will need to be obtained. -Avoid AV nodal blocking agents. -Continue aspirin and statin therapy.  Hypertension: Blood pressure labile yesterday but more consistent and normal today. -Continue home BP regimen.  [facility] will sign off.   Medication Recommendations:  Continue home medications Other recommendations (labs, testing, etc):  14 ZIO AT at  discharge. Follow up as an outpatient:  Return to clinic to see me or APP in ~1 month.  For questions or updates, please contact [facility] Please consult [email or web address] for contact info under [facility].    Signed, Yvonne Kendall, MD  01/08/2021, 3:08 PM

## 2021-01-08 NOTE — TOC Transition Note (Signed)
Transition of Care [facility]) - CM/SW Discharge Note   Patient Details  Name: Cassandra Patterson MRN: [id number] Date of Birth: [birth year]  Transition of [facility]) CM/SW Contact:  Margarito Liner, LCSW Phone Number: 01/08/2021, 3:57 PM   Clinical Narrative:  Patient has orders to discharge home today. [facility] is able to accept for home health PT and OT. They cannot start until next week. They are aware and agreeable. No further concerns. CSW signing off.   Final next level of care: Home w Home Health Services Barriers to Discharge: No Barriers Identified   Patient Goals and CMS Choice   CMS Medicare.gov Compare Post Acute Care list provided to:: Patient (Sister at bedside) Choice offered to / list presented to : Patient, Sibling  Discharge Placement                Patient to be transferred to facility by: Sister will take her home Name of family member notified: Micheline Chapman Patient and family notified of of transfer: 01/08/21  Discharge Plan and Services     Post Acute Care Choice: Home Health                    HH Arranged: PT, OT [facility] Agency: [facility] Date [facility] Agency Contacted: 01/08/21   Representative spoke with at [facility] Agency: Werner Lean  Social Determinants of Health (SDOH) Interventions     Readmission Risk Interventions No flowsheet data found.

## 2021-01-08 NOTE — Progress Notes (Signed)
[facility]  - [place] at [facility]   PATIENT NAME: Cassandra Patterson    MR#:  [id number]  DATE OF BIRTH:  [birth year]  SUBJECTIVE:   came in after she had an episode of aphasia and passing out at home. Patient is alert oriented. Sister at bedside. Denies any dizziness.No falls. Any chest pain.  HR 79--115 this am on telemonitor  REVIEW OF SYSTEMS:   Review of Systems  Constitutional:  Negative for chills, fever and weight loss.  HENT:  Negative for ear discharge, ear pain and nosebleeds.   Eyes:  Negative for blurred vision, pain and discharge.  Respiratory:  Negative for sputum production, shortness of breath, wheezing and stridor.   Cardiovascular:  Negative for chest pain, palpitations, orthopnea and PND.  Gastrointestinal:  Negative for abdominal pain, diarrhea, nausea and vomiting.  Genitourinary:  Negative for frequency and urgency.  Musculoskeletal:  Negative for back pain and joint pain.  Neurological:  Positive for weakness. Negative for sensory change, speech change and focal weakness.  Psychiatric/Behavioral:  Negative for depression and hallucinations. The patient is not nervous/anxious.   Tolerating Diet: yes Tolerating PT: HHPT  DRUG ALLERGIES:  No Known Allergies  VITALS:  Blood pressure 136/79, pulse (!) 53, temperature 97.7 F (36.5 C), temperature source Oral, resp. rate 18, height 5\' 1"  (1.549 m), weight 46 kg, SpO2 100 %.  PHYSICAL EXAMINATION:   Physical Exam  GENERAL:  87 y.o.-year-old patient lying in the bed with no acute distress.  HEENT: Head atraumatic, normocephalic. Oropharynx and nasopharynx clear.  LUNGS: Normal breath sounds bilaterally, no wheezing, rales, rhonchi. No use of accessory muscles of respiration.  CARDIOVASCULAR: S1, S2 normal. No murmurs, rubs, or gallops.  ABDOMEN: Soft, nontender, nondistended. Bowel sounds present. No organomegaly or mass.  EXTREMITIES: No cyanosis, clubbing or edema b/l.    NEUROLOGIC:  nonfocal PSYCHIATRIC:  patient is alert and oriented x 2.  SKIN: No obvious rash, lesion, or ulcer.   LABORATORY PANEL:  CBC Recent Labs  Lab 01/06/21 2029  WBC 5.1  HGB 12.7  HCT 41.2  PLT 185     Chemistries  Recent Labs  Lab 01/06/21 2029 01/07/21 0614  NA 138  --   K 4.1  --   CL 106  --   CO2 28  --   GLUCOSE 112*  --   BUN 36*  --   CREATININE 0.79  --   CALCIUM 8.9  --   MG  --  1.8  AST 19  --   ALT 17  --   ALKPHOS 48  --   BILITOT 0.5  --     Cardiac Enzymes No results for input(s): TROPONINI in the last 168 hours. RADIOLOGY:  DG Chest 2 View  Result Date: 01/06/2021 CLINICAL DATA:  Weakness and slurred speech. EXAM: CHEST - 2 VIEW COMPARISON:  None. FINDINGS: The heart is moderately enlarged. The central vessels are normal caliber. There is no evidence of edema or focal pulmonary infiltrate, no pleural effusion is seen. The aorta is tortuous, ectatic, and with patchy calcifications. There is osteopenia and mild thoracic kyphosis. IMPRESSION: No evidence of acute chest disease. Cardiomegaly. Aortic atherosclerosis. Electronically Signed   By: 01/08/2021 M.D.   On: 01/06/2021 20:46   CT Head Wo Contrast  Result Date: 01/06/2021 CLINICAL DATA:  Neuro deficit with stroke suspected. Patient indicates transient slurred speech which resolved on arrival of EMS. EXAM: CT HEAD WITHOUT CONTRAST TECHNIQUE: Contiguous axial images were obtained from the  base of the skull through the vertex without intravenous contrast. COMPARISON:  CT 04/06/2017 FINDINGS: Brain: There is moderately advanced cerebral atrophy with atrophic ventriculomegaly and mild small vessel disease of the cerebral white matter, with mild cerebellar atrophy. Findings were noted previously. There is no midline shift no focal asymmetry worrisome for acute infarct, hemorrhage or mass. There are dural calcifications scattered along the falx and tiny chronic bilateral gangliocapsular lacunar infarctions  also seen previously. Vascular: There patchy calcifications of the carotid siphons but no hyperdense central vasculature. There is trace air in the right cavernous sinus, small branching lucencies in the left temporal scalp consistent with additional intravenous air, likely iatrogenic. Skull: Osteopenia. No depressed skull fracture or focal skull lesion. Sinuses/Orbits: No acute finding.  Old lens extractions. Other: None. IMPRESSION: 1. No evidence of cortical based infarct or hemorrhage , no hyperdense central vasculature. 2. Trace air in the right cavernous sinus and additional intravenous air in the left temporal scalp. This likely is iatrogenic. No evidence of skull base fracture is seen. No air is seen in the cortical veins of either hemisphere. 3. Chronic change. Electronically Signed   By: Almira Bar M.D.   On: 01/06/2021 20:55   MR BRAIN WO CONTRAST  Result Date: 01/07/2021 CLINICAL DATA:  Acute neurologic deficit EXAM: MRI HEAD WITHOUT CONTRAST TECHNIQUE: Multiplanar, multiecho pulse sequences of the brain and surrounding structures were obtained without intravenous contrast. COMPARISON:  None. FINDINGS: Brain: No acute infarct, mass effect or extra-axial collection. No acute or chronic hemorrhage. There is multifocal hyperintense T2-weighted signal within the white matter. Generalized volume loss without a clear lobar predilection. Old right cerebellar small vessel infarct. The midline structures are normal. Vascular: Major flow voids are preserved. Skull and upper cervical spine: Normal calvarium and skull base. Visualized upper cervical spine and soft tissues are normal. Sinuses/Orbits:No paranasal sinus fluid levels or advanced mucosal thickening. No mastoid or middle ear effusion. Normal orbits. IMPRESSION: 1. No acute intracranial abnormality. 2. Findings of chronic ischemic microangiopathy and generalized volume loss. Electronically Signed   By: Deatra Robinson M.D.   On: 01/07/2021 01:59    US Carotid Bilateral (at [facility] and [facility] only)  Result Date: 01/07/2021 CLINICAL DATA:  TIA symptoms, hypertension, stroke EXAM: BILATERAL CAROTID DUPLEX ULTRASOUND TECHNIQUE: Wallace Cullens scale imaging, color Doppler and duplex ultrasound were performed of bilateral carotid and vertebral arteries in the neck. COMPARISON:  None. FINDINGS: Criteria: Quantification of carotid stenosis is based on velocity parameters that correlate the residual internal carotid diameter with NASCET-based stenosis levels, using the diameter of the distal internal carotid lumen as the denominator for stenosis measurement. The following velocity measurements were obtained: RIGHT ICA: 67/17 cm/sec CCA: 34 cm/sec SYSTOLIC ICA/CCA RATIO:  2.0 ECA: 43 cm/sec LEFT ICA: 63/16 cm/sec CCA: 39 cm/sec SYSTOLIC ICA/CCA RATIO:  1.6 ECA: 49 cm/sec RIGHT CAROTID ARTERY: Minor echogenic shadowing plaque formation. No hemodynamically significant right ICA stenosis, velocity elevation, or turbulent flow. Degree of narrowing less than 50%. RIGHT VERTEBRAL ARTERY:  Normal antegrade flow LEFT CAROTID ARTERY: Similar scattered minor echogenic plaque formation. No hemodynamically significant left ICA stenosis, velocity elevation, or turbulent flow. LEFT VERTEBRAL ARTERY:  Normal antegrade flow IMPRESSION: Minor carotid atherosclerosis. Negative for stenosis. Degree of narrowing less than 50% bilaterally by ultrasound criteria. Patent antegrade vertebral flow bilaterally Electronically Signed   By: Judie Petit.  Shick M.D.   On: 01/07/2021 08:35   ECHOCARDIOGRAM LIMITED BUBBLE STUDY  Result Date: 01/07/2021    ECHOCARDIOGRAM LIMITED REPORT   Patient Name:  Cassandra Patterson Date of Exam: 01/07/2021 Medical Rec #:  [id number]           Height:       61.0 in Accession #:    [id number]          Weight:       101.4 lb Date of Birth:  13-Mar-1928           BSA:          1.415 m Patient Age:    89 years            BP:           152/77 mmHg Patient Gender: F                    HR:           41 bpm. Exam Location:  [facility] Procedure: Limited Echo, Limited Color Doppler, Cardiac Doppler and Saline            Contrast Bubble Study Indications:     G45.9 TIA  History:         Patient has prior history of Echocardiogram examinations, most                  recent 02/29/2020. Arrythmias:2nd degree heart block; Risk                  Factors:Hypertension.  Sonographer:     Humphrey Rolls Referring Phys:  [id number] JAN A MANSY Diagnosing Phys: Lorine Bears MD IMPRESSIONS  1. Left ventricular ejection fraction, by estimation, is 60 to 65%. The left ventricle has normal function. The left ventricle has no regional wall motion abnormalities.  2. Right ventricular systolic function is normal. The right ventricular size is normal. There is mildly elevated pulmonary artery systolic pressure.  3. Left atrial size was mildly dilated.  4. Right atrial size was mildly dilated.  5. A small pericardial effusion is present. The pericardial effusion is posterior to the left ventricle.  6. The mitral valve is normal in structure. Mild mitral valve regurgitation. No evidence of mitral stenosis.  7. Tricuspid valve regurgitation is mild to moderate.  8. The aortic valve is normal in structure. Aortic valve regurgitation is mild. Aortic valve sclerosis/calcification is present, without any evidence of aortic stenosis.  9. The inferior vena cava is normal in size with greater than 50% respiratory variability, suggesting right atrial pressure of 3 mmHg. 10. Agitated saline contrast bubble study was negative, with no evidence of any interatrial shunt. FINDINGS  Left Ventricle: Left ventricular ejection fraction, by estimation, is 60 to 65%. The left ventricle has normal function. The left ventricle has no regional wall motion abnormalities. The left ventricular internal cavity size was normal in size. There is  no left ventricular hypertrophy. Right Ventricle: The right ventricular size is normal. No increase in right  ventricular wall thickness. Right ventricular systolic function is normal. There is mildly elevated pulmonary artery systolic pressure. The tricuspid regurgitant velocity is 2.81  m/s, and with an assumed right atrial pressure of 5 mmHg, the estimated right ventricular systolic pressure is 36.6 mmHg. Left Atrium: Left atrial size was mildly dilated. Right Atrium: Right atrial size was mildly dilated. Pericardium: A small pericardial effusion is present. The pericardial effusion is posterior to the left ventricle. Mitral Valve: The mitral valve is normal in structure. There is mild thickening of the mitral valve leaflet(s). Mild mitral valve regurgitation. No evidence of mitral valve stenosis. Tricuspid  Valve: The tricuspid valve is normal in structure. Tricuspid valve regurgitation is mild to moderate. No evidence of tricuspid stenosis. Aortic Valve: The aortic valve is normal in structure. Aortic valve regurgitation is mild. Aortic valve sclerosis/calcification is present, without any evidence of aortic stenosis. Pulmonic Valve: The pulmonic valve was normal in structure. Pulmonic valve regurgitation is not visualized. No evidence of pulmonic stenosis. Aorta: The aortic root is normal in size and structure. Venous: The inferior vena cava is normal in size with greater than 50% respiratory variability, suggesting right atrial pressure of 3 mmHg. IAS/Shunts: No atrial level shunt detected by color flow Doppler. Agitated saline contrast was given intravenously to evaluate for intracardiac shunting. Agitated saline contrast bubble study was negative, with no evidence of any interatrial shunt. LEFT VENTRICLE PLAX 2D LVIDd:         3.43 cm LVIDs:         2.19 cm LV PW:         0.97 cm LV IVS:        0.77 cm  LEFT ATRIUM         Index LA diam:    2.20 cm 1.55 cm/m  TRICUSPID VALVE TR Peak grad:   31.6 mmHg TR Vmax:        281.00 cm/s Lorine Bears MD Electronically signed by Lorine Bears MD Signature Date/Time:  01/07/2021/2:31:52 PM    Final    ASSESSMENT AND PLAN:  Cassandra Patterson is a 87 y.o. slime African-American female with medical history significant for essential hypertension and glaucoma, who presented to the ER with acute onset of right facial droop with drooling associated expressive dysphasia and dysarthria, noted by her sister earlier this evening  altered level of consciousness/slurred speech -- patient presented with working diagnosis of TIA -- she was seen and evaluated by neurology. Workup for TIA essentially unremarkable -- MRI brain negative for CVA -- ultrasound Doppler showed minor carotid atherosclerosis. Negative for stenosis -- echo bubble study showedLVEF 60-65%, no WMA, normal RV function, mildly elevated pulmonary artery pressure, mildly dilated L&R atrium, mild MR, mild to mod TR, negative bubble study - EKG showed SB 48bpm, PRI , LAD -- patient has first-degree AV block heart rate in the 40s with pauses intermittently -- seen by neurology. Recommends aspirin  history of bradycardia-- chronic history of first and second-degree AV block ?Tachy-brady syndrome -- she was seen by Dr. Okey Dupre in 2021. Holter monitor was placed on her at home -- pacemaker was discussed. However patient declined at that time -- cardiology consultation with Dr. Kirke Corin. -- patient is not on any rate blocking agent -- TSH 0.9 --11/22--pt's HR now 70--115. Await [facility] cards rec  Hypertension -- hold hydrochlorothiazide -- on plendil  Glaucoma -- continue her home eyedrops   Procedures: Family communication : sister at bedside Consults : cardiology CODE STATUS: full DVT Prophylaxis : Lovenox Level of care: Telemetry Medical Status is: Observation  The patient remains OBS appropriate and will d/c before 2 midnights.       TOTAL TIME TAKING CARE OF THIS PATIENT: 25 minutes.  >50% time spent on counselling and coordination of care  Note: This dictation was prepared with Dragon  dictation along with smaller phrase technology. Any transcriptional errors that result from this process are unintentional.  Enedina Finner M.D    [facility]   CC: Primary care physician; Center, [facility] Patient ID: Cassandra Patterson, female   DOB: Oct 22, 1928, 87 y.o.   MRN: [id number]

## 2021-01-09 ENCOUNTER — Telehealth: Payer: Self-pay | Admitting: Internal Medicine

## 2021-01-09 DIAGNOSIS — R001 Bradycardia, unspecified: Secondary | ICD-10-CM | POA: Diagnosis not present

## 2021-01-09 NOTE — Telephone Encounter (Signed)
2nd attempt to reach the patient at her home #: - no answer/ no voice mail after multiple rings  No DPR on file, but I also tried to reach out to her sister, [name] Actor) at [phone]. No answer & no voice mail (message received after multiple rings that the call could not be completed at this time).

## 2021-01-09 NOTE — Telephone Encounter (Addendum)
Added to waitlist . Will fu next week

## 2021-01-09 NOTE — Telephone Encounter (Signed)
Reviewed the patient's chart.  She was admitted on 01/07/21 with hypotension/ slurred speech/ lightheadedness/ possible syncope.  The patient was consulted by Dr. Jesusita Oka, [place] on 01/07/21. Per Dr. Tyrell Antonio consult note: The patient was has previously been followed by Dr. Saunders Revel with an outpatient monitor done December 2021>> showed paused up to 4.1 seconds with multiple episodes of second degree heart block.  Recommendations: Bradycardia with intermittent Mobitz 2 second-degree AV block and evidence of sinus node dysfunction.  In spite of the presence of this arrhythmia, it is hard to correlate with symptoms as the patient denies syncope or presyncope.  The reported neurologic symptoms are unlikely to be related to this.  Continue monitoring overnight.  Recommend 2-week outpatient monitor.  Pulled up the patient's tracing in [employer] with MD notification: Patient had a min HR of 36 bpm, max HR of 47 bpm, and avg HR of 41 bpm. Sinus was present with a min HR of 36 bpm, max HR of 47 bpm, and avg HR of 41 bpm. 2 Pauses occurred due to Second Degree AV Block - Mobitz I (Wenckebach), the longest lasting 3.3 secs. MD notification criteria for Pauses met- multiple notification attempts made on 01/08/2021 at 10:40 pm, 01/09/2021 at 12:20 am and 01:02 am ET [facility]).  Time of event occurred 01/08/21 at 2002.  Attempted to call the patient to follow up. No answer- no voice mail after multiple rings.   Will review with DOD/ office APP for further recommendations.

## 2021-01-09 NOTE — Telephone Encounter (Signed)
Hospital consultation, ECGs, and vital signs reviewed in detail.  Patient recent admitted with neurologic symptoms and incidentally noted to have sinus bradycardia with first-degree AV block and intermittent 2-1 heart block.  Symptoms did not correlate with persistent arrhythmias.  Zio AT placed and our office was notified this morning regarding bradycardia with rates in the 30s to 40s, and intermittent 2-1 AV block.  No triggered events.  Findings similar to what was reported in the hospital, at which time patient was normotensive/hypertensive.  It's possible the patient was sleeping during findings last night.  Recommend reaching out to patient to assess for symptoms and arrange for outpatient electrophysiology follow-up.

## 2021-01-09 NOTE — Telephone Encounter (Signed)
  1. Is this related to a heart monitor you are wearing?  (If the patient says no, please ask     if they are caling about ICD/pacemaker.) YES live zio   2. What is your issue?? Per irhythm calling to report abnormal reading for pauses and unable to get a response from on call during the night    (If the patient is calling for results of the heart monitor this     message should be sent to nurse.)   Please route to covering RN/CMA/RMA for results. Route to monitor technicians or your monitor tech representative for your site for any technical concerns

## 2021-01-14 NOTE — Telephone Encounter (Signed)
Noted- the patient is scheduled to follow up with Dr. Okey Dupre on 01/17/21.

## 2021-01-14 NOTE — Telephone Encounter (Signed)
Attempted to schedule care giver declined and stated an appt has already been made.  Will schedule via referral at checkout.  Closing encounter.

## 2021-01-17 ENCOUNTER — Other Ambulatory Visit: Payer: Self-pay

## 2021-01-17 ENCOUNTER — Encounter: Payer: Self-pay | Admitting: Internal Medicine

## 2021-01-17 ENCOUNTER — Ambulatory Visit: Payer: PPO | Admitting: Internal Medicine

## 2021-01-17 VITALS — BP 140/74 | HR 67 | Ht 63.0 in | Wt 101.0 lb

## 2021-01-17 DIAGNOSIS — I440 Atrioventricular block, first degree: Secondary | ICD-10-CM | POA: Diagnosis not present

## 2021-01-17 DIAGNOSIS — I10 Essential (primary) hypertension: Secondary | ICD-10-CM | POA: Diagnosis not present

## 2021-01-17 DIAGNOSIS — I441 Atrioventricular block, second degree: Secondary | ICD-10-CM

## 2021-01-17 NOTE — Progress Notes (Signed)
Follow-up Outpatient Visit Date: 01/17/2021  Primary Care Provider: Center, [facility] [address] [place] [address]  Chief Complaint: Follow-up heart block and TIA  HPI:  Cassandra Patterson is a 87 y.o. female with history of incidentally noted second-degree AV block during work-up of palpitations, hypertension, and glaucoma, who presents for follow-up of bradycardia.  I last saw her in April, at which time she was feeling well other than some daytime somnolence.  She was hospitalized on 01/06/2021 with slurred speech, drooling, and aphasia.  MRI brain showed no evidence of acute stroke.  She was noted to have intermittent pauses of up to 3 seconds in the setting of second-degree AV block (most episodes consistent with Mobitz type I though a few Mobitz type II episodes could not be excluded).  It was not felt that her neurologic symptoms were explained by her transient bradycardia.  She was discharged with 14-day event monitor.  Single alert was registered on 01/08/2021 with nonconducted P waves and pauses of up to 3.3 seconds.  Today, Cassandra Patterson reports that she has been feeling well.  She is not having any chest pain, shortness of breath, or palpitations.  She sometimes notices transient orthostatic lightheadedness when she gets up too quickly but otherwise has not been dizzy or lightheaded.  She has not fallen or passed out.  She notes occasional leg edema, especially when her legs are dependent.  Her daughter notes this has been better since Cassandra Patterson has been walking more with physical therapy.  --------------------------------------------------------------------------------------------------  Cardiovascular History & Procedures: Cardiovascular Problems: 1st and 2nd degree AV block   Risk Factors: Hypertension and age greater than 76   Cath/PCI: None   CV Surgery: None   EP Procedures and Devices: 14-day event monitor (01/25/2020): Sinus rhythm with multiple  episodes of second-degree AV block (predominantly Mobitz type I though a few episodes of Mobitz type II cannot be excluded). Longest RR interval was 4.1 seconds. Rare PACs and PVCs also noted.  Recent CV Pertinent Labs: Lab Results  Component Value Date   CHOL 156 01/07/2021   HDL 67 01/07/2021   LDLCALC 83 01/07/2021   TRIG 30 01/07/2021   CHOLHDL 2.3 01/07/2021   K 4.1 01/06/2021   MG 1.8 01/07/2021   BUN 36 (H) 01/06/2021   CREATININE 0.79 01/06/2021    Past medical and surgical history were reviewed and updated in EPIC.  Current Meds  Medication Sig   acetaminophen (TYLENOL) 650 MG CR tablet Take 650 mg by mouth every 8 (eight) hours as needed for pain.   ammonium lactate (AMLACTIN) 12 % cream Apply topically as needed.   aspirin EC 81 MG EC tablet Take 1 tablet (81 mg total) by mouth daily. Swallow whole.   brimonidine (ALPHAGAN) 0.15 % ophthalmic solution Place 1 drop into both eyes 2 (two) times daily.   COD LIVER OIL PO Take by mouth as needed.   felodipine (PLENDIL) 5 MG 24 hr tablet TAKE 1 TABLET(5 MG) BY MOUTH DAILY   Multiple Vitamins-Minerals (ONE-A-DAY WOMENS PO) Take 1 tablet by mouth daily.   RHOPRESSA 0.02 % SOLN Place 1 drop into the right eye at bedtime.   Travoprost, BAK Free, (TRAVATAN) 0.004 % SOLN ophthalmic solution INSTILL 1 DROP BOTH EYES AT NIGHTTIME   Vitamin D, Cholecalciferol, 25 MCG (1000 UT) CAPS Take 1 capsule by mouth daily.    Allergies: Patient has no known allergies.  Social History   Tobacco Use   Smoking status: Never  Smokeless tobacco: Never  Vaping Use   Vaping Use: Never used  Substance Use Topics   Alcohol use: No   Drug use: No    Family History  Problem Relation Age of Onset   Hypertension Mother    Heart disease Neg Hx     Review of Systems: A 12-system review of systems was performed and was negative except as noted in the  HPI.  --------------------------------------------------------------------------------------------------  Physical Exam: BP 140/74 (BP Location: Right Arm, Patient Position: Sitting, Cuff Size: Normal)   Pulse 67   Ht 5\' 3"  (1.6 m)   Wt 101 lb (45.8 kg)   BMI 17.89 kg/m   General:  NAD. Neck: No JVD or HJR. Lungs: Clear to auscultation bilaterally without wheezes or crackles. Heart: Regular rate and rhythm without murmurs, rubs, or gallops. Abdomen: Soft, nontender, nondistended. Extremities: No lower extremity edema.  EKG: Normal sinus rhythm with long first-degree AV block, LAFB, and septal infarct.  Lab Results  Component Value Date   WBC 5.1 01/06/2021   HGB 12.7 01/06/2021   HCT 41.2 01/06/2021   MCV 80.6 01/06/2021   PLT 185 01/06/2021    Lab Results  Component Value Date   NA 138 01/06/2021   K 4.1 01/06/2021   CL 106 01/06/2021   CO2 28 01/06/2021   BUN 36 (H) 01/06/2021   CREATININE 0.79 01/06/2021   GLUCOSE 112 (H) 01/06/2021   ALT 17 01/06/2021    Lab Results  Component Value Date   CHOL 156 01/07/2021   HDL 67 01/07/2021   LDLCALC 83 01/07/2021   TRIG 30 01/07/2021   CHOLHDL 2.3 01/07/2021    --------------------------------------------------------------------------------------------------  ASSESSMENT AND PLAN: First-degree and Mobitz type I second-degree AV block: Cassandra Patterson continues to have a long first-degree AV block.  She continues to wear event monitor placed during recent hospitalization.  I have reviewed alert from 01/08/2021, which demonstrates a pause of up to 3.3 seconds in the setting of Mobitz type I second-degree AV block.  She was asymptomatic.  I think it is reasonable to continue ambulatory monitoring until a 14-day period has been completed.  No indication for referral to EP at this time unless high-grade AV block is identified or the patient begins having symptoms.  We will continue to avoid AV nodal blocking  agents.  Hypertension: Blood pressure upper normal today.  In the setting of bradycardia, I think it is best to tolerate upper normal to mildly elevated blood pressures.  Continue current dose of felodipine.  Follow-up: Return to clinic in 3 months.  01/10/2021, MD 01/17/2021 1:59 PM

## 2021-01-17 NOTE — Patient Instructions (Signed)
Medication Instructions:   Your physician recommends that you continue on your current medications as directed. Please refer to the Current Medication list given to you today.  *If you need a refill on your cardiac medications before your next appointment, please call your pharmacy*   Lab Work:  None ordered  Testing/Procedures:  None ordered   Follow-Up: At [facility], you and your health needs are our priority.  As part of our continuing mission to provide you with exceptional heart care, we have created designated Provider Care Teams.  These Care Teams include your primary Cardiologist (physician) and Advanced Practice Providers (APPs -  Physician Assistants and Nurse Practitioners) who all work together to provide you with the care you need, when you need it.  We recommend signing up for the patient portal called "MyChart".  Sign up information is provided on this After Visit Summary.  MyChart is used to connect with patients for Virtual Visits (Telemedicine).  Patients are able to view lab/test results, encounter notes, upcoming appointments, etc.  Non-urgent messages can be sent to your provider as well.   To learn more about what you can do with MyChart, go to [email or web address]://[email or web address].    Your next appointment:   3 month(s)  The format for your next appointment:   In Person  Provider:   You may see Christopher End, MD or one of the following Advanced Practice Providers on your designated Care Team:   Christopher Berge, NP Ryan Dunn, PA-C Cadence Furth, PA-C 

## 2021-01-18 DIAGNOSIS — G459 Transient cerebral ischemic attack, unspecified: Secondary | ICD-10-CM | POA: Diagnosis not present

## 2021-01-18 DIAGNOSIS — Z0000 Encounter for general adult medical examination without abnormal findings: Secondary | ICD-10-CM | POA: Diagnosis not present

## 2021-01-18 DIAGNOSIS — Z8679 Personal history of other diseases of the circulatory system: Secondary | ICD-10-CM | POA: Diagnosis not present

## 2021-01-18 DIAGNOSIS — Z0130 Encounter for examination of blood pressure without abnormal findings: Secondary | ICD-10-CM | POA: Diagnosis not present

## 2021-01-18 DIAGNOSIS — I10 Essential (primary) hypertension: Secondary | ICD-10-CM | POA: Diagnosis not present

## 2021-01-19 DIAGNOSIS — I10 Essential (primary) hypertension: Secondary | ICD-10-CM | POA: Diagnosis not present

## 2021-01-19 DIAGNOSIS — R001 Bradycardia, unspecified: Secondary | ICD-10-CM | POA: Diagnosis not present

## 2021-01-19 DIAGNOSIS — G459 Transient cerebral ischemic attack, unspecified: Secondary | ICD-10-CM | POA: Diagnosis not present

## 2021-01-29 NOTE — Addendum Note (Signed)
Encounter addended by: Oneida Arenas on: 01/29/2021 9:35 AM  Actions taken: Imaging Exam ended

## 2021-01-30 ENCOUNTER — Telehealth: Payer: Self-pay | Admitting: *Deleted

## 2021-01-30 DIAGNOSIS — I442 Atrioventricular block, complete: Secondary | ICD-10-CM

## 2021-01-30 DIAGNOSIS — I441 Atrioventricular block, second degree: Secondary | ICD-10-CM

## 2021-01-30 NOTE — Telephone Encounter (Signed)
End, Cristal Deer, MD  Annia Belt, RN Cc: Fransico Michael, Cadence H, PA-C Hi Radiance Deady,   Could you refer Ms. Jarvie to EP for evaluation of high-grade AV block?  Recent monitor showed mostly Mobitz type I second-degree AV block though she had some episodes of transient complete heart block and pauses of up to 3.6 seconds.  Thanks.   Chris    Referral placed. Forwarding to scheduling.

## 2021-01-31 NOTE — Telephone Encounter (Signed)
Spoke with patient - will need to wait on sister to help schedule

## 2021-05-01 ENCOUNTER — Other Ambulatory Visit: Payer: Self-pay

## 2021-05-01 ENCOUNTER — Encounter: Payer: Self-pay | Admitting: Internal Medicine

## 2021-05-01 ENCOUNTER — Ambulatory Visit: Payer: PPO | Admitting: Internal Medicine

## 2021-05-01 VITALS — BP 100/60 | HR 44 | Ht 63.0 in | Wt 97.0 lb

## 2021-05-01 DIAGNOSIS — I10 Essential (primary) hypertension: Secondary | ICD-10-CM

## 2021-05-01 DIAGNOSIS — I441 Atrioventricular block, second degree: Secondary | ICD-10-CM | POA: Diagnosis not present

## 2021-05-01 DIAGNOSIS — Z8673 Personal history of transient ischemic attack (TIA), and cerebral infarction without residual deficits: Secondary | ICD-10-CM | POA: Diagnosis not present

## 2021-05-01 MED ORDER — FELODIPINE ER 5 MG PO TB24: ORAL_TABLET | ORAL | Status: DC

## 2021-05-01 NOTE — Patient Instructions (Signed)
Medication Instructions:  ? ?Your physician has recommended you make the following change in your medication:  ? ?DECREASE Felodipine 2.5 mg daily (You may cut current tablet in HALF for total of 2.5 mg daily) ? ?*If you need a refill on your cardiac medications before your next appointment, please call your pharmacy* ? ? ?Lab Work: ? ?None ordered ? ?Testing/Procedures: ? ?None ordered ? ? ?Follow-Up: ?At [facility], you and your health needs are our priority.  As part of our continuing mission to provide you with exceptional heart care, we have created designated Provider Care Teams.  These Care Teams include your primary Cardiologist (physician) and Advanced Practice Providers (APPs -  Physician Assistants and Nurse Practitioners) who all work together to provide you with the care you need, when you need it. ? ?We recommend signing up for the patient portal called "MyChart".  Sign up information is provided on this After Visit Summary.  MyChart is used to connect with patients for Virtual Visits (Telemedicine).  Patients are able to view lab/test results, encounter notes, upcoming appointments, etc.  Non-urgent messages can be sent to your provider as well.   ?To learn more about what you can do with MyChart, go to [email or web address].   ? ?Your next appointment:   ?3 month(s) ? ?The format for your next appointment:   ?In Person ? ?Provider:   ?You may see Yvonne Kendall, MD or one of the following Advanced Practice Providers on your designated Care Team:   ?Nicolasa Ducking, NP ?Eula Listen, PA-C ?Cadence Fransico Michael, PA-C ?

## 2021-05-01 NOTE — Progress Notes (Signed)
? ?Follow-up Outpatient Visit ?Date: 05/01/2021 ? ?Primary Care Provider: ?Center, [facility] ?[address] ? [place] [address] ? ?Chief Complaint: Follow-up AV block ? ?HPI:  Ms. Cella is a 88 y.o. female with history of incidentally noted second-degree AV block during work-up of palpitations with pauses of up to 4.1 seconds, TIA, hypertension, and glaucoma, who presents for follow-up of bradycardia and heart block.  I last saw her in 01/2021, at which time she was feeling well other than occasional transient orthostatic lightheadedness.  She had not had any syncope or falls.  Walking had improved with physical therapy.  We did not make any medication changes or pursue additional testing at that time.  EP referral was made, though the this appointment could never be scheduled. ? ?Today, Ms. Propp reports that she is feeling well.  Her daughter reports one "near miss" with a fall in which Ms. Marrin lost her balance.  She was walking without her walker.  She denies syncope.  She sometimes has brief dizziness when she bends over to tie her shoes or pick something up.  She denies chest pain, shortness of breath, and palpitations.  Her main concern is persistent insomnia; she frequently naps throughout the day and then has a hard time sleeping at night. ? ?-------------------------------------------------------------------------------------------------- ? ?Cardiovascular History & Procedures: ?Cardiovascular Problems: ?1st and 2nd degree AV block ?  ?Risk Factors: ?Hypertension and age greater than 38 ?  ?Cath/PCI: ?None ?  ?CV Surgery: ?None ?  ?EP Procedures and Devices: ?14-day event monitor (01/25/2020): Sinus rhythm with multiple episodes of second-degree AV block (predominantly Mobitz type I though a few episodes of Mobitz type II cannot be excluded). Longest RR interval was 4.1 seconds. Rare PACs and PVCs also noted. ? ?Recent CV Pertinent Labs: ?Lab Results  ?Component Value Date  ?  CHOL 156 01/07/2021  ? HDL 67 01/07/2021  ? LDLCALC 83 01/07/2021  ? TRIG 30 01/07/2021  ? CHOLHDL 2.3 01/07/2021  ? K 4.1 01/06/2021  ? MG 1.8 01/07/2021  ? BUN 36 (H) 01/06/2021  ? CREATININE 0.79 01/06/2021  ? ? ?Past medical and surgical history were reviewed and updated in EPIC. ? ?Current Meds  ?Medication Sig  ? acetaminophen (TYLENOL) 650 MG CR tablet Take 650 mg by mouth every 8 (eight) hours as needed for pain.  ? ammonium lactate (AMLACTIN) 12 % cream Apply topically as needed.  ? aspirin EC 81 MG EC tablet Take 1 tablet (81 mg total) by mouth daily. Swallow whole.  ? brimonidine (ALPHAGAN) 0.15 % ophthalmic solution Place 1 drop into both eyes 2 (two) times daily.  ? COD LIVER OIL PO Take by mouth as needed.  ? felodipine (PLENDIL) 5 MG 24 hr tablet TAKE 1 TABLET(5 MG) BY MOUTH DAILY  ? Latanoprostene Bunod (VYZULTA OP) Apply 1 drop to eye at bedtime. Right eye  ? Multiple Vitamins-Minerals (ONE-A-DAY WOMENS PO) Take 1 tablet by mouth daily.  ? Travoprost, BAK Free, (TRAVATAN) 0.004 % SOLN ophthalmic solution Place 1 drop into the left eye at bedtime.  ? Vitamin D, Cholecalciferol, 25 MCG (1000 UT) CAPS Take 1 capsule by mouth daily.  ? [DISCONTINUED] RHOPRESSA 0.02 % SOLN Place 1 drop into the right eye at bedtime.  ? ? ?Allergies: Patient has no known allergies. ? ?Social History  ? ?Tobacco Use  ? Smoking status: Never  ? Smokeless tobacco: Never  ?Vaping Use  ? Vaping Use: Never used  ?Substance Use Topics  ? Alcohol use: No  ?  Drug use: No  ? ? ?Family History  ?Problem Relation Age of Onset  ? Hypertension Mother   ? Heart disease Neg Hx   ? ? ?Review of Systems: ?A 12-system review of systems was performed and was negative except as noted in the HPI. ? ?-------------------------------------------------------------------------------------------------- ? ?Physical Exam: ?BP 100/60 (BP Location: Right Arm, Patient Position: Sitting, Cuff Size: Normal)   Pulse (!) 44   Ht 5\' 3"  (1.6 m)   Wt 97 lb  (44 kg)   BMI 17.18 kg/m?  ? ?General:  NAD. ?Neck: No JVD or HJR. ?Lungs: Clear to auscultation bilaterally without wheezes or crackles. ?Heart: Regular rate and rhythm with isolated pause noted on auscultation.  No murmurs or rubs.  It ?Abdomen: Soft, nontender, nondistended. ?Extremities: No lower extremity edema. ? ?EKG: Normal sinus rhythm with sinus arrhythmia and Mobitz type I second-degree AV block, left anterior fascicular block, and poor R wave progression.  Compared with prior tracing from 01/17/2021, Mobitz type I second-degree AV block has replaced first-degree AV block. ? ?Lab Results  ?Component Value Date  ? WBC 5.1 01/06/2021  ? HGB 12.7 01/06/2021  ? HCT 41.2 01/06/2021  ? MCV 80.6 01/06/2021  ? PLT 185 01/06/2021  ? ? ?Lab Results  ?Component Value Date  ? NA 138 01/06/2021  ? K 4.1 01/06/2021  ? CL 106 01/06/2021  ? CO2 28 01/06/2021  ? BUN 36 (H) 01/06/2021  ? CREATININE 0.79 01/06/2021  ? GLUCOSE 112 (H) 01/06/2021  ? ALT 17 01/06/2021  ? ? ?Lab Results  ?Component Value Date  ? CHOL 156 01/07/2021  ? HDL 67 01/07/2021  ? LDLCALC 83 01/07/2021  ? TRIG 30 01/07/2021  ? CHOLHDL 2.3 01/07/2021  ? ? ?-------------------------------------------------------------------------------------------------- ? ?ASSESSMENT AND PLAN: ?Second degree AV block: ?Ms. Grimley has minimal symptoms despite her history of first and second-degree AV block.  EKG today is most consistent with sinus rhythm with Mobitz type I second-degree AV block.  We previously referred her to EP, though appointment was never able to be scheduled.  Given that she is asymptomatic, pacemaker placement is not indicated at this time.  We will therefore defer EP consultation for now.  AV nodal blocking agents should continue to be avoided.  I advised Ms. Maskell and her daughter to contact 01/09/2021 immediately if any symptoms of high-grade AV block develop including lightheadedness, syncope, chest pain, and shortness of  breath. ? ?Hypertension: ?Blood pressure borderline low today.  In the setting of her bradycardia, I think Ms. Sellers would benefit from slightly less aggressive blood pressure control.  I have recommended decreasing felodipine to 2.5 mg daily.  Ms. [place] wishes to discuss this with her PCP, Dr. Sallyanne Kuster, as well.  I have encouraged them to reach out to Dr. Marvis Moeller and to move forward with this medication change. ? ?TIA: ?No new neurologic changes reported following TIA last fall.  Continue aspirin and statin therapy. ? ?Follow-up: Return to clinic in 3 months. ? ?Marvis Moeller, MD ?05/01/2021 ?9:56 AM ? ?

## 2021-08-20 NOTE — Progress Notes (Unsigned)
Follow-up Outpatient Visit Date: 08/21/2021  Primary Care Provider: Center, [facility] [address] [place] [address]  Chief Complaint: Follow-up heart block  HPI:  Cassandra Patterson is a 88 y.o. female with history of incidentally noted second-degree AV block during work-up of palpitations with pauses of up to 4.1 seconds, TIA, hypertension, and glaucoma, who presents for follow-up of bradycardia and heart block.  I last saw her in March, at which time she was feeling well.  Her daughter reported that Cassandra Patterson had a "near miss" with a fall due to loss of balance.  She also noted some dizziness when bending over to tie her shoes.  She denies syncope of falls.  Due to low normal BP, we agreed to cut felodipine in half to to 2.5 mg daily.  However, this was subsequently increased back to 5 mg daily by her PCP due to elevated blood pressures.  Today, Cassandra Patterson reports that she is feeling well.  She notes occasional palpitations that make her feel fatigued.  They usually pass with rest, lasting 10-15 minutes.  She denies chest pain, shortness of breath, and edema.  She still has occasional orthostatic lightheadedness but has not passed out or fallen.  --------------------------------------------------------------------------------------------------  Cardiovascular History & Procedures: Cardiovascular Problems: 1st and 2nd degree AV block   Risk Factors: Hypertension and age greater than 79   Cath/PCI: None   CV Surgery: None   EP Procedures and Devices: 14-day event monitor (01/25/2020): Sinus rhythm with multiple episodes of second-degree AV block (predominantly Mobitz type I though a few episodes of Mobitz type II cannot be excluded). Longest RR interval was 4.1 seconds. Rare PACs and PVCs also noted.  Recent CV Pertinent Labs: Lab Results  Component Value Date   CHOL 156 01/07/2021   HDL 67 01/07/2021   LDLCALC 83 01/07/2021   TRIG 30 01/07/2021   CHOLHDL  2.3 01/07/2021   K 4.1 01/06/2021   MG 1.8 01/07/2021   BUN 36 (H) 01/06/2021   CREATININE 0.79 01/06/2021    Past medical and surgical history were reviewed and updated in EPIC.  Current Meds  Medication Sig   acetaminophen (TYLENOL) 650 MG CR tablet Take 650 mg by mouth every 8 (eight) hours as needed for pain.   ammonium lactate (AMLACTIN) 12 % cream Apply topically as needed.   brimonidine (ALPHAGAN) 0.15 % ophthalmic solution Place 1 drop into both eyes 2 (two) times daily.   COD LIVER OIL PO Take by mouth as needed.   felodipine (PLENDIL) 5 MG 24 hr tablet Take 5 mg by mouth daily.   Latanoprostene Bunod (VYZULTA OP) Apply 1 drop to eye at bedtime. Right eye   LUMIGAN 0.01 % SOLN Place 1 drop into the left eye at bedtime.   Multiple Vitamins-Minerals (ONE-A-DAY WOMENS PO) Take 1 tablet by mouth daily.    Allergies: Patient has no known allergies.  Social History   Tobacco Use   Smoking status: Never   Smokeless tobacco: Never  Vaping Use   Vaping Use: Never used  Substance Use Topics   Alcohol use: No   Drug use: No    Family History  Problem Relation Age of Onset   Hypertension Mother    Heart disease Neg Hx     Review of Systems: A 12-system review of systems was performed and was negative except as noted in the HPI.  --------------------------------------------------------------------------------------------------  Physical Exam: BP (!) 160/82 (BP Location: Right Arm, Patient Position: Sitting, Cuff Size:  Normal)   Pulse 71   Ht 5\' 3"  (1.6 m)   Wt 101 lb (45.8 kg)   SpO2 95%   BMI 17.89 kg/m  Repeat BP 148/74  General:  NAD.  Accompanied by her daughter. Neck: No JVD or HJR. Lungs: Clear to auscultation bilaterally without wheezes or crackles. Heart: Regular rate and rhythm without murmurs, rubs, or gallops. Abdomen: Soft, nontender, nondistended. Extremities: No lower extremity edema.  EKG:  NSR with 1st degree AV block, LAFB, and possible  septal MI.  Compared with prior tracing from 05/01/2021, 1st degree AV block has replaced Mobitz type 1 second degree AV block.  Otherwise, no significant interval change.  Lab Results  Component Value Date   WBC 5.1 01/06/2021   HGB 12.7 01/06/2021   HCT 41.2 01/06/2021   MCV 80.6 01/06/2021   PLT 185 01/06/2021    Lab Results  Component Value Date   NA 138 01/06/2021   K 4.1 01/06/2021   CL 106 01/06/2021   CO2 28 01/06/2021   BUN 36 (H) 01/06/2021   CREATININE 0.79 01/06/2021   GLUCOSE 112 (H) 01/06/2021   ALT 17 01/06/2021    Lab Results  Component Value Date   CHOL 156 01/07/2021   HDL 67 01/07/2021   LDLCALC 83 01/07/2021   TRIG 30 01/07/2021   CHOLHDL 2.3 01/07/2021    --------------------------------------------------------------------------------------------------  ASSESSMENT AND PLAN: Mobitz type 1 second degree AV block: EKG today shows long 1st degree AV block.  No syncope or other symptoms or persistent high-grade AV block reported.  Continue close clinical follow-up and avoidance of AV-nodal blocking agents.  Palpitations: Prior event monitor without significant tachyarrhythmia.  We have agreed to defer additional workup at this time.  Hypertension: BP mildly-moderately elevated but typically better at home and with PCP.  Given occasional orthostatic lightheadedness, we will tolerate some permissive hypertension and defer medication changes today.  History of TIA: No new neurologic symptoms.  Suspect arrhythmia may have caused transient altered mental status in 2021. Patient was previously on statin and aspirin but not at this time.  Defer management to PCP.  Follow-up: Return to clinic in 6 months.  2022, MD 08/21/2021 9:44 AM

## 2021-08-21 ENCOUNTER — Encounter: Payer: Self-pay | Admitting: Internal Medicine

## 2021-08-21 ENCOUNTER — Ambulatory Visit: Payer: PPO | Admitting: Internal Medicine

## 2021-08-21 VITALS — BP 160/82 | HR 71 | Ht 63.0 in | Wt 101.0 lb

## 2021-08-21 DIAGNOSIS — I10 Essential (primary) hypertension: Secondary | ICD-10-CM

## 2021-08-21 DIAGNOSIS — Z8673 Personal history of transient ischemic attack (TIA), and cerebral infarction without residual deficits: Secondary | ICD-10-CM

## 2021-08-21 DIAGNOSIS — I441 Atrioventricular block, second degree: Secondary | ICD-10-CM

## 2021-08-21 DIAGNOSIS — R002 Palpitations: Secondary | ICD-10-CM

## 2021-08-21 NOTE — Patient Instructions (Signed)

## 2021-08-22 ENCOUNTER — Other Ambulatory Visit: Payer: Self-pay | Admitting: Internal Medicine

## 2021-08-22 NOTE — Telephone Encounter (Signed)
Please advise if OK to refill or defer to PCP. Thank you! 

## 2021-11-16 ENCOUNTER — Other Ambulatory Visit: Payer: Self-pay | Admitting: Internal Medicine

## 2021-11-21 ENCOUNTER — Telehealth: Payer: Self-pay | Admitting: Internal Medicine

## 2021-11-21 MED ORDER — FELODIPINE ER 5 MG PO TB24: ORAL_TABLET | ORAL | 1 refills | Status: DC

## 2021-11-21 NOTE — Telephone Encounter (Signed)
*  STAT* If patient is at the pharmacy, call can be transferred to refill team.   1. Which medications need to be refilled? (please list name of each medication and dose if known)  felodipine (PLENDIL) 5 MG 24 hr tablet  2. Which pharmacy/location (including street and city if local pharmacy) is medication to be sent to? [facility] [place], [place]  3. Do they need a 30 day or 90 day supply?   90 day supply  Patient is almost out of medication--patient's sister states she just contacted the pharmacy and they advised they did not receive the Rx and to contact our office.  I called the pharmacy and spoke with a rep who states they never received the Rx, although we received a confirmation receipt.  Confirmed fax # on file is correct for [facility]. They are requesting that we resend Rx.

## 2021-11-21 NOTE — Telephone Encounter (Signed)
Resent the patient's RX for felodipine 5 mg once daily to the pharmacy. # 90 w/ 1 refill.   I called the pharmacy and verified that they have received the electronic RX.  I also called the patient's sister, Vanita Ingles, and have advised her the RX has been received.   She voices understanding and is agreeable.

## 2022-03-20 ENCOUNTER — Encounter: Payer: Self-pay | Admitting: *Deleted

## 2022-03-25 ENCOUNTER — Encounter: Payer: Self-pay | Admitting: Internal Medicine

## 2022-03-25 NOTE — Progress Notes (Unsigned)
Follow-up Outpatient Visit Date: 03/26/2022  Primary Care Provider: [facility], [facility] [place] [address]  Chief Complaint: Follow-up heart block  HPI:  Cassandra Patterson is a 89 y.o. female with history of incidentally noted second-degree AV block during work-up of palpitations with pauses of up to 4.1 seconds, TIA, hypertension, and glaucoma, who presents for follow-up of bradycardia and heart block.  I last saw her in 08/2021, at which time she was feeling well other than sporadic palpitations with associated fatigue.  She reported occasional orthostatic lightheadedness but no syncope or falls.  Today, Cassandra Patterson reports that she has been feeling fairly well.  Her daughter notes that Cassandra Patterson continues to have some heart rate fluctuations, though they have not dropped below the mid to upper 50s.  She recently had a fall during which she lost her balance while trying to turn her walker.  She scraped her back but did not have any significant injuries.  She has not complained of lightheadedness nor experienced any chest pain, shortness of breath, palpitations, or edema.  Home blood pressures are typically 130-150/70-80.  --------------------------------------------------------------------------------------------------   Past Medical History:  Diagnosis Date   Cataracts, bilateral    Glaucoma    Hypertension    Mobitz type 1 second degree atrioventricular block    TIA (transient ischemic attack)    Past Surgical History:  Procedure Laterality Date   CATARACT EXTRACTION      Current Meds  Medication Sig   acetaminophen (TYLENOL) 650 MG CR tablet Take 650 mg by mouth every 8 (eight) hours as needed for pain.   ammonium lactate (AMLACTIN) 12 % cream Apply topically as needed.   aspirin EC 81 MG tablet Take 81 mg by mouth daily. Swallow whole.   brimonidine (ALPHAGAN) 0.15 % ophthalmic solution Place 1 drop into both eyes 2 (two) times daily.    Cholecalciferol (VITAMIN D-3) 25 MCG (1000 UT) CAPS Take by mouth daily.   COD LIVER OIL PO Take by mouth as needed.   felodipine (PLENDIL) 5 MG 24 hr tablet Take 1 tablet (5 mg) by mouth once daily   Latanoprostene Bunod (VYZULTA OP) Apply 1 drop to eye at bedtime. Right eye   LUMIGAN 0.01 % SOLN Place 1 drop into the left eye at bedtime.   Multiple Vitamins-Minerals (ONE-A-DAY WOMENS PO) Take 1 tablet by mouth daily.    Allergies: Patient has no known allergies.  Social History   Tobacco Use   Smoking status: Never   Smokeless tobacco: Never  Vaping Use   Vaping Use: Never used  Substance Use Topics   Alcohol use: No   Drug use: No    Family History  Problem Relation Age of Onset   Hypertension Mother    Heart disease Neg Hx     Review of Systems: A 12-system review of systems was performed and was negative except as noted in the HPI.  --------------------------------------------------------------------------------------------------  Physical Exam: BP 132/76 (BP Location: Left Arm, Patient Position: Sitting)   Pulse 78   Ht 4\' 11"  (1.499 m)   Wt 98 lb (44.5 kg)   BMI 19.79 kg/m   General:  NAD. Neck: No JVD or HJR. Lungs: Clear to auscultation bilaterally without wheezes or crackles. Heart: Regular rate and rhythm with 2/6 systolic murmur. Abdomen: Soft, nontender, nondistended. Extremities: No lower extremity edema.  EKG: Normal sinus rhythm with first-degree AV block (PR interval 294 ms), left anterior fascicular block, and poor R wave progression.  PR interval has shortened since 08/21/2021.  Otherwise, no significant interval change.  Lab Results  Component Value Date   WBC 5.1 01/06/2021   HGB 12.7 01/06/2021   HCT 41.2 01/06/2021   MCV 80.6 01/06/2021   PLT 185 01/06/2021    Lab Results  Component Value Date   NA 138 01/06/2021   K 4.1 01/06/2021   CL 106 01/06/2021   CO2 28 01/06/2021   BUN 36 (H) 01/06/2021   CREATININE 0.79 01/06/2021    GLUCOSE 112 (H) 01/06/2021   ALT 17 01/06/2021    Lab Results  Component Value Date   CHOL 156 01/07/2021   HDL 67 01/07/2021   LDLCALC 83 01/07/2021   TRIG 30 01/07/2021   CHOLHDL 2.3 01/07/2021    --------------------------------------------------------------------------------------------------  ASSESSMENT AND PLAN: Mobitz type I second-degree AV block: No symptoms to suggest high-grade AV block reported.  EKG today again shows first-degree AV block, albeit with shorter PR interval than at our last visit.  Continue to avoid AV nodal blocking agents.  No further intervention recommended at this time.  As she has not had any labs in over a year, we will check a CBC, CMP, and TSH today.  Hypertension: Blood pressure upper normal today.  Some home readings have been a little bit higher, though we will tolerate permissive hypertension in the setting of the patient's intermittent second-degree AV block and advanced age in order to minimize the risk for falls.  Check CMP and TSH today.  History of TIA: No new neurologic symptoms reported.  Suspect questionable TIA in the past may have been due to her arrhythmia.  Cassandra Patterson is no longer on aspirin or a statin.  Defer ongoing management to her PCP.  Follow-up: Return to clinic in 6 months.  Nelva Bush, MD 03/26/2022 10:12 AM

## 2022-03-26 ENCOUNTER — Encounter: Payer: Self-pay | Admitting: Internal Medicine

## 2022-03-26 ENCOUNTER — Other Ambulatory Visit
Admission: RE | Admit: 2022-03-26 | Discharge: 2022-03-26 | Disposition: A | Discharge status: Home / Self Care | Payer: PPO | Source: Ambulatory Visit | Attending: Internal Medicine | Admitting: Internal Medicine

## 2022-03-26 ENCOUNTER — Ambulatory Visit: Payer: PPO | Attending: Internal Medicine | Admitting: Internal Medicine

## 2022-03-26 VITALS — BP 132/76 | HR 78 | Ht 59.0 in | Wt 98.0 lb

## 2022-03-26 DIAGNOSIS — Z8673 Personal history of transient ischemic attack (TIA), and cerebral infarction without residual deficits: Secondary | ICD-10-CM | POA: Diagnosis not present

## 2022-03-26 DIAGNOSIS — Z79899 Other long term (current) drug therapy: Secondary | ICD-10-CM | POA: Diagnosis present

## 2022-03-26 DIAGNOSIS — I10 Essential (primary) hypertension: Secondary | ICD-10-CM | POA: Diagnosis not present

## 2022-03-26 DIAGNOSIS — I441 Atrioventricular block, second degree: Secondary | ICD-10-CM | POA: Diagnosis not present

## 2022-03-26 LAB — CBC
HCT: 41.1 % (ref 36.0–46.0)
Hemoglobin: 12.3 g/dL (ref 12.0–15.0)
MCH: 23.2 pg — ABNORMAL LOW (ref 26.0–34.0)
MCHC: 29.9 g/dL — ABNORMAL LOW (ref 30.0–36.0)
MCV: 77.5 fL — ABNORMAL LOW (ref 80.0–100.0)
Platelets: 270 10*3/uL (ref 150–400)
RBC: 5.3 MIL/uL — ABNORMAL HIGH (ref 3.87–5.11)
RDW: 15.5 % (ref 11.5–15.5)
WBC: 5.3 10*3/uL (ref 4.0–10.5)
nRBC: 0 % (ref 0.0–0.2)

## 2022-03-26 LAB — COMPREHENSIVE METABOLIC PANEL
ALT: 16 U/L (ref 0–44)
AST: 21 U/L (ref 15–41)
Albumin: 3.3 g/dL — ABNORMAL LOW (ref 3.5–5.0)
Alkaline Phosphatase: 94 U/L (ref 38–126)
Anion gap: 8 (ref 5–15)
BUN: 27 mg/dL — ABNORMAL HIGH (ref 8–23)
CO2: 25 mmol/L (ref 22–32)
Calcium: 9.1 mg/dL (ref 8.9–10.3)
Chloride: 105 mmol/L (ref 98–111)
Creatinine, Ser: 0.81 mg/dL (ref 0.44–1.00)
GFR, Estimated: >60 mL/min (ref >60)
Glucose, Bld: 117 mg/dL — ABNORMAL HIGH (ref 70–99)
Potassium: 3.8 mmol/L (ref 3.5–5.1)
Sodium: 138 mmol/L (ref 135–145)
Total Bilirubin: 0.8 mg/dL (ref 0.3–1.2)
Total Protein: 7.5 g/dL (ref 6.5–8.1)

## 2022-03-26 LAB — TSH: TSH: 1.341 u[IU]/mL (ref 0.350–4.500)

## 2022-03-26 NOTE — Patient Instructions (Signed)
Medication Instructions:  Your Physician recommend you continue on your current medication as directed.    *If you need a refill on your cardiac medications before your next appointment, please call your pharmacy*   Lab Work: Your provider would like for you to have following labs drawn: (CBC, CMP, TSH).   Please go to the [facility] entrance and check in at the front desk.  You do not need an appointment.  They are open from 7am-6 pm.   If you have labs (blood work) drawn today and your tests are completely normal, you will receive your results only by: [place] (if you have MyChart) OR A paper copy in the mail If you have any lab test that is abnormal or we need to change your treatment, we will call you to review the results.   Testing/Procedures: None ordered today   Follow-Up: At [facility], you and your health needs are our priority.  As part of our continuing mission to provide you with exceptional heart care, we have created designated Provider Care Teams.  These Care Teams include your primary Cardiologist (physician) and Advanced Practice Providers (APPs -  Physician Assistants and Nurse Practitioners) who all work together to provide you with the care you need, when you need it.  We recommend signing up for the patient portal called "MyChart".  Sign up information is provided on this After Visit Summary.  MyChart is used to connect with patients for Virtual Visits (Telemedicine).  Patients are able to view lab/test results, encounter notes, upcoming appointments, etc.  Non-urgent messages can be sent to your provider as well.   To learn more about what you can do with MyChart, go to [email or web address].    Your next appointment:   6 month(s)  Provider:   You may see Nelva Bush, MD or one of the following Advanced Practice Providers on your designated Care Team:   Murray Hodgkins, NP Christell Faith, PA-C Cadence Kathlen Mody, PA-C Gerrie Nordmann,  NP

## 2022-05-17 ENCOUNTER — Other Ambulatory Visit: Payer: Self-pay | Admitting: Internal Medicine

## 2022-08-14 ENCOUNTER — Other Ambulatory Visit: Payer: Self-pay | Admitting: Internal Medicine

## 2022-10-15 NOTE — Progress Notes (Signed)
Cardiology Office Note:  .   Date:  10/16/2022  ID:  [name], DOB [birth year], MRN [id number] PCP: [facility]   HeartCare Providers Cardiologist:  Yvonne Kendall, MD     History of Present Illness: .   Cassandra Patterson is a 89 y.o. female with incidentally noted second-degree AV block during workup with palpitations with pauses of up to 4.1 seconds, TIA, hypertension, and glaucoma, who presents for follow-up of bradycardia/heart block.  I last saw her in February, which time she was feeling fairly well.  Her daughter noted that Cassandra Patterson continued to have some heart rate fluctuations, though her resting heart rate never dropped below the mid 50s.  She had suffered a mechanical fall shortly before our visit and scraped her back but otherwise did not have any significant injuries.  We did not make any medication changes or pursue additional testing.  Today, [name] reports that she is feeling fairly well.  Most history is also provided by her daughter, who accompanies Cassandra Patterson today.  Apparently, there was an episode of hypotension and/or bradycardia a few weeks ago witnessed by other family members.  Cassandra Patterson seemed sluggish and complained of nausea.  Her daughter reports that diastolic blood pressure was extremely low, around 17.  On further questioning, she is not sure if this was actually the heart rate or her diastolic blood pressure.  Cassandra Patterson felt better over the course of an hour or 2 with subsequent normalization of her blood pressure.  No other episodes of hypotension or bradycardia have been witnessed by Cassandra Patterson family.  She also had a recent fall, tripping over her walker while trying to get out of the car.  This reportedly led to a fracture in her spine that is being managed conservatively.  She has not had any chest pain, shortness of breath, or edema.  ROS: See HPI  Studies Reviewed: Marland Kitchen   EKG  Interpretation Date/Time:  Thursday October 16 2022 09:21:00 EDT Ventricular Rate:  58 PR Interval:  272 QRS Duration:  70 QT Interval:  446 QTC Calculation: 437 R Axis:   -73  Text Interpretation: Sinus bradycardia with 1st degree A-V block Left anterior fascicular block Anteroseptal infarct , age undetermined When compared with ECG of 26-Mar-2022 No significant change was found Confirmed by Ronell Boldin, Cristal Deer [phone]) on 10/16/2022 9:30:12 AM     Risk Assessment/Calculations:         Physical Exam:   VS:  BP (!) 118/96 (BP Location: Left Arm, Patient Position: Sitting, Cuff Size: Normal)   Pulse (!) 58   Ht 5\' 3"  (1.6 m)   Wt 90 lb 9.6 oz (41.1 kg)   SpO2 97%   BMI 16.05 kg/m    Wt Readings from Last 3 Encounters:  10/16/22 90 lb 9.6 oz (41.1 kg)  03/26/22 98 lb (44.5 kg)  08/21/21 101 lb (45.8 kg)    General: Woman seated comfortably in a wheelchair.  She is accompanied by her daughter.. Neck: No JVD or HJR. Lungs: Clear to auscultation bilaterally without wheezes or crackles. Heart: Regular rate and rhythm without murmurs, rubs, or gallops. Abdomen: Soft, nontender, nondistended. Extremities: No lower extremity edema.  ASSESSMENT AND PLAN: .    Mobitz type I second-degree AV block: EKG today again shows first-degree AV block and left anterior fascicular block, similar to prior tracing.  I worry that recent episode of reported low diastolic blood pressure may have actually been due to marked bradycardia.  I recommended repeat event monitor potential referral to EP, though Ms. Manocchio and her daughter declined.  Ms. Carreira daughter wishes to review her recent episode with other family members and pass that information along to [place] before we pursue additional testing.  Hypertension: Mildly elevated diastolic pressure noted today.  Given report of low diastolic blood pressure at home and falls.  We will defer changes to her current regimen of full loaded pain.  History of  TIA: Continue secondary prevention with aspirin.  Patient no longer on a statin; defer management of this to Ms. Sellers PCP.     Dispo: Return to clinic in 3 months.  Signed, Yvonne Kendall, MD

## 2022-10-16 ENCOUNTER — Encounter: Payer: Self-pay | Admitting: Internal Medicine

## 2022-10-16 ENCOUNTER — Ambulatory Visit: Payer: PPO | Attending: Internal Medicine | Admitting: Internal Medicine

## 2022-10-16 VITALS — BP 118/96 | HR 58 | Ht 63.0 in | Wt 90.6 lb

## 2022-10-16 DIAGNOSIS — I441 Atrioventricular block, second degree: Secondary | ICD-10-CM | POA: Diagnosis not present

## 2022-10-16 DIAGNOSIS — Z8673 Personal history of transient ischemic attack (TIA), and cerebral infarction without residual deficits: Secondary | ICD-10-CM

## 2022-10-16 DIAGNOSIS — I10 Essential (primary) hypertension: Secondary | ICD-10-CM

## 2022-10-16 NOTE — Patient Instructions (Signed)
Medication Instructions:  Your physician recommends that you continue on your current medications as directed. Please refer to the Current Medication list given to you today.   *If you need a refill on your cardiac medications before your next appointment, please call your pharmacy*   Lab Work: No labs ordered today    Testing/Procedures: No test ordered today    Follow-Up: At [facility], you and your health needs are our priority.  As part of our continuing mission to provide you with exceptional heart care, we have created designated Provider Care Teams.  These Care Teams include your primary Cardiologist (physician) and Advanced Practice Providers (APPs -  Physician Assistants and Nurse Practitioners) who all work together to provide you with the care you need, when you need it.  We recommend signing up for the patient portal called "MyChart".  Sign up information is provided on this After Visit Summary.  MyChart is used to connect with patients for Virtual Visits (Telemedicine).  Patients are able to view lab/test results, encounter notes, upcoming appointments, etc.  Non-urgent messages can be sent to your provider as well.   To learn more about what you can do with MyChart, go to [email or web address].    Your next appointment:   3 month(s)  Provider:   You may see Yvonne Kendall, MD or one of the following Advanced Practice Providers on your designated Care Team:   Nicolasa Ducking, NP Eula Listen, PA-C Cadence Fransico Michael, PA-C Charlsie Quest, NP

## 2022-10-18 ENCOUNTER — Encounter: Payer: Self-pay | Admitting: Internal Medicine

## 2022-11-09 ENCOUNTER — Emergency Department: Payer: PPO

## 2022-11-09 ENCOUNTER — Other Ambulatory Visit: Payer: Self-pay

## 2022-11-09 ENCOUNTER — Inpatient Hospital Stay
Admission: EM | Admit: 2022-11-09 | Discharge: 2022-12-02 | DRG: 597 | Disposition: A | Discharge status: Home Health | Payer: PPO | Attending: Internal Medicine | Admitting: Internal Medicine

## 2022-11-09 DIAGNOSIS — R001 Bradycardia, unspecified: Secondary | ICD-10-CM | POA: Diagnosis not present

## 2022-11-09 DIAGNOSIS — C50812 Malignant neoplasm of overlapping sites of left female breast: Principal | ICD-10-CM | POA: Diagnosis present

## 2022-11-09 DIAGNOSIS — M8458XA Pathological fracture in neoplastic disease, other specified site, initial encounter for fracture: Secondary | ICD-10-CM | POA: Diagnosis present

## 2022-11-09 DIAGNOSIS — Z79811 Long term (current) use of aromatase inhibitors: Secondary | ICD-10-CM

## 2022-11-09 DIAGNOSIS — F0390 Unspecified dementia without behavioral disturbance: Secondary | ICD-10-CM | POA: Diagnosis present

## 2022-11-09 DIAGNOSIS — Z8673 Personal history of transient ischemic attack (TIA), and cerebral infarction without residual deficits: Secondary | ICD-10-CM

## 2022-11-09 DIAGNOSIS — G459 Transient cerebral ischemic attack, unspecified: Secondary | ICD-10-CM | POA: Diagnosis present

## 2022-11-09 DIAGNOSIS — I441 Atrioventricular block, second degree: Secondary | ICD-10-CM | POA: Diagnosis present

## 2022-11-09 DIAGNOSIS — D630 Anemia in neoplastic disease: Secondary | ICD-10-CM | POA: Diagnosis present

## 2022-11-09 DIAGNOSIS — Z79899 Other long term (current) drug therapy: Secondary | ICD-10-CM

## 2022-11-09 DIAGNOSIS — Z170 Estrogen receptor positive status [ER+]: Secondary | ICD-10-CM

## 2022-11-09 DIAGNOSIS — S91312A Laceration without foreign body, left foot, initial encounter: Secondary | ICD-10-CM | POA: Diagnosis present

## 2022-11-09 DIAGNOSIS — R531 Weakness: Secondary | ICD-10-CM

## 2022-11-09 DIAGNOSIS — L89153 Pressure ulcer of sacral region, stage 3: Secondary | ICD-10-CM | POA: Diagnosis present

## 2022-11-09 DIAGNOSIS — C720 Malignant neoplasm of spinal cord: Secondary | ICD-10-CM | POA: Diagnosis present

## 2022-11-09 DIAGNOSIS — D649 Anemia, unspecified: Secondary | ICD-10-CM | POA: Diagnosis not present

## 2022-11-09 DIAGNOSIS — Z66 Do not resuscitate: Secondary | ICD-10-CM | POA: Diagnosis present

## 2022-11-09 DIAGNOSIS — R4182 Altered mental status, unspecified: Principal | ICD-10-CM

## 2022-11-09 DIAGNOSIS — Z681 Body mass index (BMI) 19 or less, adult: Secondary | ICD-10-CM | POA: Diagnosis not present

## 2022-11-09 DIAGNOSIS — M899 Disorder of bone, unspecified: Secondary | ICD-10-CM | POA: Diagnosis not present

## 2022-11-09 DIAGNOSIS — R64 Cachexia: Secondary | ICD-10-CM | POA: Diagnosis present

## 2022-11-09 DIAGNOSIS — N179 Acute kidney failure, unspecified: Secondary | ICD-10-CM

## 2022-11-09 DIAGNOSIS — I119 Hypertensive heart disease without heart failure: Secondary | ICD-10-CM | POA: Diagnosis present

## 2022-11-09 DIAGNOSIS — S2242XA Multiple fractures of ribs, left side, initial encounter for closed fracture: Secondary | ICD-10-CM | POA: Diagnosis present

## 2022-11-09 DIAGNOSIS — L89323 Pressure ulcer of left buttock, stage 3: Secondary | ICD-10-CM | POA: Diagnosis present

## 2022-11-09 DIAGNOSIS — Z515 Encounter for palliative care: Secondary | ICD-10-CM

## 2022-11-09 DIAGNOSIS — Z7982 Long term (current) use of aspirin: Secondary | ICD-10-CM | POA: Diagnosis not present

## 2022-11-09 DIAGNOSIS — C7951 Secondary malignant neoplasm of bone: Secondary | ICD-10-CM | POA: Diagnosis present

## 2022-11-09 DIAGNOSIS — E43 Unspecified severe protein-calorie malnutrition: Secondary | ICD-10-CM | POA: Diagnosis present

## 2022-11-09 DIAGNOSIS — W19XXXA Unspecified fall, initial encounter: Secondary | ICD-10-CM | POA: Diagnosis present

## 2022-11-09 DIAGNOSIS — C50919 Malignant neoplasm of unspecified site of unspecified female breast: Secondary | ICD-10-CM | POA: Diagnosis not present

## 2022-11-09 DIAGNOSIS — E872 Acidosis, unspecified: Secondary | ICD-10-CM | POA: Diagnosis not present

## 2022-11-09 DIAGNOSIS — Z8249 Family history of ischemic heart disease and other diseases of the circulatory system: Secondary | ICD-10-CM

## 2022-11-09 DIAGNOSIS — Z5971 Insufficient health insurance coverage: Secondary | ICD-10-CM

## 2022-11-09 DIAGNOSIS — L853 Xerosis cutis: Secondary | ICD-10-CM | POA: Diagnosis present

## 2022-11-09 DIAGNOSIS — M898X5 Other specified disorders of bone, thigh: Secondary | ICD-10-CM

## 2022-11-09 DIAGNOSIS — M810 Age-related osteoporosis without current pathological fracture: Secondary | ICD-10-CM | POA: Diagnosis present

## 2022-11-09 DIAGNOSIS — I440 Atrioventricular block, first degree: Secondary | ICD-10-CM | POA: Diagnosis present

## 2022-11-09 DIAGNOSIS — L8990 Pressure ulcer of unspecified site, unspecified stage: Secondary | ICD-10-CM | POA: Insufficient documentation

## 2022-11-09 DIAGNOSIS — M84550A Pathological fracture in neoplastic disease, pelvis, initial encounter for fracture: Secondary | ICD-10-CM | POA: Diagnosis present

## 2022-11-09 DIAGNOSIS — I10 Essential (primary) hypertension: Secondary | ICD-10-CM | POA: Diagnosis present

## 2022-11-09 DIAGNOSIS — Z1732 Human epidermal growth factor receptor 2 negative status: Secondary | ICD-10-CM

## 2022-11-09 LAB — TROPONIN I (HIGH SENSITIVITY)
Troponin I (High Sensitivity): 43 ng/L — ABNORMAL HIGH (ref <18)
Troponin I (High Sensitivity): 44 ng/L — ABNORMAL HIGH (ref <18)

## 2022-11-09 LAB — HEPATIC FUNCTION PANEL
ALT: 23 U/L (ref 0–44)
ALT: 24 U/L (ref 0–44)
AST: 61 U/L — ABNORMAL HIGH (ref 15–41)
AST: 61 U/L — ABNORMAL HIGH (ref 15–41)
Albumin: 3.1 g/dL — ABNORMAL LOW (ref 3.5–5.0)
Albumin: 2.8 g/dL — ABNORMAL LOW (ref 3.5–5.0)
Alkaline Phosphatase: 172 U/L — ABNORMAL HIGH (ref 38–126)
Alkaline Phosphatase: 164 U/L — ABNORMAL HIGH (ref 38–126)
Bilirubin, Direct: 0.3 mg/dL — ABNORMAL HIGH (ref 0.0–0.2)
Bilirubin, Direct: 0.2 mg/dL (ref 0.0–0.2)
Indirect Bilirubin: 0.8 mg/dL (ref 0.3–0.9)
Indirect Bilirubin: 0.9 mg/dL (ref 0.3–0.9)
Total Bilirubin: 1.1 mg/dL (ref 0.3–1.2)
Total Bilirubin: 1.1 mg/dL (ref 0.3–1.2)
Total Protein: 6.3 g/dL — ABNORMAL LOW (ref 6.5–8.1)
Total Protein: 6.3 g/dL — ABNORMAL LOW (ref 6.5–8.1)

## 2022-11-09 LAB — IRON AND TIBC
Iron: 52 ug/dL (ref 28–170)
Saturation Ratios: 17 % (ref 10.4–31.8)
TIBC: 305 ug/dL (ref 250–450)
UIBC: 253 ug/dL

## 2022-11-09 LAB — BASIC METABOLIC PANEL
Anion gap: 13 (ref 5–15)
BUN: 54 mg/dL — ABNORMAL HIGH (ref 8–23)
CO2: 20 mmol/L — ABNORMAL LOW (ref 22–32)
Calcium: 8.7 mg/dL — ABNORMAL LOW (ref 8.9–10.3)
Chloride: 109 mmol/L (ref 98–111)
Creatinine, Ser: 1.27 mg/dL — ABNORMAL HIGH (ref 0.44–1.00)
GFR, Estimated: 40 mL/min — ABNORMAL LOW (ref >60)
Glucose, Bld: 104 mg/dL — ABNORMAL HIGH (ref 70–99)
Potassium: 4.7 mmol/L (ref 3.5–5.1)
Sodium: 142 mmol/L (ref 135–145)

## 2022-11-09 LAB — CBC
HCT: 32.3 % — ABNORMAL LOW (ref 36.0–46.0)
Hemoglobin: 9.4 g/dL — ABNORMAL LOW (ref 12.0–15.0)
MCH: 23.4 pg — ABNORMAL LOW (ref 26.0–34.0)
MCHC: 29.1 g/dL — ABNORMAL LOW (ref 30.0–36.0)
MCV: 80.3 fL (ref 80.0–100.0)
Platelets: 239 10*3/uL (ref 150–400)
RBC: 4.02 MIL/uL (ref 3.87–5.11)
RDW: 17.7 % — ABNORMAL HIGH (ref 11.5–15.5)
WBC: 8.6 10*3/uL (ref 4.0–10.5)
nRBC: 1.3 % — ABNORMAL HIGH (ref 0.0–0.2)

## 2022-11-09 LAB — URINALYSIS, ROUTINE W REFLEX MICROSCOPIC
Bilirubin Urine: NEGATIVE
Glucose, UA: NEGATIVE mg/dL
Hgb urine dipstick: NEGATIVE
Ketones, ur: NEGATIVE mg/dL
Leukocytes,Ua: NEGATIVE
Nitrite: NEGATIVE
Protein, ur: NEGATIVE mg/dL
Specific Gravity, Urine: 1.016 (ref 1.005–1.030)
pH: 5 (ref 5.0–8.0)

## 2022-11-09 LAB — FERRITIN: Ferritin: 502 ng/mL — ABNORMAL HIGH (ref 11–307)

## 2022-11-09 MED ORDER — ONDANSETRON HCL 4 MG PO TABS: 4.0000 mg | ORAL_TABLET | Freq: Four times a day (QID) | ORAL | Status: AC | PRN

## 2022-11-09 MED ORDER — HYDRALAZINE HCL 20 MG/ML IJ SOLN: 5.0000 mg | Freq: Three times a day (TID) | INTRAMUSCULAR | Status: AC | PRN

## 2022-11-09 MED ORDER — ACETAMINOPHEN 325 MG PO TABS
650.0000 mg | ORAL_TABLET | Freq: Four times a day (QID) | ORAL | Status: DC | PRN
  Administered 2022-11-11: 650 mg via ORAL
  Filled 2022-11-09: qty 2

## 2022-11-09 MED ORDER — VITAMIN D3 25 MCG (1000 UNIT) PO TABS
1000.0000 [IU] | ORAL_TABLET | Freq: Every day | ORAL | Status: DC
  Administered 2022-11-10 – 2022-12-02 (×23): 1000 [IU] via ORAL
  Filled 2022-11-09 (×45): qty 1

## 2022-11-09 MED ORDER — LACTATED RINGERS IV BOLUS
1000.0000 mL | Freq: Once | INTRAVENOUS | Status: AC
  Administered 2022-11-09: 1000 mL via INTRAVENOUS

## 2022-11-09 MED ORDER — HYDROCERIN EX CREA
1.0000 | TOPICAL_CREAM | Freq: Two times a day (BID) | CUTANEOUS | Status: AC
  Administered 2022-11-09 – 2022-11-19 (×20): 1 via TOPICAL
  Filled 2022-11-09: qty 113

## 2022-11-09 MED ORDER — ACETAMINOPHEN 650 MG RE SUPP: 650.0000 mg | Freq: Four times a day (QID) | RECTAL | Status: DC | PRN

## 2022-11-09 MED ORDER — ENOXAPARIN SODIUM 30 MG/0.3ML IJ SOSY
30.0000 mg | PREFILLED_SYRINGE | Freq: Every day | INTRAMUSCULAR | Status: DC
  Administered 2022-11-09 – 2022-12-01 (×23): 30 mg via SUBCUTANEOUS
  Filled 2022-11-09 (×23): qty 0.3

## 2022-11-09 MED ORDER — IOHEXOL 300 MG/ML  SOLN
75.0000 mL | Freq: Once | INTRAMUSCULAR | Status: AC | PRN
  Administered 2022-11-09: 75 mL via INTRAVENOUS

## 2022-11-09 MED ORDER — FELODIPINE ER 5 MG PO TB24
5.0000 mg | ORAL_TABLET | Freq: Every day | ORAL | Status: DC
  Administered 2022-11-09 – 2022-12-02 (×23): 5 mg via ORAL
  Filled 2022-11-09 (×25): qty 1

## 2022-11-09 MED ORDER — MORPHINE SULFATE (PF) 2 MG/ML IV SOLN: 2.0000 mg | INTRAVENOUS | Status: AC | PRN

## 2022-11-09 MED ORDER — SODIUM CHLORIDE 0.9 % IV SOLN: INTRAVENOUS | Status: DC

## 2022-11-09 MED ORDER — ONDANSETRON HCL 4 MG/2ML IJ SOLN: 4.0000 mg | Freq: Four times a day (QID) | INTRAMUSCULAR | Status: AC | PRN

## 2022-11-09 MED ORDER — CALCITONIN (SALMON) 200 UNIT/ACT NA SOLN
1.0000 | Freq: Every day | NASAL | Status: DC
  Administered 2022-11-10 – 2022-12-02 (×21): 1 via NASAL
  Filled 2022-11-09: qty 3.7

## 2022-11-09 MED ORDER — FENTANYL CITRATE PF 50 MCG/ML IJ SOSY: 25.0000 ug | PREFILLED_SYRINGE | INTRAMUSCULAR | Status: AC | PRN

## 2022-11-09 MED ORDER — SENNOSIDES-DOCUSATE SODIUM 8.6-50 MG PO TABS
1.0000 | ORAL_TABLET | Freq: Every evening | ORAL | Status: DC | PRN
  Administered 2022-11-17 – 2022-11-27 (×3): 1 via ORAL
  Filled 2022-11-09 (×3): qty 1

## 2022-11-09 MED ORDER — LATANOPROSTENE BUNOD 0.024 % OP SOLN
1.0000 [drp] | Freq: Every day | OPHTHALMIC | Status: DC
  Administered 2022-11-12 – 2022-12-01 (×16): 1 [drp] via OPHTHALMIC
  Filled 2022-11-09: qty 2.5

## 2022-11-09 MED ORDER — ADULT MULTIVITAMIN W/MINERALS CH
1.0000 | ORAL_TABLET | Freq: Every day | ORAL | Status: DC
  Administered 2022-11-10 – 2022-12-02 (×22): 1 via ORAL
  Filled 2022-11-09 (×23): qty 1

## 2022-11-09 MED ORDER — ASPIRIN 81 MG PO TBEC
81.0000 mg | DELAYED_RELEASE_TABLET | Freq: Every day | ORAL | Status: DC
  Administered 2022-11-09 – 2022-12-02 (×24): 81 mg via ORAL
  Filled 2022-11-09 (×24): qty 1

## 2022-11-09 MED ORDER — LATANOPROST 0.005 % OP SOLN
1.0000 [drp] | Freq: Every day | OPHTHALMIC | Status: DC
  Administered 2022-11-09 – 2022-12-01 (×23): 1 [drp] via OPHTHALMIC
  Filled 2022-11-09 (×2): qty 2.5

## 2022-11-09 MED ORDER — BRIMONIDINE TARTRATE 0.15 % OP SOLN
1.0000 [drp] | Freq: Two times a day (BID) | OPHTHALMIC | Status: DC
  Administered 2022-11-09 – 2022-12-02 (×46): 1 [drp] via OPHTHALMIC
  Filled 2022-11-09: qty 5

## 2022-11-09 NOTE — Assessment & Plan Note (Signed)
With skin tears, irritation consistent with chronic scratching Lubriderm lotion twice daily, 10 days ordered

## 2022-11-09 NOTE — H&P (Addendum)
History and Physical   Cassandra Patterson LKG:[id number] DOB: Jun 19, 1928 DOA: 11/09/2022  PCP: Leanna Sato, MD  Outpatient Specialists: Dr. Yvonne Kendall, [facility] cardiology Patient coming from: Home  I have personally briefly reviewed patient's old medical records in [facility] EMR.  Chief Concern: Weakness, left heel tear concern for infection, increased sleepiness  HPI: [name] is a 89 year old female with history of Mobitz type I second-degree heart block, history of TIA, history of hypertension, who presents to the emergency department for chief concerns of worsening generalized weakness.  Vitals in the ED showed temperature of 98, respiration rate 20, heart rate of 85, blood pressure 99/85, improved to 126/72, SpO2 99% on room air.  Serum sodium is  142, potassium 4.3, chloride 109, bicarb 20, BUN of 54, serum creatinine 1.27, EGFR 40, nonfasting blood glucose 104, WBC 8.6, hemoglobin 9.6, platelets of 239.  Chest x-ray 2 view: Moderate cardiomegaly.  No active lung disease.  Multiple left lateral rib fractures, at least 1 shows periosteal new bone formation.  CT head wo contrast and CT cervical spine wo contrast: Widespread osseous metastatic disease including deposits that erodes through the inner table.  No acute intracranial finding.  No visible brain metastasis.  CT abdomen pelvis w contrast: Multiple lytic bone lesions throughout lumbosacral spine, left ischium, bilateral iliac bones, and ribs.  Pathologic fracture inferior left ischial tuberosity, T12, L5 vertebral bodies.  3.1 cm soft tissue mass of the left lateral body wall.  3.7 cm cystic lesion in the left breast.  Cholelithiasis.  Aortic atherosclerosis.  ED treatment: LR 1 L bolus. ---------------------------- At bedside, patient patient was able to tell me her name, she states that she is 89 years old.  She knows the current calendar year and she knows she is in the hospital.  She notes her 2 sisters  are at bedside.  She reports generalized weakness, is unable to tell me for how long this has been ongoing.  She denies being in pain currently. She endorses weight loss however denies lack of appetite.  Family states that she eats well.  Social history: She lives is on her own.  Her sister lives next-door.  Her sister helps her with her medication.  She is the eldest of all the siblings.  She denies tobacco, EtOH, recreational drug use.  She is retired and formerly worked on a farm.  ROS: Constitutional: no weight change, no fever ENT/Mouth: no sore throat, no rhinorrhea Eyes: no eye pain, no vision changes Cardiovascular: no chest pain, no dyspnea,  no edema, no palpitations Respiratory: no cough, no sputum, no wheezing Gastrointestinal: no nausea, no vomiting, no diarrhea, no constipation Genitourinary: no urinary incontinence, no dysuria, no hematuria Musculoskeletal: no arthralgias, no myalgias Skin: no skin lesions, no pruritus, Neuro: + weakness, no loss of consciousness, no syncope Psych: no anxiety, no depression, no decrease appetite Heme/Lymph: no bruising, no bleeding  ED Course: Discussed with emergency medicine provider, patient requiring hospitalization for chief concerns of new widespread metastatic disease.  Assessment/Plan  Principal Problem:   Weakness Active Problems:   AKI (acute kidney injury) (HCC)   Lytic bone lesion of hip   Severe protein-calorie malnutrition (HCC)   Dry skin   Essential hypertension   First degree AV block   Mobitz type 1 second degree AV block   TIA (transient ischemic attack)   History of TIA (transient ischemic attack)   Assessment and Plan:  * Weakness Suspect secondary to widespread metastatic disease Primary carcinoma  unclear at this time Symptomatic support: Sodium chloride infusion at 100 mL/h, 2 days ordered Registered dietitian has been consulted  Lytic bone lesion of hip Suspect metastatic bone lesions, widespread  including lumbosacral spine, left ischium, bilateral iliac bones, ribs, pathologic fractures at the inferior left ischial tuberosity, T12, L5 vertebral bodies Patient and family would like to speak with medical oncology to understand what their options are and if there are treatments available Medical oncology has been consulted to Dr. Cathie Hoops via secure chat, staff message, and Epic order  AKI (acute kidney injury) (HCC) Secondary to poor p.o. intake Baseline serum creatinine is 0.79-0.81/eGFR greater than 60 Status post LR 1 L bolus per EDP Continue with sodium chloride infusion at 100 mL/h, 2 days ordered Recheck BMP in a.m.  Severe protein-calorie malnutrition [name]) Registered dietitian has been consulted  Dry skin With skin tears, irritation consistent with chronic scratching Lubriderm lotion twice daily, 10 days ordered  History of TIA (transient ischemic attack) Aspirin 81 mg daily resumed  Essential hypertension Home felodipine 5 mg daily resumed Hydralazine 5 mg IV every 8 hours as needed for SBP > 165, 5 days ordered  Osteoporosis-vitamin D 1000 units daily, calcitonin nasal spray alternating with nares daily  Chart reviewed.   DVT prophylaxis: Enoxaparin Code Status: Full code, confirmed with patient at bedside Diet: Regular diet Family Communication: Updated patient's sisters, Cassandra Patterson and Cassandra Patterson at bedside Disposition Plan: Pending clinical course Consults called: Medical oncology Admission status: MedSurg, inpatient  Past Medical History:  Diagnosis Date   Cataracts, bilateral    Glaucoma    Hypertension    Mobitz type 1 second degree atrioventricular block    TIA (transient ischemic attack)    Past Surgical History:  Procedure Laterality Date   CATARACT EXTRACTION     Social History:  reports that she has never smoked. She has never used smokeless tobacco. She reports that she does not drink alcohol and does not use drugs.  No Known Allergies Family History   Problem Relation Age of Onset   Hypertension Mother    Heart disease Neg Hx    Family history: Family history reviewed and not pertinent.  Prior to Admission medications   Medication Sig Start Date End Date Taking? Authorizing Provider  acetaminophen (TYLENOL) 650 MG CR tablet Take 650 mg by mouth every 8 (eight) hours as needed for pain.    [provider]  ammonium lactate (AMLACTIN) 12 % cream Apply topically as needed. 07/24/19   [provider]  aspirin EC 81 MG tablet Take 81 mg by mouth daily. Swallow whole.    [provider]  brimonidine (ALPHAGAN) 0.15 % ophthalmic solution Place 1 drop into both eyes 2 (two) times daily. 12/12/19   [provider]  calcitonin, salmon, (MIACALCIN/FORTICAL) 200 UNIT/ACT nasal spray Place 1 spray into alternate nostrils daily. 10/10/22   [provider]  Cholecalciferol (VITAMIN D-3) 25 MCG (1000 UT) CAPS Take by mouth daily.    [provider]  COD LIVER OIL PO Take by mouth as needed.    [provider]  felodipine (PLENDIL) 5 MG 24 hr tablet TAKE 1 TABLET(5 MG) BY MOUTH EVERY DAY 08/14/22   End, Cristal Deer, MD  Latanoprostene Bunod (VYZULTA OP) Apply 1 drop to eye at bedtime. Right eye    [provider]  LUMIGAN 0.01 % SOLN Place 1 drop into the left eye at bedtime. 06/18/21   [provider]  Multiple Vitamins-Minerals (ONE-A-DAY WOMENS PO) Take 1 tablet  by mouth daily.    [provider]   Physical Exam: Vitals:   11/09/22 1031 11/09/22 1051 11/09/22 1053  BP: 99/85    Pulse: 85    Resp: 20    Temp:  98 F (36.7 C)   TempSrc:  Oral   Weight:   37.2 kg  Height:   5\' 3"  (1.6 m)   Constitutional: appears frail, cachectic, malnourished Eyes: PERRL, lids and conjunctivae normal HENMT: Bilateral temporal wasting, mucous membranes are dry. Posterior pharynx clear of any exudate or lesions. Age-appropriate dentition. Hearing appropriate Neck: normal,  supple, no masses, no thyromegaly Respiratory: clear to auscultation bilaterally, no wheezing, no crackles. Normal respiratory effort. No accessory muscle use.  Cardiovascular: Regular rate and rhythm, no murmurs / rubs / gallops. No extremity edema. 2+ pedal pulses. No carotid bruits.  Abdomen: Scaphoid abdomen, no tenderness, no masses palpated, no hepatosplenomegaly. Bowel sounds positive.  Musculoskeletal: no clubbing / cyanosis. No joint deformity upper and lower extremities. Good ROM, no contractures, no atrophy. Normal muscle tone.  Skin: Multiple chronic skin tears, consistent with scratching   Neurologic: Sensation intact.  Generalized weakness Psychiatric: Unable to judgment and insight as patient is depressed and tearful with new diagnosis information. Alert and oriented x 3.  Depressed mood.   EKG: independently reviewed, showing sinus rhythm/ectopic with rate of 82, QTc 448  Chest x-ray on Admission: I personally reviewed and I agree with radiologist reading as below.  CT ABDOMEN PELVIS W CONTRAST  Result Date: 11/09/2022 CLINICAL DATA:  Acute abdominal pain EXAM: CT ABDOMEN AND PELVIS WITH CONTRAST TECHNIQUE: Multidetector CT imaging of the abdomen and pelvis was performed using the standard protocol following bolus administration of intravenous contrast. RADIATION DOSE REDUCTION: This exam was performed according to the departmental dose-optimization program which includes automated exposure control, adjustment of the mA and/or kV according to patient size and/or use of iterative reconstruction technique. CONTRAST:  75mL OMNIPAQUE IOHEXOL 300 MG/ML  SOLN COMPARISON:  None Available. FINDINGS: Lower chest: T9 vertebral body lytic metastasis. Multiple lytic rib metastases. 3.1 cm soft tissue mass left lateral body wall. No pleural or pericardial effusion. Dependent atelectasis in the lung bases. 3.7 cm cystic lesion in the left breast. Hepatobiliary: Subcentimeter calcified stone in the  dependent aspect of the nondilated gallbladder. No focal liver lesion or biliary ductal dilatation. Pancreas: Unremarkable. No pancreatic ductal dilatation or surrounding inflammatory changes. Spleen: Normal in size without focal abnormality. Adrenals/Urinary Tract: No adrenal mass. Symmetric renal parenchymal enhancement without focal lesion or evident urolithiasis. Urinary bladder nondistended. Stomach/Bowel: Stomach decompressed. Small bowel nondilated. Normal appendix. The colon is partially distended, without acute finding. Vascular/Lymphatic: Moderate scattered calcified aortoiliac atheromatous plaque without aneurysm or stenosis. Portal vein patent. No abdominal or pelvic adenopathy. Reproductive: Status post hysterectomy. No adnexal masses. Other: No ascites.  No free air. Musculoskeletal: Multiple lytic bone lesions throughout the lumbosacral spine, left ischium, bilateral iliac bones. Lytic lesion in the inferior left ischial tuberosity with pathologic fracture, minimally displaced. Pathologic compression deformity of L5 with a greater than 50% loss of height. Mild compression deformity of T12. fixation hardware in the posterior left acetabulum. IMPRESSION: 1. Multiple lytic bone lesions throughout the lumbosacral spine, left ischium, bilateral iliac bones, and ribs. Pathologic fractures of the inferior left ischial tuberosity, T12 and L5 vertebral bodies. 2. 3.1 cm soft tissue mass left lateral body wall. 3. 3.7 cm cystic lesion in the left breast. 4. Cholelithiasis. 5.  Aortic Atherosclerosis (ICD10-I70.0). Electronically Signed   By: Algis Downs  Deanne Coffer M.D.   On: 11/09/2022 13:53   CT Head Wo Contrast  Result Date: 11/09/2022 CLINICAL DATA:  Mental status change with unknown cause. Neck trauma EXAM: CT HEAD WITHOUT CONTRAST CT CERVICAL SPINE WITHOUT CONTRAST TECHNIQUE: Multidetector CT imaging of the head and cervical spine was performed following the standard protocol without intravenous contrast.  Multiplanar CT image reconstructions of the cervical spine were also generated. RADIATION DOSE REDUCTION: This exam was performed according to the departmental dose-optimization program which includes automated exposure control, adjustment of the mA and/or kV according to patient size and/or use of iterative reconstruction technique. COMPARISON:  Brain MRI 01/07/2021 FINDINGS: CT HEAD FINDINGS Brain: No evidence of acute infarction, hemorrhage, hydrocephalus, extra-axial collection or mass lesion/mass effect. Small right cerebellar infarct, chronic by prior MRI. Generalized brain atrophy. Vascular: No hyperdense vessel or unexpected calcification. Skull: Multiple lytic lesions scattered throughout the calvarium. Parasagittal right frontal and parasagittal left parietal deposits erode the inner table. Additional notable lesion in the left sphenoid wing eroding the inner table. Sinuses/Orbits: No acute finding CT CERVICAL SPINE FINDINGS Alignment: Normal Skull base and vertebrae: No acute fracture. Multiple lytic bone lesions seen in the C3, C6, C7, T1, T2, and T3 bodies. Notable posterior element deposits at T1-T4. Partially covered nonacute right clavicle fracture with bulky callus that could obscure underlying lesion. Soft tissues and spinal canal: No prevertebral fluid or swelling. No visible canal hematoma. Disc levels:  Ordinary degenerative spurring. Upper chest: Sizable metastatic deposit at the right second rib posteriorly. IMPRESSION: Widespread osseous metastatic disease including deposits that erodes through the inner table. No acute intracranial finding.  No visible brain metastasis. Electronically Signed   By: Tiburcio Pea M.D.   On: 11/09/2022 11:45   CT Cervical Spine Wo Contrast  Result Date: 11/09/2022 CLINICAL DATA:  Mental status change with unknown cause. Neck trauma EXAM: CT HEAD WITHOUT CONTRAST CT CERVICAL SPINE WITHOUT CONTRAST TECHNIQUE: Multidetector CT imaging of the head and  cervical spine was performed following the standard protocol without intravenous contrast. Multiplanar CT image reconstructions of the cervical spine were also generated. RADIATION DOSE REDUCTION: This exam was performed according to the departmental dose-optimization program which includes automated exposure control, adjustment of the mA and/or kV according to patient size and/or use of iterative reconstruction technique. COMPARISON:  Brain MRI 01/07/2021 FINDINGS: CT HEAD FINDINGS Brain: No evidence of acute infarction, hemorrhage, hydrocephalus, extra-axial collection or mass lesion/mass effect. Small right cerebellar infarct, chronic by prior MRI. Generalized brain atrophy. Vascular: No hyperdense vessel or unexpected calcification. Skull: Multiple lytic lesions scattered throughout the calvarium. Parasagittal right frontal and parasagittal left parietal deposits erode the inner table. Additional notable lesion in the left sphenoid wing eroding the inner table. Sinuses/Orbits: No acute finding CT CERVICAL SPINE FINDINGS Alignment: Normal Skull base and vertebrae: No acute fracture. Multiple lytic bone lesions seen in the C3, C6, C7, T1, T2, and T3 bodies. Notable posterior element deposits at T1-T4. Partially covered nonacute right clavicle fracture with bulky callus that could obscure underlying lesion. Soft tissues and spinal canal: No prevertebral fluid or swelling. No visible canal hematoma. Disc levels:  Ordinary degenerative spurring. Upper chest: Sizable metastatic deposit at the right second rib posteriorly. IMPRESSION: Widespread osseous metastatic disease including deposits that erodes through the inner table. No acute intracranial finding.  No visible brain metastasis. Electronically Signed   By: Tiburcio Pea M.D.   On: 11/09/2022 11:45   DG Chest 2 View  Result Date: 11/09/2022 CLINICAL DATA:  Chest pain.  Back and foot wounds. EXAM: CHEST - 2 VIEW COMPARISON:  01/06/2021 FINDINGS: Moderate  cardiomegaly again noted. Ectasia and atherosclerotic calcification of the thoracic aorta again seen. Both lungs remain clear. No evidence of pneumothorax or pleural effusion. Several displaced left lateral rib fracture deformities are seen several displaced rib fractures are seen involving the left lateral 4th through 9th ribs, at least 1 of which shows periosteal new bone formation. IMPRESSION: Moderate cardiomegaly. No active lung disease. Multiple left lateral rib fractures, at least 1 of which shows periosteal new bone formation. Electronically Signed   By: Danae Orleans M.D.   On: 11/09/2022 11:16    Labs on Admission: I have personally reviewed following labs  CBC: Recent Labs  Lab 11/09/22 1144  WBC 8.6  HGB 9.4*  HCT 32.3*  MCV 80.3  PLT 239   Basic Metabolic Panel: Recent Labs  Lab 11/09/22 1144  NA 142  K 4.7  CL 109  CO2 20*  GLUCOSE 104*  BUN 54*  CREATININE 1.27*  CALCIUM 8.7*   GFR: Estimated Creatinine Clearance: 16.6 mL/min (A) (by C-G formula based on SCr of 1.27 mg/dL (H)).  Liver Function Tests: Recent Labs  Lab 11/09/22 1144 11/09/22 1350  AST 61* 61*  ALT 23 24  ALKPHOS 172* 164*  BILITOT 1.1 1.1  PROT 6.3* 6.3*  ALBUMIN 3.1* 2.8*   Urine analysis:    Component Value Date/Time   COLORURINE YELLOW (A) 11/09/2022 1303   APPEARANCEUR CLOUDY (A) 11/09/2022 1303   LABSPEC 1.016 11/09/2022 1303   PHURINE 5.0 11/09/2022 1303   GLUCOSEU NEGATIVE 11/09/2022 1303   HGBUR NEGATIVE 11/09/2022 1303   BILIRUBINUR NEGATIVE 11/09/2022 1303   KETONESUR NEGATIVE 11/09/2022 1303   PROTEINUR NEGATIVE 11/09/2022 1303   NITRITE NEGATIVE 11/09/2022 1303   LEUKOCYTESUR NEGATIVE 11/09/2022 1303   This document was prepared using Dragon Voice Recognition software and may include unintentional dictation errors.  Dr. Sedalia Muta [facility]  If 7PM-7AM, please contact overnight-coverage provider If 7AM-7PM, please contact day attending  provider [email or web address]  11/09/2022, 4:00 PM

## 2022-11-09 NOTE — ED Notes (Signed)
Advised nurse that patient has ready bed 

## 2022-11-09 NOTE — Assessment & Plan Note (Addendum)
Suspect metastatic bone lesions, widespread including lumbosacral spine, left ischium, bilateral iliac bones, ribs, pathologic fractures at the inferior left ischial tuberosity, T12, L5 vertebral bodies Patient and family would like to speak with medical oncology to understand what their options are and if there are treatments available Medical oncology has been consulted to Dr. Cathie Hoops via secure chat, staff message, and Epic order

## 2022-11-09 NOTE — Progress Notes (Signed)
PHARMACIST - PHYSICIAN COMMUNICATION  CONCERNING:  Enoxaparin (Lovenox) for DVT Prophylaxis    RECOMMENDATION: Patient was prescribed enoxaprin 40mg  q24 hours for VTE prophylaxis.   Filed Weights   11/09/22 1053  Weight: 37.2 kg (82 lb)    Body mass index is 14.53 kg/m.  Estimated Creatinine Clearance: 16.6 mL/min (A) (by C-G formula based on SCr of 1.27 mg/dL (H)).   Patient is candidate for enoxaparin 30mg  every 24 hours based on CrCl <38ml/min or Weight <45kg  DESCRIPTION: Pharmacy has adjusted enoxaparin dose per [facility] policy.  Patient is now receiving enoxaparin 30 mg every 24 hours    Barrie Folk, PharmD Clinical Pharmacist  11/09/2022 2:50 PM

## 2022-11-09 NOTE — Hospital Course (Signed)
Ms. Shareta Kuenzi is a 89 year old female with history of Mobitz type I second-degree heart block, history of TIA, history of hypertension, who presents to the emergency department for chief concerns of worsening generalized weakness.  Vitals in the ED showed temperature of 98, respiration rate 20, heart rate of 85, blood pressure 99/85, improved to 126/72, SpO2 99% on room air.  Serum sodium is  142, potassium 4.3, chloride 109, bicarb 20, BUN of 54, serum creatinine 1.27, EGFR 40, nonfasting blood glucose 104, WBC 8.6, hemoglobin 9.6, platelets of 239.  Chest x-ray 2 view: Moderate cardiomegaly.  No active lung disease.  Multiple left lateral rib fractures, at least 1 shows periosteal new bone formation.  CT head wo contrast and CT cervical spine wo contrast: Widespread osseous metastatic disease including deposits that erodes through the inner table.  No acute intracranial finding.  No visible brain metastasis.  CT abdomen pelvis w contrast: Multiple lytic bone lesions throughout lumbosacral spine, left ischium, bilateral iliac bones, and ribs.  Pathologic fracture inferior left ischial tuberosity, T12, L5 vertebral bodies.  3.1 cm soft tissue mass of the left lateral body wall.  3.7 cm cystic lesion in the left breast.  Cholelithiasis.  Aortic atherosclerosis.  ED treatment: LR 1 L bolus.

## 2022-11-09 NOTE — Assessment & Plan Note (Addendum)
Home felodipine 5 mg daily resumed Hydralazine 5 mg IV every 8 hours as needed for SBP > 165, 5 days ordered

## 2022-11-09 NOTE — ED Provider Notes (Signed)
[facility] Provider Note    Event Date/Time   First MD Initiated Contact with Patient 11/09/22 1037     (approximate)   History   Chief Complaint Laceration and Altered Mental Status   HPI  Cassandra Patterson is a 89 y.o. female with past medical history of hypertension, TIA, second-degree heart block, and dementia who presents to the ED for altered mental status.  Sister is at bedside and states that patient lives alone however she lives next-door.  When she went to check on the patient this morning, she found the patient much slower to respond than usual, seemed very weak compared to her baseline.  Sister reports that patient typically ambulates slowly with a walker at baseline and is also able to verbally communicate.  Sister attempted to have patient stand up and walk, however her left foot got caught in the walker and patient suffered a skin tear to her left heel.  Sister also reports that patient has been developing a wound to her sacral area and they are concerned it could be infected.  Patient unable to answer any questions at this time.     Physical Exam   Triage Vital Signs: ED Triage Vitals  Encounter Vitals Group     BP 11/09/22 1031 99/85     Systolic BP Percentile --      Diastolic BP Percentile --      Pulse Rate 11/09/22 1031 85     Resp 11/09/22 1031 20     Temp --      Temp src --      SpO2 --      Weight --      Height --      Head Circumference --      Peak Flow --      Pain Score 11/09/22 1030 3     Pain Loc --      Pain Education --      Exclude from Growth Chart --     Most recent vital signs: Vitals:   11/09/22 1031 11/09/22 1051  BP: 99/85   Pulse: 85   Resp: 20   Temp:  98 F (36.7 C)    Constitutional: Somnolent but arousable to voice, no verbal response. Eyes: Conjunctivae are normal.  Pupils equal, round, and reactive to light bilaterally. Head: Atraumatic. Nose: No congestion/rhinnorhea. Mouth/Throat:  Mucous membranes are moist.  Cardiovascular: Normal rate, regular rhythm. Grossly normal heart sounds.  2+ radial pulses bilaterally. Respiratory: Normal respiratory effort.  No retractions. Lungs CTAB. Gastrointestinal: Soft and diffusely tender to palpation with no rebound or guarding. No distention. Musculoskeletal: No lower extremity tenderness nor edema.  Skin tear to left heel with no active bleeding.  Sacral decubitus ulcer noted with no surrounding erythema, edema, or warmth. Neurologic: Patient able to follow commands after some encouragement, she is globally weak but no gross focal neurologic deficits are appreciated.    ED Results / Procedures / Treatments   Labs (all labs ordered are listed, but only abnormal results are displayed) Labs Reviewed  BASIC METABOLIC PANEL - Abnormal; Notable for the following components:      Result Value   CO2 20 (*)    Glucose, Bld 104 (*)    BUN 54 (*)    Creatinine, Ser 1.27 (*)    Calcium 8.7 (*)    GFR, Estimated 40 (*)    All other components within normal limits  CBC - Abnormal; Notable for the following  components:   Hemoglobin 9.4 (*)    HCT 32.3 (*)    MCH 23.4 (*)    MCHC 29.1 (*)    RDW 17.7 (*)    nRBC 1.3 (*)    All other components within normal limits  HEPATIC FUNCTION PANEL - Abnormal; Notable for the following components:   Total Protein 6.3 (*)    Albumin 3.1 (*)    AST 61 (*)    Alkaline Phosphatase 172 (*)    Bilirubin, Direct 0.3 (*)    All other components within normal limits  URINALYSIS, ROUTINE W REFLEX MICROSCOPIC - Abnormal; Notable for the following components:   Color, Urine YELLOW (*)    APPearance CLOUDY (*)    All other components within normal limits  TROPONIN I (HIGH SENSITIVITY) - Abnormal; Notable for the following components:   Troponin I (High Sensitivity) 43 (*)    All other components within normal limits  TROPONIN I (HIGH SENSITIVITY) - Abnormal; Notable for the following components:    Troponin I (High Sensitivity) 44 (*)    All other components within normal limits     EKG  ED ECG REPORT I, Chesley Noon, the attending physician, personally viewed and interpreted this ECG.   Date: 11/09/2022  EKG Time: 10:41  Rate: 82  Rhythm: normal sinus rhythm  Axis: LAD  Intervals:first-degree A-V block  and left anterior fascicular block  ST&T Change: Inferolateral T wave inversions  RADIOLOGY CT head reviewed and interpreted by me with no hemorrhage or midline shift, diffuse bony lesions noted.  PROCEDURES:  Critical Care performed: No  Procedures   MEDICATIONS ORDERED IN ED: Medications  lactated ringers bolus 1,000 mL (1,000 mLs Intravenous New Bag/Given 11/09/22 1146)  iohexol (OMNIPAQUE) 300 MG/ML solution 75 mL (75 mLs Intravenous Contrast Given 11/09/22 1315)     IMPRESSION / MDM / ASSESSMENT AND PLAN / ED COURSE  I reviewed the triage vital signs and the nursing notes.                              89 y.o. female with past medical history of hypertension, TIA, second-degree heart block, and dementia who presents to the ED for altered mental status and decreased responsiveness since her sister went to check on her this morning.  Patient's presentation is most consistent with acute presentation with potential threat to life or bodily function.  Differential diagnosis includes, but is not limited to, stroke, sepsis, UTI, pneumonia, electrolyte abnormality, AKI, pneumonia, viral illness.  Patient chronically ill-appearing but in no acute distress, vital signs are unremarkable.  She is somnolent but arousable to voice, able to follow commands with some encouragement and does not seem to have any focal neurologic deficits.  Given acute change in her mental status, we will check CT head, also check CT cervical spine for any evidence of traumatic injury.  Vital signs are reassuring and do not appear concerning for sepsis, but will check chest x-ray and urinalysis.   EKG shows no evidence of arrhythmia or ischemia, labs are pending at this time.  Sacral ulcer does not appear infected and she has a left heel skin tear that we will wrap with Vaseline gauze.  CT head and cervical spine are negative for acute traumatic injury, do show diffuse bony metastatic disease.  Labs with new anemia but no evidence of bleeding at this time, no significant leukocytosis or electrolyte abnormality, patient does have mild AKI.  She  is much more alert following IV fluid bolus, denies any complaints currently.  2 sets of troponin are elevated but stable, low suspicion for ACS.  CT imaging of her abdomen/pelvis again demonstrates diffuse bony metastatic disease but no other acute finding.  Given her profound weakness, case discussed with hospitalist for admission.      FINAL CLINICAL IMPRESSION(S) / ED DIAGNOSES   Final diagnoses:  Altered mental status, unspecified altered mental status type  Malignant neoplasm metastatic to bone [facility])     Rx / DC Orders   ED Discharge Orders     None        Note:  This document was prepared using Dragon voice recognition software and may include unintentional dictation errors.   Chesley Noon, MD 11/09/22 [phone]

## 2022-11-09 NOTE — ED Notes (Signed)
Vaseline gauze wrapped with gauze to the left heel. Skin approximated. Cleaned with NS.

## 2022-11-09 NOTE — Assessment & Plan Note (Signed)
Registered dietitian has been consulted

## 2022-11-09 NOTE — Assessment & Plan Note (Signed)
Suspect secondary to widespread metastatic disease Primary carcinoma unclear at this time Symptomatic support: Sodium chloride infusion at 100 mL/h, 2 days ordered Registered dietitian has been consulted

## 2022-11-09 NOTE — ED Notes (Signed)
MD at bedside assessing pt. Talking with family at bedside.

## 2022-11-09 NOTE — ED Triage Notes (Addendum)
Pt comes with c/o foot pain. Pt has skin tear to back of left heel. Pt may have scraped it on the walker  this morning.   Pt also have wound to back that family wants looked at bc it might be infected.   Pt sleeping but little arousal. Pt state some pain in chest. Family reports pt has been like this since  this morning.

## 2022-11-09 NOTE — Assessment & Plan Note (Signed)
Secondary to poor p.o. intake Baseline serum creatinine is 0.79-0.81/eGFR greater than 60 Status post LR 1 L bolus per EDP Continue with sodium chloride infusion at 100 mL/h, 2 days ordered Recheck BMP in a.m.

## 2022-11-09 NOTE — Assessment & Plan Note (Addendum)
-   Aspirin 81 mg daily resumed 

## 2022-11-09 NOTE — ED Notes (Signed)
Pt in CT.

## 2022-11-10 ENCOUNTER — Inpatient Hospital Stay: Payer: PPO

## 2022-11-10 DIAGNOSIS — D649 Anemia, unspecified: Secondary | ICD-10-CM

## 2022-11-10 DIAGNOSIS — L8990 Pressure ulcer of unspecified site, unspecified stage: Secondary | ICD-10-CM | POA: Insufficient documentation

## 2022-11-10 DIAGNOSIS — M899 Disorder of bone, unspecified: Secondary | ICD-10-CM

## 2022-11-10 DIAGNOSIS — C7951 Secondary malignant neoplasm of bone: Secondary | ICD-10-CM | POA: Insufficient documentation

## 2022-11-10 DIAGNOSIS — R531 Weakness: Secondary | ICD-10-CM | POA: Diagnosis not present

## 2022-11-10 LAB — CBC
HCT: 30 % — ABNORMAL LOW (ref 36.0–46.0)
Hemoglobin: 8.6 g/dL — ABNORMAL LOW (ref 12.0–15.0)
MCH: 23.3 pg — ABNORMAL LOW (ref 26.0–34.0)
MCHC: 28.7 g/dL — ABNORMAL LOW (ref 30.0–36.0)
MCV: 81.3 fL (ref 80.0–100.0)
Platelets: 217 10*3/uL (ref 150–400)
RBC: 3.69 MIL/uL — ABNORMAL LOW (ref 3.87–5.11)
RDW: 18 % — ABNORMAL HIGH (ref 11.5–15.5)
WBC: 6.7 10*3/uL (ref 4.0–10.5)
nRBC: 0.7 % — ABNORMAL HIGH (ref 0.0–0.2)

## 2022-11-10 LAB — BASIC METABOLIC PANEL
Anion gap: 7 (ref 5–15)
BUN: 36 mg/dL — ABNORMAL HIGH (ref 8–23)
CO2: 20 mmol/L — ABNORMAL LOW (ref 22–32)
Calcium: 8.1 mg/dL — ABNORMAL LOW (ref 8.9–10.3)
Chloride: 116 mmol/L — ABNORMAL HIGH (ref 98–111)
Creatinine, Ser: 0.97 mg/dL (ref 0.44–1.00)
GFR, Estimated: 55 mL/min — ABNORMAL LOW (ref >60)
Glucose, Bld: 64 mg/dL — ABNORMAL LOW (ref 70–99)
Potassium: 4.3 mmol/L (ref 3.5–5.1)
Sodium: 143 mmol/L (ref 135–145)

## 2022-11-10 MED ORDER — ZINC SULFATE 220 (50 ZN) MG PO CAPS
220.0000 mg | ORAL_CAPSULE | Freq: Every day | ORAL | Status: AC
  Administered 2022-11-10 – 2022-11-23 (×13): 220 mg via ORAL
  Filled 2022-11-10 (×14): qty 1

## 2022-11-10 MED ORDER — ENSURE ENLIVE PO LIQD
237.0000 mL | Freq: Two times a day (BID) | ORAL | Status: DC
  Administered 2022-11-10 – 2022-11-18 (×10): 237 mL via ORAL

## 2022-11-10 MED ORDER — VITAMIN C 500 MG PO TABS
500.0000 mg | ORAL_TABLET | Freq: Two times a day (BID) | ORAL | Status: DC
  Administered 2022-11-10 – 2022-12-02 (×45): 500 mg via ORAL
  Filled 2022-11-10 (×45): qty 1

## 2022-11-10 NOTE — Progress Notes (Signed)
Initial Nutrition Assessment  DOCUMENTATION CODES:   Severe malnutrition in context of chronic illness, Underweight  INTERVENTION:   -Ensure Enlive po BID, each supplement provides 350 kcal and 20 grams of protein.  -Magic cup BID with meals, each supplement provides 290 kcal and 9 grams of protein  -MVI with minerals daily -Continue regular diet -500 mg vitamin C BID -220 mg zinc sulfate daily x 14 days   NUTRITION DIAGNOSIS:   Severe Malnutrition related to chronic illness (concern for widespread metastatic disease) as evidenced by moderate muscle depletion, severe muscle depletion, moderate fat depletion, severe fat depletion, percent weight loss.  GOAL:   Patient will meet greater than or equal to 90% of their needs  MONITOR:   PO intake, Supplement acceptance  REASON FOR ASSESSMENT:   Consult Assessment of nutrition requirement/status  ASSESSMENT:   Pt with history of Mobitz type I second-degree heart block, history of TIA, history of hypertension, who presents for chief concerns of worsening generalized weakness.  Pt admitted with weakness (likely related to widespread metastatic disease), lytic bone lesion of hip, and AKI.   Reviewed I/O's: +1.9 L x 24 hours  UOP: 400 ml x 24 hours  Pt lying in bed at time of visit. Pt smiled with this RD when greeted, but unable to provide accurate history. Per RN, pt able to identify that she is in the hospital, but unable to provide further details.    Pt currently on a regular diet. No meal completion data available to assess at this time.   Reviewed wt hx; pt has experienced a 16.4% wt loss over the past 8 months, which is significant for time frame.  Medications reviewed and include vitamin D3, lovenox, and 0.9% sodium chloride infusion @ 75 ml/hr  Labs reviewed.    NUTRITION - FOCUSED PHYSICAL EXAM:  Flowsheet Row Most Recent Value  Orbital Region Moderate depletion  Upper Arm Region Severe depletion  Thoracic  and Lumbar Region Moderate depletion  Buccal Region Severe depletion  Temple Region Severe depletion  Clavicle Bone Region Severe depletion  Clavicle and Acromion Bone Region Severe depletion  Scapular Bone Region Severe depletion  Dorsal Hand Moderate depletion  Patellar Region Severe depletion  Anterior Thigh Region Severe depletion  Posterior Calf Region Severe depletion  Edema (RD Assessment) None  Hair Reviewed  Eyes Reviewed  Mouth Reviewed  Skin Reviewed  Nails Reviewed       Diet Order:   Diet Order             Diet regular Room service appropriate? Yes; Fluid consistency: Thin  Diet effective now                   EDUCATION NEEDS:   No education needs have been identified at this time  Skin:  Skin Assessment: Skin Integrity Issues: Skin Integrity Issues:: Stage III, Other (Comment) Stage III: sacrum, buttocks Other: lt heel laceration  Last BM:  Unknown  Height:   Ht Readings from Last 1 Encounters:  11/09/22 5\' 3"  (1.6 m)    Weight:   Wt Readings from Last 1 Encounters:  11/09/22 37.2 kg    Ideal Body Weight:  52.3 kg  BMI:  Body mass index is 14.53 kg/m.  Estimated Nutritional Needs:   Kcal:  1450-1650  Protein:  60-75 grams  Fluid:  > 1.4 L    Levada Schilling, RD, LDN, CDCES Registered Dietitian II Certified Diabetes Care and Education Specialist Please refer to [facility] for RD and/or  RD on-call/weekend/after hours pager

## 2022-11-10 NOTE — Consult Note (Addendum)
WOC Nurse Consult Note: Reason for Consult: Consult requested for left heel.  Performed remotely after review of bedside nurse's wound flow sheet and progress notes. Pt fell prior to admission and was noted to have a partial thickness skin tear to left heel, 12.5cm in length. Bedside nurse attempted to approximate the skin flap and applied Vaseline gauze.  Topical treatment orders provided for bedside nurses to perform as follows to promote moist healing: Apply Xeroform gauze and 4X4 and kerlex to left heel Q day Dressing procedure/placement/frequency: Apply Xeroform gauze and 4X4 and kerlex to left heel Q day. Please re-consult if further assistance is needed.  Thank-you,  Cammie Mcgee MSN, RN, CWOCN, [place], CNS [phone]

## 2022-11-10 NOTE — Progress Notes (Signed)
PROGRESS NOTE    Cassandra Patterson  HYQ:[id number] DOB: [birth year] DOA: 11/09/2022 PCP: Leanna Sato, MD   Assessment & Plan:   Principal Problem:   Weakness Active Problems:   AKI (acute kidney injury) (HCC)   Lytic bone lesion of hip   Severe protein-calorie malnutrition (HCC)   Dry skin   Essential hypertension   First degree AV block   Mobitz type 1 second degree AV block   TIA (transient ischemic attack)   History of TIA (transient ischemic attack)  Assessment and Plan: Weakness: likely secondary to metastatic cancer, unknown primary. Continue on IVFs.   Lytic bone lesion of hip: w/ metastatic lesions of lumbosacral spine, left ischium, bilateral iliac bones, ribs, pathologic fractures at the inferior left ischial tuberosity, T12, L5 vertebral bodies. Onco consulted   AKI: continue on IVFs  Normocytic anemia: no need for a transfusion currently    Severe protein-calorie malnutrition: nutrition consulted    Dry skin: continue w/ lubriderm lotion    Hxof TIA: continue on aspirin   HTN: continue on home dose of felodipine       DVT prophylaxis: lovenox Code Status: full  Family Communication: discussed pt's care w/ pt's family at bedside and answered their questions  Disposition Plan: depends on PT/OT recs(not consulted yet).  Level of care: Telemetry Medical Status is: Inpatient Remains inpatient appropriate because: severity of illness     Consultants:  Onco   Procedures:   Antimicrobials:    Subjective: Pt c/o fatigue  Objective: Vitals:   11/09/22 1718 11/09/22 2100 11/10/22 0006 11/10/22 0506  BP: (!) 158/71 (!) 115/54 (!) 116/54 (!) 114/53  Pulse: 84 (!) 51 (!) 55 (!) 57  Resp: 17 18 16 20   Temp: 97.9 F (36.6 C) (!) 97.4 F (36.3 C) 98.1 F (36.7 C) 98 F (36.7 C)  TempSrc: Oral     SpO2: 97% 99% 98% 95%  Weight:      Height:        Intake/Output Summary (Last 24 hours) at 11/10/2022 0758 Last data filed at 11/10/2022  9528 Gross per 24 hour  Intake 2307.26 ml  Output --  Net 2307.26 ml   Filed Weights   11/09/22 1053  Weight: 37.2 kg    Examination:  General exam: Appears calm and comfortable. Cachetic  Respiratory system: Clear to auscultation. Respiratory effort normal. Cardiovascular system: S1 & S2 +. No rubs, gallops or clicks.  Gastrointestinal system: Abdomen is nondistended, soft and nontender. Normal bowel sounds heard. Central nervous system: Alert and awake. Moves all extremities  Psychiatry: Judgement and insight appears at baseline. Flat mood and affect     Data Reviewed: I have personally reviewed following labs and imaging studies  CBC: Recent Labs  Lab 11/09/22 1144  WBC 8.6  HGB 9.4*  HCT 32.3*  MCV 80.3  PLT 239   Basic Metabolic Panel: Recent Labs  Lab 11/09/22 1144 11/10/22 0529  NA 142 143  K 4.7 4.3  CL 109 116*  CO2 20* 20*  GLUCOSE 104* 64*  BUN 54* 36*  CREATININE 1.27* 0.97  CALCIUM 8.7* 8.1*   GFR: Estimated Creatinine Clearance: 21.7 mL/min (by C-G formula based on SCr of 0.97 mg/dL). Liver Function Tests: Recent Labs  Lab 11/09/22 1144 11/09/22 1350  AST 61* 61*  ALT 23 24  ALKPHOS 172* 164*  BILITOT 1.1 1.1  PROT 6.3* 6.3*  ALBUMIN 3.1* 2.8*   No results for input(s): "LIPASE", "AMYLASE" in the last 168 hours. No  results for input(s): "AMMONIA" in the last 168 hours. Coagulation Profile: No results for input(s): "INR", "PROTIME" in the last 168 hours. Cardiac Enzymes: No results for input(s): "CKTOTAL", "CKMB", "CKMBINDEX", "TROPONINI" in the last 168 hours. BNP (last 3 results) No results for input(s): "PROBNP" in the last 8760 hours. HbA1C: No results for input(s): "HGBA1C" in the last 72 hours. CBG: No results for input(s): "GLUCAP" in the last 168 hours. Lipid Profile: No results for input(s): "CHOL", "HDL", "LDLCALC", "TRIG", "CHOLHDL", "LDLDIRECT" in the last 72 hours. Thyroid Function Tests: No results for input(s):  "TSH", "T4TOTAL", "FREET4", "T3FREE", "THYROIDAB" in the last 72 hours. Anemia Panel: Recent Labs    11/09/22 1803  FERRITIN 502*  TIBC 305  IRON 52   Sepsis Labs: No results for input(s): "PROCALCITON", "LATICACIDVEN" in the last 168 hours.  No results found for this or any previous visit (from the past 240 hour(s)).       Radiology Studies: CT ABDOMEN PELVIS W CONTRAST  Result Date: 11/09/2022 CLINICAL DATA:  Acute abdominal pain EXAM: CT ABDOMEN AND PELVIS WITH CONTRAST TECHNIQUE: Multidetector CT imaging of the abdomen and pelvis was performed using the standard protocol following bolus administration of intravenous contrast. RADIATION DOSE REDUCTION: This exam was performed according to the departmental dose-optimization program which includes automated exposure control, adjustment of the mA and/or kV according to patient size and/or use of iterative reconstruction technique. CONTRAST:  75mL OMNIPAQUE IOHEXOL 300 MG/ML  SOLN COMPARISON:  None Available. FINDINGS: Lower chest: T9 vertebral body lytic metastasis. Multiple lytic rib metastases. 3.1 cm soft tissue mass left lateral body wall. No pleural or pericardial effusion. Dependent atelectasis in the lung bases. 3.7 cm cystic lesion in the left breast. Hepatobiliary: Subcentimeter calcified stone in the dependent aspect of the nondilated gallbladder. No focal liver lesion or biliary ductal dilatation. Pancreas: Unremarkable. No pancreatic ductal dilatation or surrounding inflammatory changes. Spleen: Normal in size without focal abnormality. Adrenals/Urinary Tract: No adrenal mass. Symmetric renal parenchymal enhancement without focal lesion or evident urolithiasis. Urinary bladder nondistended. Stomach/Bowel: Stomach decompressed. Small bowel nondilated. Normal appendix. The colon is partially distended, without acute finding. Vascular/Lymphatic: Moderate scattered calcified aortoiliac atheromatous plaque without aneurysm or stenosis.  Portal vein patent. No abdominal or pelvic adenopathy. Reproductive: Status post hysterectomy. No adnexal masses. Other: No ascites.  No free air. Musculoskeletal: Multiple lytic bone lesions throughout the lumbosacral spine, left ischium, bilateral iliac bones. Lytic lesion in the inferior left ischial tuberosity with pathologic fracture, minimally displaced. Pathologic compression deformity of L5 with a greater than 50% loss of height. Mild compression deformity of T12. fixation hardware in the posterior left acetabulum. IMPRESSION: 1. Multiple lytic bone lesions throughout the lumbosacral spine, left ischium, bilateral iliac bones, and ribs. Pathologic fractures of the inferior left ischial tuberosity, T12 and L5 vertebral bodies. 2. 3.1 cm soft tissue mass left lateral body wall. 3. 3.7 cm cystic lesion in the left breast. 4. Cholelithiasis. 5.  Aortic Atherosclerosis (ICD10-I70.0). Electronically Signed   By: Corlis Leak M.D.   On: 11/09/2022 13:53   CT Head Wo Contrast  Result Date: 11/09/2022 CLINICAL DATA:  Mental status change with unknown cause. Neck trauma EXAM: CT HEAD WITHOUT CONTRAST CT CERVICAL SPINE WITHOUT CONTRAST TECHNIQUE: Multidetector CT imaging of the head and cervical spine was performed following the standard protocol without intravenous contrast. Multiplanar CT image reconstructions of the cervical spine were also generated. RADIATION DOSE REDUCTION: This exam was performed according to the departmental dose-optimization program which includes automated exposure control,  adjustment of the mA and/or kV according to patient size and/or use of iterative reconstruction technique. COMPARISON:  Brain MRI 01/07/2021 FINDINGS: CT HEAD FINDINGS Brain: No evidence of acute infarction, hemorrhage, hydrocephalus, extra-axial collection or mass lesion/mass effect. Small right cerebellar infarct, chronic by prior MRI. Generalized brain atrophy. Vascular: No hyperdense vessel or unexpected  calcification. Skull: Multiple lytic lesions scattered throughout the calvarium. Parasagittal right frontal and parasagittal left parietal deposits erode the inner table. Additional notable lesion in the left sphenoid wing eroding the inner table. Sinuses/Orbits: No acute finding CT CERVICAL SPINE FINDINGS Alignment: Normal Skull base and vertebrae: No acute fracture. Multiple lytic bone lesions seen in the C3, C6, C7, T1, T2, and T3 bodies. Notable posterior element deposits at T1-T4. Partially covered nonacute right clavicle fracture with bulky callus that could obscure underlying lesion. Soft tissues and spinal canal: No prevertebral fluid or swelling. No visible canal hematoma. Disc levels:  Ordinary degenerative spurring. Upper chest: Sizable metastatic deposit at the right second rib posteriorly. IMPRESSION: Widespread osseous metastatic disease including deposits that erodes through the inner table. No acute intracranial finding.  No visible brain metastasis. Electronically Signed   By: Tiburcio Pea M.D.   On: 11/09/2022 11:45   CT Cervical Spine Wo Contrast  Result Date: 11/09/2022 CLINICAL DATA:  Mental status change with unknown cause. Neck trauma EXAM: CT HEAD WITHOUT CONTRAST CT CERVICAL SPINE WITHOUT CONTRAST TECHNIQUE: Multidetector CT imaging of the head and cervical spine was performed following the standard protocol without intravenous contrast. Multiplanar CT image reconstructions of the cervical spine were also generated. RADIATION DOSE REDUCTION: This exam was performed according to the departmental dose-optimization program which includes automated exposure control, adjustment of the mA and/or kV according to patient size and/or use of iterative reconstruction technique. COMPARISON:  Brain MRI 01/07/2021 FINDINGS: CT HEAD FINDINGS Brain: No evidence of acute infarction, hemorrhage, hydrocephalus, extra-axial collection or mass lesion/mass effect. Small right cerebellar infarct, chronic by  prior MRI. Generalized brain atrophy. Vascular: No hyperdense vessel or unexpected calcification. Skull: Multiple lytic lesions scattered throughout the calvarium. Parasagittal right frontal and parasagittal left parietal deposits erode the inner table. Additional notable lesion in the left sphenoid wing eroding the inner table. Sinuses/Orbits: No acute finding CT CERVICAL SPINE FINDINGS Alignment: Normal Skull base and vertebrae: No acute fracture. Multiple lytic bone lesions seen in the C3, C6, C7, T1, T2, and T3 bodies. Notable posterior element deposits at T1-T4. Partially covered nonacute right clavicle fracture with bulky callus that could obscure underlying lesion. Soft tissues and spinal canal: No prevertebral fluid or swelling. No visible canal hematoma. Disc levels:  Ordinary degenerative spurring. Upper chest: Sizable metastatic deposit at the right second rib posteriorly. IMPRESSION: Widespread osseous metastatic disease including deposits that erodes through the inner table. No acute intracranial finding.  No visible brain metastasis. Electronically Signed   By: Tiburcio Pea M.D.   On: 11/09/2022 11:45   DG Chest 2 View  Result Date: 11/09/2022 CLINICAL DATA:  Chest pain.  Back and foot wounds. EXAM: CHEST - 2 VIEW COMPARISON:  01/06/2021 FINDINGS: Moderate cardiomegaly again noted. Ectasia and atherosclerotic calcification of the thoracic aorta again seen. Both lungs remain clear. No evidence of pneumothorax or pleural effusion. Several displaced left lateral rib fracture deformities are seen several displaced rib fractures are seen involving the left lateral 4th through 9th ribs, at least 1 of which shows periosteal new bone formation. IMPRESSION: Moderate cardiomegaly. No active lung disease. Multiple left lateral rib fractures, at least 1  of which shows periosteal new bone formation. Electronically Signed   By: Danae Orleans M.D.   On: 11/09/2022 11:16        Scheduled Meds:  aspirin EC   81 mg Oral Daily   brimonidine  1 drop Both Eyes BID   calcitonin (salmon)  1 spray Alternating Nares Daily   cholecalciferol  1,000 Units Oral Daily   enoxaparin (LOVENOX) injection  30 mg Subcutaneous QHS   felodipine  5 mg Oral Daily   hydrocerin  1 Application Topical BID   latanoprost  1 drop Left Eye QHS   Latanoprostene Bunod  1 drop Ophthalmic QHS   multivitamin with minerals  1 tablet Oral Daily   Continuous Infusions:  sodium chloride 100 mL/hr at 11/09/22 1732     LOS: 1 day        Charise Killian, MD [facility] Pager [phone]  If 7PM-7AM, please contact night-coverage [email or web address] 11/10/2022, 7:58 AM

## 2022-11-10 NOTE — Consult Note (Signed)
Hematology/Oncology Consult note [phone] [phone]      Patient Care Team: Leanna Sato, MD as PCP - General (Family Medicine) End, Cristal Deer, MD as PCP - Cardiology (Cardiology)   Name of the patient: Cassandra Patterson  [id number]  [birth year]   REASON FOR COSULTATION:   History of presenting illness-  89 y.o. female with PMH listed at below who presents to ER for evaluation of generalized weakness.  Her workup images including CT head without contrast, CT cervical spine without contrast, CT abdomen pelvis with contrast showed multiple osseous metastatic bone lesions.  Pathological fracture inferior left ischial tuberosity, T12, L5 vertebral bodies.  3.7 cm cystic lesion in the left breast.  3.1 cm soft tissue mass on the left lateral body wall.  Oncology was consulted for further evaluation and management. Multiple family members at the bedside, including 2 sisters and niece. Patient lives by herself, her sisters help her with house chores.  Per family, patient used to be able to walk with her walker until recently.    No Known Allergies  Patient Active Problem List   Diagnosis Date Noted   Weakness 11/09/2022   Severe protein-calorie malnutrition (HCC) 11/09/2022   AKI (acute kidney injury) (HCC) 11/09/2022   Lytic bone lesion of hip 11/09/2022   Dry skin 11/09/2022   History of TIA (transient ischemic attack) 05/01/2021   TIA (transient ischemic attack) 01/06/2021   Mobitz type 1 second degree AV block 02/22/2020   Palpitations 01/26/2020   Heart murmur 01/26/2020   Essential hypertension 01/26/2020   Bradycardia 01/26/2020   First degree AV block 01/26/2020     Past Medical History:  Diagnosis Date   Cataracts, bilateral    Glaucoma    Hypertension    Mobitz type 1 second degree atrioventricular block    TIA (transient ischemic attack)      Past Surgical History:  Procedure Laterality Date   CATARACT EXTRACTION       Social History   Socioeconomic History   Marital status: Married    Spouse name: Not on file   Number of children: Not on file   Years of education: Not on file   Highest education level: Not on file  Occupational History   Not on file  Tobacco Use   Smoking status: Never   Smokeless tobacco: Never  Vaping Use   Vaping status: Never Used  Substance and Sexual Activity   Alcohol use: No   Drug use: No   Sexual activity: Not Currently  Other Topics Concern   Not on file  Social History Narrative   Not on file   Social Determinants of Health   Financial Resource Strain: Not on file  Food Insecurity: Patient Unable To Answer (11/09/2022)   Hunger Vital Sign    Worried About Running Out of Food in the Last Year: Patient unable to answer    Ran Out of Food in the Last Year: Patient unable to answer  Transportation Needs: Patient Unable To Answer (11/09/2022)   PRAPARE - Transportation    Lack of Transportation (Medical): Patient unable to answer    Lack of Transportation (Non-Medical): Patient unable to answer  Physical Activity: Not on file  Stress: Not on file  Social Connections: Not on file  Intimate Partner Violence: Patient Unable To Answer (11/09/2022)   Humiliation, Afraid, Rape, and Kick questionnaire    Fear of Current or Ex-Partner: Patient unable to answer    Emotionally Abused: Patient unable to answer  Physically Abused: Patient unable to answer    Sexually Abused: Patient unable to answer     Family History  Problem Relation Age of Onset   Hypertension Mother    Heart disease Neg Hx      Current Facility-Administered Medications:    0.9 %  sodium chloride infusion, , Intravenous, Continuous, Charise Killian, MD, Last Rate: 75 mL/hr at 11/10/22 1544, New Bag at 11/10/22 1544   acetaminophen (TYLENOL) tablet 650 mg, 650 mg, Oral, Q6H PRN **OR** acetaminophen (TYLENOL) suppository 650 mg, 650 mg, Rectal, Q6H PRN, Cox, Amy N, DO   ascorbic acid  (VITAMIN C) tablet 500 mg, 500 mg, Oral, BID, Charise Killian, MD, 500 mg at 11/10/22 1542   aspirin EC tablet 81 mg, 81 mg, Oral, Daily, Cox, Amy N, DO, 81 mg at 11/10/22 1046   brimonidine (ALPHAGAN) 0.15 % ophthalmic solution 1 drop, 1 drop, Both Eyes, BID, Cox, Amy N, DO, 1 drop at 11/10/22 1031   calcitonin (salmon) (MIACALCIN/FORTICAL) nasal spray 1 spray, 1 spray, Alternating Nares, Daily, Cox, Amy N, DO, 1 spray at 11/10/22 1031   cholecalciferol (VITAMIN D3) tablet 1,000 Units, 1,000 Units, Oral, Daily, Cox, Amy N, DO, 1,000 Units at 11/10/22 1046   enoxaparin (LOVENOX) injection 30 mg, 30 mg, Subcutaneous, QHS, Cox, Amy N, DO, 30 mg at 11/09/22 2036   feeding supplement (ENSURE ENLIVE / ENSURE PLUS) liquid 237 mL, 237 mL, Oral, BID BM, Charise Killian, MD, 237 mL at 11/10/22 1542   felodipine (PLENDIL) 24 hr tablet 5 mg, 5 mg, Oral, Daily, Cox, Amy N, DO, 5 mg at 11/09/22 1742   hydrALAZINE (APRESOLINE) injection 5 mg, 5 mg, Intravenous, Q8H PRN, Cox, Amy N, DO   hydrocerin (EUCERIN) cream 1 Application, 1 Application, Topical, BID, Cox, Amy N, DO, 1 Application at 11/10/22 1030   latanoprost (XALATAN) 0.005 % ophthalmic solution 1 drop, 1 drop, Left Eye, QHS, Cox, Amy N, DO, 1 drop at 11/09/22 2038   Latanoprostene Bunod 0.024 % SOLN 1 drop, 1 drop, Ophthalmic, QHS, Cox, Amy N, DO   multivitamin with minerals tablet 1 tablet, 1 tablet, Oral, Daily, Cox, Amy N, DO, 1 tablet at 11/10/22 1046   ondansetron (ZOFRAN) tablet 4 mg, 4 mg, Oral, Q6H PRN **OR** ondansetron (ZOFRAN) injection 4 mg, 4 mg, Intravenous, Q6H PRN, Cox, Amy N, DO   senna-docusate (Senokot-S) tablet 1 tablet, 1 tablet, Oral, QHS PRN, Cox, Amy N, DO   zinc sulfate capsule 220 mg, 220 mg, Oral, Daily, Charise Killian, MD, 220 mg at 11/10/22 1542  Review of Systems  Constitutional:  Positive for fatigue. Negative for appetite change, chills and fever.  HENT:   Negative for hearing loss and voice change.   Eyes:   Negative for eye problems.  Respiratory:  Negative for chest tightness and cough.   Cardiovascular:  Negative for chest pain.  Gastrointestinal:  Negative for abdominal distention, abdominal pain and blood in stool.  Endocrine: Negative for hot flashes.  Genitourinary:  Negative for difficulty urinating and frequency.   Musculoskeletal:  Positive for arthralgias and back pain.  Skin:  Negative for itching and rash.  Neurological:  Negative for extremity weakness.  Hematological:  Negative for adenopathy.  Psychiatric/Behavioral:  Negative for confusion.     PHYSICAL EXAM Vitals:   11/10/22 0807 11/10/22 1223 11/10/22 1629 11/10/22 2049  BP: (!) 117/54 129/64 125/62 127/68  Pulse: (!) 56 (!) 50 (!) 54 64  Resp: 14 16 18  18  Temp: 98.1 F (36.7 C) 97.9 F (36.6 C) (!) 97.5 F (36.4 C) 98.4 F (36.9 C)  TempSrc:  Oral    SpO2: 99% 96%  97%  Weight:      Height:       Physical Exam Constitutional:      General: She is not in acute distress.    Appearance: She is not diaphoretic.  HENT:     Head: Normocephalic and atraumatic.  Eyes:     General: No scleral icterus. Cardiovascular:     Rate and Rhythm: Normal rate and regular rhythm.  Pulmonary:     Effort: Pulmonary effort is normal. No respiratory distress.  Abdominal:     General: Bowel sounds are normal.     Palpations: Abdomen is soft.  Musculoskeletal:        General: Normal range of motion.     Cervical back: Normal range of motion and neck supple.  Skin:    General: Skin is warm and dry.     Findings: No erythema.  Neurological:     Mental Status: She is alert. Mental status is at baseline.     Motor: No abnormal muscle tone.     Comments: Orientated x 2  Psychiatric:        Mood and Affect: Affect normal.   Palpable left breast mass /lateral body wall mass  LABORATORY STUDIES    Latest Ref Rng & Units 11/10/2022    7:16 AM 11/09/2022   11:44 AM 03/26/2022   10:54 AM  CBC  WBC 4.0 - 10.5 K/uL 6.7  8.6   5.3   Hemoglobin 12.0 - 15.0 g/dL 8.6  9.4  40.9   Hematocrit 36.0 - 46.0 % 30.0  32.3  41.1   Platelets 150 - 400 K/uL 217  239  270       Latest Ref Rng & Units 11/10/2022    5:29 AM 11/09/2022    1:50 PM 11/09/2022   11:44 AM  CMP  Glucose 70 - 99 mg/dL 64   811   BUN 8 - 23 mg/dL 36   54   Creatinine 9.14 - 1.00 mg/dL 7.82   9.56   Sodium 213 - 145 mmol/L 143   142   Potassium 3.5 - 5.1 mmol/L 4.3   4.7   Chloride 98 - 111 mmol/L 116   109   CO2 22 - 32 mmol/L 20   20   Calcium 8.9 - 10.3 mg/dL 8.1   8.7   Total Protein 6.5 - 8.1 g/dL  6.3  6.3   Total Bilirubin 0.3 - 1.2 mg/dL  1.1  1.1   Alkaline Phos 38 - 126 U/L  164  172   AST 15 - 41 U/L  61  61   ALT 0 - 44 U/L  24  23      RADIOGRAPHIC STUDIES: I have personally reviewed the radiological images as listed and agreed with the findings in the report. CT ABDOMEN PELVIS W CONTRAST  Result Date: 11/09/2022 CLINICAL DATA:  Acute abdominal pain EXAM: CT ABDOMEN AND PELVIS WITH CONTRAST TECHNIQUE: Multidetector CT imaging of the abdomen and pelvis was performed using the standard protocol following bolus administration of intravenous contrast. RADIATION DOSE REDUCTION: This exam was performed according to the departmental dose-optimization program which includes automated exposure control, adjustment of the mA and/or kV according to patient size and/or use of iterative reconstruction technique. CONTRAST:  75mL OMNIPAQUE IOHEXOL 300 MG/ML  SOLN COMPARISON:  None  Available. FINDINGS: Lower chest: T9 vertebral body lytic metastasis. Multiple lytic rib metastases. 3.1 cm soft tissue mass left lateral body wall. No pleural or pericardial effusion. Dependent atelectasis in the lung bases. 3.7 cm cystic lesion in the left breast. Hepatobiliary: Subcentimeter calcified stone in the dependent aspect of the nondilated gallbladder. No focal liver lesion or biliary ductal dilatation. Pancreas: Unremarkable. No pancreatic ductal dilatation or  surrounding inflammatory changes. Spleen: Normal in size without focal abnormality. Adrenals/Urinary Tract: No adrenal mass. Symmetric renal parenchymal enhancement without focal lesion or evident urolithiasis. Urinary bladder nondistended. Stomach/Bowel: Stomach decompressed. Small bowel nondilated. Normal appendix. The colon is partially distended, without acute finding. Vascular/Lymphatic: Moderate scattered calcified aortoiliac atheromatous plaque without aneurysm or stenosis. Portal vein patent. No abdominal or pelvic adenopathy. Reproductive: Status post hysterectomy. No adnexal masses. Other: No ascites.  No free air. Musculoskeletal: Multiple lytic bone lesions throughout the lumbosacral spine, left ischium, bilateral iliac bones. Lytic lesion in the inferior left ischial tuberosity with pathologic fracture, minimally displaced. Pathologic compression deformity of L5 with a greater than 50% loss of height. Mild compression deformity of T12. fixation hardware in the posterior left acetabulum. IMPRESSION: 1. Multiple lytic bone lesions throughout the lumbosacral spine, left ischium, bilateral iliac bones, and ribs. Pathologic fractures of the inferior left ischial tuberosity, T12 and L5 vertebral bodies. 2. 3.1 cm soft tissue mass left lateral body wall. 3. 3.7 cm cystic lesion in the left breast. 4. Cholelithiasis. 5.  Aortic Atherosclerosis (ICD10-I70.0). Electronically Signed   By: Corlis Leak M.D.   On: 11/09/2022 13:53   CT Head Wo Contrast  Result Date: 11/09/2022 CLINICAL DATA:  Mental status change with unknown cause. Neck trauma EXAM: CT HEAD WITHOUT CONTRAST CT CERVICAL SPINE WITHOUT CONTRAST TECHNIQUE: Multidetector CT imaging of the head and cervical spine was performed following the standard protocol without intravenous contrast. Multiplanar CT image reconstructions of the cervical spine were also generated. RADIATION DOSE REDUCTION: This exam was performed according to the departmental  dose-optimization program which includes automated exposure control, adjustment of the mA and/or kV according to patient size and/or use of iterative reconstruction technique. COMPARISON:  Brain MRI 01/07/2021 FINDINGS: CT HEAD FINDINGS Brain: No evidence of acute infarction, hemorrhage, hydrocephalus, extra-axial collection or mass lesion/mass effect. Small right cerebellar infarct, chronic by prior MRI. Generalized brain atrophy. Vascular: No hyperdense vessel or unexpected calcification. Skull: Multiple lytic lesions scattered throughout the calvarium. Parasagittal right frontal and parasagittal left parietal deposits erode the inner table. Additional notable lesion in the left sphenoid wing eroding the inner table. Sinuses/Orbits: No acute finding CT CERVICAL SPINE FINDINGS Alignment: Normal Skull base and vertebrae: No acute fracture. Multiple lytic bone lesions seen in the C3, C6, C7, T1, T2, and T3 bodies. Notable posterior element deposits at T1-T4. Partially covered nonacute right clavicle fracture with bulky callus that could obscure underlying lesion. Soft tissues and spinal canal: No prevertebral fluid or swelling. No visible canal hematoma. Disc levels:  Ordinary degenerative spurring. Upper chest: Sizable metastatic deposit at the right second rib posteriorly. IMPRESSION: Widespread osseous metastatic disease including deposits that erodes through the inner table. No acute intracranial finding.  No visible brain metastasis. Electronically Signed   By: Tiburcio Pea M.D.   On: 11/09/2022 11:45   CT Cervical Spine Wo Contrast  Result Date: 11/09/2022 CLINICAL DATA:  Mental status change with unknown cause. Neck trauma EXAM: CT HEAD WITHOUT CONTRAST CT CERVICAL SPINE WITHOUT CONTRAST TECHNIQUE: Multidetector CT imaging of the head and cervical spine was performed  following the standard protocol without intravenous contrast. Multiplanar CT image reconstructions of the cervical spine were also  generated. RADIATION DOSE REDUCTION: This exam was performed according to the departmental dose-optimization program which includes automated exposure control, adjustment of the mA and/or kV according to patient size and/or use of iterative reconstruction technique. COMPARISON:  Brain MRI 01/07/2021 FINDINGS: CT HEAD FINDINGS Brain: No evidence of acute infarction, hemorrhage, hydrocephalus, extra-axial collection or mass lesion/mass effect. Small right cerebellar infarct, chronic by prior MRI. Generalized brain atrophy. Vascular: No hyperdense vessel or unexpected calcification. Skull: Multiple lytic lesions scattered throughout the calvarium. Parasagittal right frontal and parasagittal left parietal deposits erode the inner table. Additional notable lesion in the left sphenoid wing eroding the inner table. Sinuses/Orbits: No acute finding CT CERVICAL SPINE FINDINGS Alignment: Normal Skull base and vertebrae: No acute fracture. Multiple lytic bone lesions seen in the C3, C6, C7, T1, T2, and T3 bodies. Notable posterior element deposits at T1-T4. Partially covered nonacute right clavicle fracture with bulky callus that could obscure underlying lesion. Soft tissues and spinal canal: No prevertebral fluid or swelling. No visible canal hematoma. Disc levels:  Ordinary degenerative spurring. Upper chest: Sizable metastatic deposit at the right second rib posteriorly. IMPRESSION: Widespread osseous metastatic disease including deposits that erodes through the inner table. No acute intracranial finding.  No visible brain metastasis. Electronically Signed   By: Tiburcio Pea M.D.   On: 11/09/2022 11:45   DG Chest 2 View  Result Date: 11/09/2022 CLINICAL DATA:  Chest pain.  Back and foot wounds. EXAM: CHEST - 2 VIEW COMPARISON:  01/06/2021 FINDINGS: Moderate cardiomegaly again noted. Ectasia and atherosclerotic calcification of the thoracic aorta again seen. Both lungs remain clear. No evidence of pneumothorax or  pleural effusion. Several displaced left lateral rib fracture deformities are seen several displaced rib fractures are seen involving the left lateral 4th through 9th ribs, at least 1 of which shows periosteal new bone formation. IMPRESSION: Moderate cardiomegaly. No active lung disease. Multiple left lateral rib fractures, at least 1 of which shows periosteal new bone formation. Electronically Signed   By: Danae Orleans M.D.   On: 11/09/2022 11:16     Assessment and plan-   # Multiple osseous bone lesions, suspicious for metastatic disease. Differential includes metastatic lesions from solid organ cancers, i.e. breast cancer versus multiple myeloma. I have sent off protein electrophoresis, light chain ratios. Physical examination is significant for a palpable large left breast mass with possible axillary lymph nodes, lateral wall soft tissue. I will check a chest CT without contrast for further evaluation. Need tissue biopsy to establish diagnosis. Family members have multiple questions.   All questions were answered to patient's family members satisfaction.  # AKI, creatinine has improved. # Normocytic anemia Iron panel is not consistent with iron deficiency.  Check B12 and folate,   Thank you for allowing me to participate in the care of this patient.   Rickard Patience, MD, PhD Hematology Oncology 11/10/2022

## 2022-11-11 ENCOUNTER — Inpatient Hospital Stay: Payer: PPO

## 2022-11-11 ENCOUNTER — Other Ambulatory Visit: Payer: Self-pay | Admitting: Internal Medicine

## 2022-11-11 DIAGNOSIS — R531 Weakness: Secondary | ICD-10-CM | POA: Diagnosis not present

## 2022-11-11 DIAGNOSIS — M899 Disorder of bone, unspecified: Secondary | ICD-10-CM | POA: Diagnosis not present

## 2022-11-11 LAB — COMPREHENSIVE METABOLIC PANEL
ALT: 18 U/L (ref 0–44)
AST: 35 U/L (ref 15–41)
Albumin: 2.2 g/dL — ABNORMAL LOW (ref 3.5–5.0)
Alkaline Phosphatase: 135 U/L — ABNORMAL HIGH (ref 38–126)
Anion gap: 5 (ref 5–15)
BUN: 30 mg/dL — ABNORMAL HIGH (ref 8–23)
CO2: 19 mmol/L — ABNORMAL LOW (ref 22–32)
Calcium: 7.5 mg/dL — ABNORMAL LOW (ref 8.9–10.3)
Chloride: 112 mmol/L — ABNORMAL HIGH (ref 98–111)
Creatinine, Ser: 0.91 mg/dL (ref 0.44–1.00)
GFR, Estimated: 59 mL/min — ABNORMAL LOW (ref >60)
Glucose, Bld: 328 mg/dL — ABNORMAL HIGH (ref 70–99)
Potassium: 3.9 mmol/L (ref 3.5–5.1)
Sodium: 136 mmol/L (ref 135–145)
Total Bilirubin: 0.8 mg/dL (ref 0.3–1.2)
Total Protein: 4.8 g/dL — ABNORMAL LOW (ref 6.5–8.1)

## 2022-11-11 LAB — CBC
HCT: 25.2 % — ABNORMAL LOW (ref 36.0–46.0)
Hemoglobin: 7.3 g/dL — ABNORMAL LOW (ref 12.0–15.0)
MCH: 23.3 pg — ABNORMAL LOW (ref 26.0–34.0)
MCHC: 29 g/dL — ABNORMAL LOW (ref 30.0–36.0)
MCV: 80.5 fL (ref 80.0–100.0)
Platelets: 203 10*3/uL (ref 150–400)
RBC: 3.13 MIL/uL — ABNORMAL LOW (ref 3.87–5.11)
RDW: 17.6 % — ABNORMAL HIGH (ref 11.5–15.5)
WBC: 5.4 10*3/uL (ref 4.0–10.5)
nRBC: 0.7 % — ABNORMAL HIGH (ref 0.0–0.2)

## 2022-11-11 LAB — GLUCOSE, CAPILLARY
Glucose-Capillary: 67 mg/dL — ABNORMAL LOW (ref 70–99)
Glucose-Capillary: 215 mg/dL — ABNORMAL HIGH (ref 70–99)

## 2022-11-11 LAB — KAPPA/LAMBDA LIGHT CHAINS
Kappa free light chain: 39.8 mg/L — ABNORMAL HIGH (ref 3.3–19.4)
Kappa, lambda light chain ratio: 1.01 (ref 0.26–1.65)
Lambda free light chains: 39.4 mg/L — ABNORMAL HIGH (ref 5.7–26.3)

## 2022-11-11 MED ORDER — SODIUM BICARBONATE 650 MG PO TABS
650.0000 mg | ORAL_TABLET | Freq: Two times a day (BID) | ORAL | Status: DC
  Administered 2022-11-11 – 2022-11-28 (×35): 650 mg via ORAL
  Filled 2022-11-11 (×36): qty 1

## 2022-11-11 MED ORDER — DEXTROSE 50 % IV SOLN
1.0000 | Freq: Once | INTRAVENOUS | Status: AC
  Administered 2022-11-11: 50 mL via INTRAVENOUS
  Filled 2022-11-11: qty 50

## 2022-11-11 MED ORDER — GADOBUTROL 1 MMOL/ML IV SOLN
4.0000 mL | Freq: Once | INTRAVENOUS | Status: AC | PRN
  Administered 2022-11-11: 4 mL via INTRAVENOUS

## 2022-11-11 NOTE — Progress Notes (Addendum)
PROGRESS NOTE   HPI was taken from Dr. Sedalia Muta: [name] is a 89 year old female with history of Mobitz type I second-degree heart block, history of TIA, history of hypertension, who presents to the emergency department for chief concerns of worsening generalized weakness.   Vitals in the ED showed temperature of 98, respiration rate 20, heart rate of 85, blood pressure 99/85, improved to 126/72, SpO2 99% on room air.   Serum sodium is  142, potassium 4.3, chloride 109, bicarb 20, BUN of 54, serum creatinine 1.27, EGFR 40, nonfasting blood glucose 104, WBC 8.6, hemoglobin 9.6, platelets of 239.   Chest x-ray 2 view: Moderate cardiomegaly.  No active lung disease.  Multiple left lateral rib fractures, at least 1 shows periosteal new bone formation.   CT head wo contrast and CT cervical spine wo contrast: Widespread osseous metastatic disease including deposits that erodes through the inner table.  No acute intracranial finding.  No visible brain metastasis.   CT abdomen pelvis w contrast: Multiple lytic bone lesions throughout lumbosacral spine, left ischium, bilateral iliac bones, and ribs.  Pathologic fracture inferior left ischial tuberosity, T12, L5 vertebral bodies.  3.1 cm soft tissue mass of the left lateral body wall.  3.7 cm cystic lesion in the left breast.  Cholelithiasis.  Aortic atherosclerosis.   ED treatment: LR 1 L bolus. ---------------------------- At bedside, patient patient was able to tell me her name, she states that she is 89 years old.  She knows the current calendar year and she knows she is in the hospital.   She notes her 2 sisters are at bedside.  She reports generalized weakness, is unable to tell me for how long this has been ongoing.  She denies being in pain currently. She endorses weight loss however denies lack of appetite.  Family states that she eats well.   Cassandra Patterson  ZOX:[id number] DOB: [birth year] DOA: 11/09/2022 PCP: Leanna Sato,  MD   Assessment & Plan:   Principal Problem:   Weakness Active Problems:   AKI (acute kidney injury) (HCC)   Lytic bone lesion of hip   Severe protein-calorie malnutrition (HCC)   Dry skin   Essential hypertension   First degree AV block   Mobitz type 1 second degree AV block   TIA (transient ischemic attack)   History of TIA (transient ischemic attack)   Malignant neoplasm metastatic to bone (HCC)   Normocytic anemia   Pressure injury of skin  Assessment and Plan: Weakness: likely secondary to metastatic cancer, unknown primary.   Lytic bone lesion of hip: w/ metastatic lesions of lumbosacral spine, left ischium, bilateral iliac bones, ribs, pathologic fractures at the inferior left ischial tuberosity, T12, L5 vertebral bodies. Unknown primary. Large palpable breast mass as per onco. CT abd/pelvis shows 3.1 cm soft tissue mass left lateral body wall, 3.7 cm cystic lesion in the left breast, cholelithiasis. CT chest shows multiple lytic bone metastases in T spine, ribs, 3.1 cm soft tissue lesion in T9 vertebral body destroys right pedicle  w/ tumor extension into the spinal cord with multiple other findings, see CT impression.  Need tissue biopsy for dx & onco ordered CT guided T9 soft tissue mass. MRI T-spine WWO ordered. Protein electrophoresis, light chain ratios pending as per onco. Onco following and recs apprec.    AKI: resolved  Normocytic anemia: H&H is trending down. Will transfuse if Hb < 7.0    Severe protein-calorie malnutrition: nutrition consulted    Dry skin: continue w/  lubriderm lotion    Hxof TIA: continue on aspirin   HTN: continue on home dose of felodipine       DVT prophylaxis: lovenox Code Status: full  Family Communication: discussed pt's care w/ pt's family at bedside and answered their questions  Disposition Plan: depends on PT/OT recs(not consulted yet).  Level of care: Telemetry Medical Status is: Inpatient Remains inpatient appropriate  because: severity of illness     Consultants:  Onco   Procedures:   Antimicrobials:    Subjective: Pt c/o malaise   Objective: Vitals:   11/10/22 1629 11/10/22 2049 11/11/22 0109 11/11/22 0429  BP: 125/62 127/68 128/62 135/66  Pulse: (!) 54 64 (!) 54 (!) 58  Resp: 18 18 20 18   Temp: (!) 97.5 F (36.4 C) 98.4 F (36.9 C) 98.4 F (36.9 C) 99.1 F (37.3 C)  TempSrc:    Oral  SpO2:  97% 98% 97%  Weight:      Height:        Intake/Output Summary (Last 24 hours) at 11/11/2022 0814 Last data filed at 11/10/2022 1906 Gross per 24 hour  Intake 802.53 ml  Output --  Net 802.53 ml   Filed Weights   11/09/22 1053  Weight: 37.2 kg    Examination:  General exam: appears comfortable. Cachetic  Respiratory system: clear breath sounds b/l  Cardiovascular system: S1/S2+. No rubs or clicks  Gastrointestinal system: abd is soft, NT, ND & hypoactive bowel sounds  Central nervous system:  Lethargic  Psychiatry: Judgement and insight appears poor. Flat mood and affect    Data Reviewed: I have personally reviewed following labs and imaging studies  CBC: Recent Labs  Lab 11/09/22 1144 11/10/22 0716 11/11/22 0510  WBC 8.6 6.7 5.4  HGB 9.4* 8.6* 7.3*  HCT 32.3* 30.0* 25.2*  MCV 80.3 81.3 80.5  PLT 239 217 203   Basic Metabolic Panel: Recent Labs  Lab 11/09/22 1144 11/10/22 0529 11/11/22 0510  NA 142 143 136  K 4.7 4.3 3.9  CL 109 116* 112*  CO2 20* 20* 19*  GLUCOSE 104* 64* 328*  BUN 54* 36* 30*  CREATININE 1.27* 0.97 0.91  CALCIUM 8.7* 8.1* 7.5*   GFR: Estimated Creatinine Clearance: 23.2 mL/min (by C-G formula based on SCr of 0.91 mg/dL). Liver Function Tests: Recent Labs  Lab 11/09/22 1144 11/09/22 1350 11/11/22 0510  AST 61* 61* 35  ALT 23 24 18   ALKPHOS 172* 164* 135*  BILITOT 1.1 1.1 0.8  PROT 6.3* 6.3* 4.8*  ALBUMIN 3.1* 2.8* 2.2*   No results for input(s): "LIPASE", "AMYLASE" in the last 168 hours. No results for input(s): "AMMONIA" in  the last 168 hours. Coagulation Profile: No results for input(s): "INR", "PROTIME" in the last 168 hours. Cardiac Enzymes: No results for input(s): "CKTOTAL", "CKMB", "CKMBINDEX", "TROPONINI" in the last 168 hours. BNP (last 3 results) No results for input(s): "PROBNP" in the last 8760 hours. HbA1C: No results for input(s): "HGBA1C" in the last 72 hours. CBG: Recent Labs  Lab 11/11/22 0429 11/11/22 0521  GLUCAP 67* 215*   Lipid Profile: No results for input(s): "CHOL", "HDL", "LDLCALC", "TRIG", "CHOLHDL", "LDLDIRECT" in the last 72 hours. Thyroid Function Tests: No results for input(s): "TSH", "T4TOTAL", "FREET4", "T3FREE", "THYROIDAB" in the last 72 hours. Anemia Panel: Recent Labs    11/09/22 1803  FERRITIN 502*  TIBC 305  IRON 52   Sepsis Labs: No results for input(s): "PROCALCITON", "LATICACIDVEN" in the last 168 hours.  No results found for this or  any previous visit (from the past 240 hour(s)).       Radiology Studies: CT ABDOMEN PELVIS W CONTRAST  Result Date: 11/09/2022 CLINICAL DATA:  Acute abdominal pain EXAM: CT ABDOMEN AND PELVIS WITH CONTRAST TECHNIQUE: Multidetector CT imaging of the abdomen and pelvis was performed using the standard protocol following bolus administration of intravenous contrast. RADIATION DOSE REDUCTION: This exam was performed according to the departmental dose-optimization program which includes automated exposure control, adjustment of the mA and/or kV according to patient size and/or use of iterative reconstruction technique. CONTRAST:  75mL OMNIPAQUE IOHEXOL 300 MG/ML  SOLN COMPARISON:  None Available. FINDINGS: Lower chest: T9 vertebral body lytic metastasis. Multiple lytic rib metastases. 3.1 cm soft tissue mass left lateral body wall. No pleural or pericardial effusion. Dependent atelectasis in the lung bases. 3.7 cm cystic lesion in the left breast. Hepatobiliary: Subcentimeter calcified stone in the dependent aspect of the nondilated  gallbladder. No focal liver lesion or biliary ductal dilatation. Pancreas: Unremarkable. No pancreatic ductal dilatation or surrounding inflammatory changes. Spleen: Normal in size without focal abnormality. Adrenals/Urinary Tract: No adrenal mass. Symmetric renal parenchymal enhancement without focal lesion or evident urolithiasis. Urinary bladder nondistended. Stomach/Bowel: Stomach decompressed. Small bowel nondilated. Normal appendix. The colon is partially distended, without acute finding. Vascular/Lymphatic: Moderate scattered calcified aortoiliac atheromatous plaque without aneurysm or stenosis. Portal vein patent. No abdominal or pelvic adenopathy. Reproductive: Status post hysterectomy. No adnexal masses. Other: No ascites.  No free air. Musculoskeletal: Multiple lytic bone lesions throughout the lumbosacral spine, left ischium, bilateral iliac bones. Lytic lesion in the inferior left ischial tuberosity with pathologic fracture, minimally displaced. Pathologic compression deformity of L5 with a greater than 50% loss of height. Mild compression deformity of T12. fixation hardware in the posterior left acetabulum. IMPRESSION: 1. Multiple lytic bone lesions throughout the lumbosacral spine, left ischium, bilateral iliac bones, and ribs. Pathologic fractures of the inferior left ischial tuberosity, T12 and L5 vertebral bodies. 2. 3.1 cm soft tissue mass left lateral body wall. 3. 3.7 cm cystic lesion in the left breast. 4. Cholelithiasis. 5.  Aortic Atherosclerosis (ICD10-I70.0). Electronically Signed   By: Corlis Leak M.D.   On: 11/09/2022 13:53   CT Head Wo Contrast  Result Date: 11/09/2022 CLINICAL DATA:  Mental status change with unknown cause. Neck trauma EXAM: CT HEAD WITHOUT CONTRAST CT CERVICAL SPINE WITHOUT CONTRAST TECHNIQUE: Multidetector CT imaging of the head and cervical spine was performed following the standard protocol without intravenous contrast. Multiplanar CT image reconstructions of the  cervical spine were also generated. RADIATION DOSE REDUCTION: This exam was performed according to the departmental dose-optimization program which includes automated exposure control, adjustment of the mA and/or kV according to patient size and/or use of iterative reconstruction technique. COMPARISON:  Brain MRI 01/07/2021 FINDINGS: CT HEAD FINDINGS Brain: No evidence of acute infarction, hemorrhage, hydrocephalus, extra-axial collection or mass lesion/mass effect. Small right cerebellar infarct, chronic by prior MRI. Generalized brain atrophy. Vascular: No hyperdense vessel or unexpected calcification. Skull: Multiple lytic lesions scattered throughout the calvarium. Parasagittal right frontal and parasagittal left parietal deposits erode the inner table. Additional notable lesion in the left sphenoid wing eroding the inner table. Sinuses/Orbits: No acute finding CT CERVICAL SPINE FINDINGS Alignment: Normal Skull base and vertebrae: No acute fracture. Multiple lytic bone lesions seen in the C3, C6, C7, T1, T2, and T3 bodies. Notable posterior element deposits at T1-T4. Partially covered nonacute right clavicle fracture with bulky callus that could obscure underlying lesion. Soft tissues and spinal canal: No  prevertebral fluid or swelling. No visible canal hematoma. Disc levels:  Ordinary degenerative spurring. Upper chest: Sizable metastatic deposit at the right second rib posteriorly. IMPRESSION: Widespread osseous metastatic disease including deposits that erodes through the inner table. No acute intracranial finding.  No visible brain metastasis. Electronically Signed   By: Tiburcio Pea M.D.   On: 11/09/2022 11:45   CT Cervical Spine Wo Contrast  Result Date: 11/09/2022 CLINICAL DATA:  Mental status change with unknown cause. Neck trauma EXAM: CT HEAD WITHOUT CONTRAST CT CERVICAL SPINE WITHOUT CONTRAST TECHNIQUE: Multidetector CT imaging of the head and cervical spine was performed following the standard  protocol without intravenous contrast. Multiplanar CT image reconstructions of the cervical spine were also generated. RADIATION DOSE REDUCTION: This exam was performed according to the departmental dose-optimization program which includes automated exposure control, adjustment of the mA and/or kV according to patient size and/or use of iterative reconstruction technique. COMPARISON:  Brain MRI 01/07/2021 FINDINGS: CT HEAD FINDINGS Brain: No evidence of acute infarction, hemorrhage, hydrocephalus, extra-axial collection or mass lesion/mass effect. Small right cerebellar infarct, chronic by prior MRI. Generalized brain atrophy. Vascular: No hyperdense vessel or unexpected calcification. Skull: Multiple lytic lesions scattered throughout the calvarium. Parasagittal right frontal and parasagittal left parietal deposits erode the inner table. Additional notable lesion in the left sphenoid wing eroding the inner table. Sinuses/Orbits: No acute finding CT CERVICAL SPINE FINDINGS Alignment: Normal Skull base and vertebrae: No acute fracture. Multiple lytic bone lesions seen in the C3, C6, C7, T1, T2, and T3 bodies. Notable posterior element deposits at T1-T4. Partially covered nonacute right clavicle fracture with bulky callus that could obscure underlying lesion. Soft tissues and spinal canal: No prevertebral fluid or swelling. No visible canal hematoma. Disc levels:  Ordinary degenerative spurring. Upper chest: Sizable metastatic deposit at the right second rib posteriorly. IMPRESSION: Widespread osseous metastatic disease including deposits that erodes through the inner table. No acute intracranial finding.  No visible brain metastasis. Electronically Signed   By: Tiburcio Pea M.D.   On: 11/09/2022 11:45   DG Chest 2 View  Result Date: 11/09/2022 CLINICAL DATA:  Chest pain.  Back and foot wounds. EXAM: CHEST - 2 VIEW COMPARISON:  01/06/2021 FINDINGS: Moderate cardiomegaly again noted. Ectasia and atherosclerotic  calcification of the thoracic aorta again seen. Both lungs remain clear. No evidence of pneumothorax or pleural effusion. Several displaced left lateral rib fracture deformities are seen several displaced rib fractures are seen involving the left lateral 4th through 9th ribs, at least 1 of which shows periosteal new bone formation. IMPRESSION: Moderate cardiomegaly. No active lung disease. Multiple left lateral rib fractures, at least 1 of which shows periosteal new bone formation. Electronically Signed   By: Danae Orleans M.D.   On: 11/09/2022 11:16        Scheduled Meds:  vitamin C  500 mg Oral BID   aspirin EC  81 mg Oral Daily   brimonidine  1 drop Both Eyes BID   calcitonin (salmon)  1 spray Alternating Nares Daily   cholecalciferol  1,000 Units Oral Daily   enoxaparin (LOVENOX) injection  30 mg Subcutaneous QHS   feeding supplement  237 mL Oral BID BM   felodipine  5 mg Oral Daily   hydrocerin  1 Application Topical BID   latanoprost  1 drop Left Eye QHS   Latanoprostene Bunod  1 drop Ophthalmic QHS   multivitamin with minerals  1 tablet Oral Daily   zinc sulfate  220 mg Oral Daily  Continuous Infusions:  sodium chloride 75 mL/hr at 11/11/22 0509     LOS: 2 days        Charise Killian, MD [facility] Pager [phone]  If 7PM-7AM, please contact night-coverage [email or web address] 11/11/2022, 8:14 AM

## 2022-11-11 NOTE — Plan of Care (Signed)

## 2022-11-11 NOTE — Consult Note (Signed)
Chief Complaint: Patient was seen in consultation today for  Chief Complaint  Patient presents with   Laceration   Altered Mental Status   Referring Physician(s): Dr. Cathie Hoops  Supervising Physician: Dr. Loreta Ave  Patient Status: [facility] - In-pt  History of Present Illness: Cassandra Patterson is a 89 y.o. female with a medical history significant for HTN, TIA and Mobitz type 1 second-degree heart block. She presented to the ED 11/09/22 due to worsening generalized weakness. A chest x-ray showed multiple left rib fractures. Additional imaging was significant for widespread osseous metastatic disease, a soft tissue mass in the left lateral body wall and a cystic lesion in the left breast.   CT chest 11/10/22 IMPRESSION: 1. Multiple lytic bone metastases in the thoracic spine and ribs. 3.1 cm soft tissue lesion in the T9 vertebral body destroys the right pedicle and posterior cortex of the vertebral body with tumor extension into the spinal canal. MRI of the thoracic spine with and without contrast recommended to further evaluate. 2. Metastatic lesion in the T2 vertebral body destroys the posterior cortex on sagittal imaging suggests extension in the anterior spinal canal. 3. Pathologic fractures of the posterior right ninth and tenth ribs as well as the posterior left twelfth and tenth ribs. Innumerable thoracic spine and rib metastases evident. 4. Left axillary and subpectoral lymphadenopathy, incompletely visualized. 5. 1.6 cm right thyroid lobe nodule. In the setting of significant comorbidities or limited life expectancy, no follow-up recommended (ref: J Am Coll Radiol. 2015 Feb;12(2): 143-50). 6. Small bilateral pleural effusions with dependent collapse/consolidation in both lower lungs. 7. Tiny right lung nodules.  Metastatic disease not excluded. 8. Aortic Atherosclerosis (ICD10-I70.0) and Emphysema (ICD10-J43.9).  Interventional Radiology was asked to evaluate this patient for  a tissue sample for further work up. Dr. Loreta Ave reviewed the imaging and approved this patient for a lumbar 5 bone lesion biopsy.   Past Medical History:  Diagnosis Date   Cataracts, bilateral    Glaucoma    Hypertension    Mobitz type 1 second degree atrioventricular block    TIA (transient ischemic attack)     Past Surgical History:  Procedure Laterality Date   CATARACT EXTRACTION      Allergies: Patient has no known allergies.  Medications: Prior to Admission medications   Medication Sig Start Date End Date Taking? Authorizing Provider  acetaminophen (TYLENOL) 650 MG CR tablet Take 650 mg by mouth every 8 (eight) hours as needed for pain.   Yes [provider]  ammonium lactate (AMLACTIN) 12 % cream Apply topically as needed. 07/24/19  Yes [provider]  aspirin EC 81 MG tablet Take 81 mg by mouth daily. Swallow whole.   Yes [provider]  brimonidine (ALPHAGAN) 0.15 % ophthalmic solution Place 1 drop into both eyes 2 (two) times daily. 12/12/19  Yes [provider]  calcitonin, salmon, (MIACALCIN/FORTICAL) 200 UNIT/ACT nasal spray Place 1 spray into alternate nostrils daily. 10/10/22  Yes [provider]  Cholecalciferol (VITAMIN D-3) 25 MCG (1000 UT) CAPS Take by mouth daily.   Yes [provider]  COD LIVER OIL PO Take by mouth as needed.   Yes [provider]  felodipine (PLENDIL) 5 MG 24 hr tablet TAKE 1 TABLET(5 MG) BY MOUTH EVERY DAY 08/14/22  Yes End, Cristal Deer, MD  Latanoprostene Bunod (VYZULTA OP) Apply 1 drop to eye at bedtime. Right eye   Yes [provider]  LUMIGAN 0.01 % SOLN Place 1 drop into the left eye at  bedtime. 06/18/21  Yes [provider]  Multiple Vitamins-Minerals (ONE-A-DAY WOMENS PO) Take 1 tablet by mouth daily.   Yes [provider]     Family History  Problem Relation Age of Onset   Hypertension Mother    Heart disease Neg Hx     Social History    Socioeconomic History   Marital status: Married    Spouse name: Not on file   Number of children: Not on file   Years of education: Not on file   Highest education level: Not on file  Occupational History   Not on file  Tobacco Use   Smoking status: Never   Smokeless tobacco: Never  Vaping Use   Vaping status: Never Used  Substance and Sexual Activity   Alcohol use: No   Drug use: No   Sexual activity: Not Currently  Other Topics Concern   Not on file  Social History Narrative   Not on file   Social Determinants of Health   Financial Resource Strain: Not on file  Food Insecurity: Patient Unable To Answer (11/09/2022)   Hunger Vital Sign    Worried About Running Out of Food in the Last Year: Patient unable to answer    Ran Out of Food in the Last Year: Patient unable to answer  Transportation Needs: Patient Unable To Answer (11/09/2022)   PRAPARE - Administrator, Civil Service (Medical): Patient unable to answer    Lack of Transportation (Non-Medical): Patient unable to answer  Physical Activity: Not on file  Stress: Not on file  Social Connections: Not on file    Review of Systems: A 12 point ROS discussed and pertinent positives are indicated in the HPI above.  All other systems are negative.  Review of Systems  Constitutional:  Positive for fatigue.  Respiratory:  Negative for cough and shortness of breath.   Gastrointestinal:  Negative for abdominal pain, diarrhea, nausea and vomiting.  Neurological:  Negative for headaches.    Vital Signs: BP (!) 112/55 (BP Location: Right Arm)   Pulse 66   Temp 99.1 F (37.3 C) (Oral)   Resp 19   Ht 5\' 3"  (1.6 m)   Wt 82 lb (37.2 kg)   SpO2 97%   BMI 14.53 kg/m   Physical Exam Constitutional:      General: She is not in acute distress.    Appearance: She is underweight.  HENT:     Mouth/Throat:     Mouth: Mucous membranes are moist.     Pharynx: Oropharynx is clear.  Pulmonary:     Effort:  Pulmonary effort is normal.  Abdominal:     Tenderness: There is no abdominal tenderness.  Skin:    General: Skin is warm and dry.  Neurological:     Mental Status: She is alert. She is disoriented.     Imaging: CT CHEST WO CONTRAST  Result Date: 11/11/2022 CLINICAL DATA:  Breast cancer staging.  * Tracking Code: BO * EXAM: CT CHEST WITHOUT CONTRAST TECHNIQUE: Multidetector CT imaging of the chest was performed following the standard protocol without IV contrast. RADIATION DOSE REDUCTION: This exam was performed according to the departmental dose-optimization program which includes automated exposure control, adjustment of the mA and/or kV according to patient size and/or use of iterative reconstruction technique. COMPARISON:  None Available. FINDINGS: Cardiovascular: The heart is markedly enlarged. No substantial pericardial effusion. Ascending thoracic aorta measures on the order of 4.2 cm diameter. Descending thoracic aorta measures 3.8 cm  diameter. Enlargement of the pulmonary outflow tract/main pulmonary arteries suggests pulmonary arterial hypertension. There is moderate atherosclerotic calcification of the abdominal aorta without aneurysm. Mediastinum/Nodes: 1.6 cm nodule identified right thyroid lobe. Upper normal mediastinal lymph nodes evident although assessment limited by lack of mediastinal fat and lack of intravenous contrast material. No evidence for gross hilar lymphadenopathy although assessment is limited by the lack of intravenous contrast on the current study. The esophagus has normal imaging features. No right axillary lymphadenopathy. Left axilla incompletely visualized. 10 mm short axis left subpectoral lymph node seen on image 49/2 with probable greater than 18 mm short axis left axillary node on 76/2, incompletely visualized. Left breast is been incompletely visualized with greater than 3 cm homogeneous well-defined incompletely visualized structure noted on image 110/2.  Lungs/Pleura: Centrilobular emphsyema noted. Perifissural nodule measuring 4 mm noted right lung on 60/4 with additional 4-5 mm nodules in the right lung on images 68 and 89 of series 4. There is dependent collapse/consolidation in both lower lungs with small bilateral pleural effusions. Upper Abdomen: Unremarkable on noncontrast imaging. Musculoskeletal: Multiple lytic bone metastases evident in the thoracic spine and ribs including posterior right second rib on image 3/series 2. Metastatic lesion in the posterior T2 vertebral body erodes the posterior cortex. 3.1 cm soft tissue lesion in the T9 vertebral body destroys the right pedicle and posterior cortex of the vertebral body with tumor extension into the spinal canal (see image 68/2). Chronic fracture nonunion noted in the right clavicle pathologic fracture noted posterior right ninth and tenth ribs as well as the posterior left twelfth in tenth ribs. Multiple nonacute left-sided rib fractures evident. IMPRESSION: 1. Multiple lytic bone metastases in the thoracic spine and ribs. 3.1 cm soft tissue lesion in the T9 vertebral body destroys the right pedicle and posterior cortex of the vertebral body with tumor extension into the spinal canal. MRI of the thoracic spine with and without contrast recommended to further evaluate. 2. Metastatic lesion in the T2 vertebral body destroys the posterior cortex on sagittal imaging suggests extension in the anterior spinal canal. 3. Pathologic fractures of the posterior right ninth and tenth ribs as well as the posterior left twelfth and tenth ribs. Innumerable thoracic spine and rib metastases evident. 4. Left axillary and subpectoral lymphadenopathy, incompletely visualized. 5. 1.6 cm right thyroid lobe nodule. In the setting of significant comorbidities or limited life expectancy, no follow-up recommended (ref: J Am Coll Radiol. 2015 Feb;12(2): 143-50). 6. Small bilateral pleural effusions with dependent  collapse/consolidation in both lower lungs. 7. Tiny right lung nodules.  Metastatic disease not excluded. 8. Aortic Atherosclerosis (ICD10-I70.0) and Emphysema (ICD10-J43.9). Electronically Signed   By: Kennith Center M.D.   On: 11/11/2022 10:29   CT ABDOMEN PELVIS W CONTRAST  Result Date: 11/09/2022 CLINICAL DATA:  Acute abdominal pain EXAM: CT ABDOMEN AND PELVIS WITH CONTRAST TECHNIQUE: Multidetector CT imaging of the abdomen and pelvis was performed using the standard protocol following bolus administration of intravenous contrast. RADIATION DOSE REDUCTION: This exam was performed according to the departmental dose-optimization program which includes automated exposure control, adjustment of the mA and/or kV according to patient size and/or use of iterative reconstruction technique. CONTRAST:  75mL OMNIPAQUE IOHEXOL 300 MG/ML  SOLN COMPARISON:  None Available. FINDINGS: Lower chest: T9 vertebral body lytic metastasis. Multiple lytic rib metastases. 3.1 cm soft tissue mass left lateral body wall. No pleural or pericardial effusion. Dependent atelectasis in the lung bases. 3.7 cm cystic lesion in the left breast. Hepatobiliary: Subcentimeter calcified stone  in the dependent aspect of the nondilated gallbladder. No focal liver lesion or biliary ductal dilatation. Pancreas: Unremarkable. No pancreatic ductal dilatation or surrounding inflammatory changes. Spleen: Normal in size without focal abnormality. Adrenals/Urinary Tract: No adrenal mass. Symmetric renal parenchymal enhancement without focal lesion or evident urolithiasis. Urinary bladder nondistended. Stomach/Bowel: Stomach decompressed. Small bowel nondilated. Normal appendix. The colon is partially distended, without acute finding. Vascular/Lymphatic: Moderate scattered calcified aortoiliac atheromatous plaque without aneurysm or stenosis. Portal vein patent. No abdominal or pelvic adenopathy. Reproductive: Status post hysterectomy. No adnexal masses.  Other: No ascites.  No free air. Musculoskeletal: Multiple lytic bone lesions throughout the lumbosacral spine, left ischium, bilateral iliac bones. Lytic lesion in the inferior left ischial tuberosity with pathologic fracture, minimally displaced. Pathologic compression deformity of L5 with a greater than 50% loss of height. Mild compression deformity of T12. fixation hardware in the posterior left acetabulum. IMPRESSION: 1. Multiple lytic bone lesions throughout the lumbosacral spine, left ischium, bilateral iliac bones, and ribs. Pathologic fractures of the inferior left ischial tuberosity, T12 and L5 vertebral bodies. 2. 3.1 cm soft tissue mass left lateral body wall. 3. 3.7 cm cystic lesion in the left breast. 4. Cholelithiasis. 5.  Aortic Atherosclerosis (ICD10-I70.0). Electronically Signed   By: Corlis Leak M.D.   On: 11/09/2022 13:53   CT Head Wo Contrast  Result Date: 11/09/2022 CLINICAL DATA:  Mental status change with unknown cause. Neck trauma EXAM: CT HEAD WITHOUT CONTRAST CT CERVICAL SPINE WITHOUT CONTRAST TECHNIQUE: Multidetector CT imaging of the head and cervical spine was performed following the standard protocol without intravenous contrast. Multiplanar CT image reconstructions of the cervical spine were also generated. RADIATION DOSE REDUCTION: This exam was performed according to the departmental dose-optimization program which includes automated exposure control, adjustment of the mA and/or kV according to patient size and/or use of iterative reconstruction technique. COMPARISON:  Brain MRI 01/07/2021 FINDINGS: CT HEAD FINDINGS Brain: No evidence of acute infarction, hemorrhage, hydrocephalus, extra-axial collection or mass lesion/mass effect. Small right cerebellar infarct, chronic by prior MRI. Generalized brain atrophy. Vascular: No hyperdense vessel or unexpected calcification. Skull: Multiple lytic lesions scattered throughout the calvarium. Parasagittal right frontal and parasagittal  left parietal deposits erode the inner table. Additional notable lesion in the left sphenoid wing eroding the inner table. Sinuses/Orbits: No acute finding CT CERVICAL SPINE FINDINGS Alignment: Normal Skull base and vertebrae: No acute fracture. Multiple lytic bone lesions seen in the C3, C6, C7, T1, T2, and T3 bodies. Notable posterior element deposits at T1-T4. Partially covered nonacute right clavicle fracture with bulky callus that could obscure underlying lesion. Soft tissues and spinal canal: No prevertebral fluid or swelling. No visible canal hematoma. Disc levels:  Ordinary degenerative spurring. Upper chest: Sizable metastatic deposit at the right second rib posteriorly. IMPRESSION: Widespread osseous metastatic disease including deposits that erodes through the inner table. No acute intracranial finding.  No visible brain metastasis. Electronically Signed   By: Tiburcio Pea M.D.   On: 11/09/2022 11:45   CT Cervical Spine Wo Contrast  Result Date: 11/09/2022 CLINICAL DATA:  Mental status change with unknown cause. Neck trauma EXAM: CT HEAD WITHOUT CONTRAST CT CERVICAL SPINE WITHOUT CONTRAST TECHNIQUE: Multidetector CT imaging of the head and cervical spine was performed following the standard protocol without intravenous contrast. Multiplanar CT image reconstructions of the cervical spine were also generated. RADIATION DOSE REDUCTION: This exam was performed according to the departmental dose-optimization program which includes automated exposure control, adjustment of the mA and/or kV according to patient  size and/or use of iterative reconstruction technique. COMPARISON:  Brain MRI 01/07/2021 FINDINGS: CT HEAD FINDINGS Brain: No evidence of acute infarction, hemorrhage, hydrocephalus, extra-axial collection or mass lesion/mass effect. Small right cerebellar infarct, chronic by prior MRI. Generalized brain atrophy. Vascular: No hyperdense vessel or unexpected calcification. Skull: Multiple lytic  lesions scattered throughout the calvarium. Parasagittal right frontal and parasagittal left parietal deposits erode the inner table. Additional notable lesion in the left sphenoid wing eroding the inner table. Sinuses/Orbits: No acute finding CT CERVICAL SPINE FINDINGS Alignment: Normal Skull base and vertebrae: No acute fracture. Multiple lytic bone lesions seen in the C3, C6, C7, T1, T2, and T3 bodies. Notable posterior element deposits at T1-T4. Partially covered nonacute right clavicle fracture with bulky callus that could obscure underlying lesion. Soft tissues and spinal canal: No prevertebral fluid or swelling. No visible canal hematoma. Disc levels:  Ordinary degenerative spurring. Upper chest: Sizable metastatic deposit at the right second rib posteriorly. IMPRESSION: Widespread osseous metastatic disease including deposits that erodes through the inner table. No acute intracranial finding.  No visible brain metastasis. Electronically Signed   By: Tiburcio Pea M.D.   On: 11/09/2022 11:45   DG Chest 2 View  Result Date: 11/09/2022 CLINICAL DATA:  Chest pain.  Back and foot wounds. EXAM: CHEST - 2 VIEW COMPARISON:  01/06/2021 FINDINGS: Moderate cardiomegaly again noted. Ectasia and atherosclerotic calcification of the thoracic aorta again seen. Both lungs remain clear. No evidence of pneumothorax or pleural effusion. Several displaced left lateral rib fracture deformities are seen several displaced rib fractures are seen involving the left lateral 4th through 9th ribs, at least 1 of which shows periosteal new bone formation. IMPRESSION: Moderate cardiomegaly. No active lung disease. Multiple left lateral rib fractures, at least 1 of which shows periosteal new bone formation. Electronically Signed   By: Danae Orleans M.D.   On: 11/09/2022 11:16    Labs:  CBC: Recent Labs    03/26/22 1054 11/09/22 1144 11/10/22 0716 11/11/22 0510  WBC 5.3 8.6 6.7 5.4  HGB 12.3 9.4* 8.6* 7.3*  HCT 41.1 32.3*  30.0* 25.2*  PLT 270 239 217 203    COAGS: No results for input(s): "INR", "APTT" in the last 8760 hours.  BMP: Recent Labs    03/26/22 1054 11/09/22 1144 11/10/22 0529 11/11/22 0510  NA 138 142 143 136  K 3.8 4.7 4.3 3.9  CL 105 109 116* 112*  CO2 25 20* 20* 19*  GLUCOSE 117* 104* 64* 328*  BUN 27* 54* 36* 30*  CALCIUM 9.1 8.7* 8.1* 7.5*  CREATININE 0.81 1.27* 0.97 0.91  GFRNONAA >60 40* 55* 59*    LIVER FUNCTION TESTS: Recent Labs    03/26/22 1054 11/09/22 1144 11/09/22 1350 11/11/22 0510  BILITOT 0.8 1.1 1.1 0.8  AST 21 61* 61* 35  ALT 16 23 24 18   ALKPHOS 94 172* 164* 135*  PROT 7.5 6.3* 6.3* 4.8*  ALBUMIN 3.3* 3.1* 2.8* 2.2*    TUMOR MARKERS: No results for input(s): "AFPTM", "CEA", "CA199", "CHROMGRNA" in the last 8760 hours.  Assessment and Plan:  Metastatic disease; unknown primary: Cassandra Patterson, 89 year old female, is tentatively scheduled 11/12/22 for an image-guided L5 bone lesion biopsy. The procedure was discussed at the bedside with the patient and her two sisters. Consent was signed by one of the sisters.   Risks and benefits of this procedure were discussed with the patient and/or patient's family including, but not limited to bleeding, infection, damage to adjacent structures or low  yield requiring additional tests.  All of the questions were answered and there is agreement to proceed. Patient will be NPO at midnight.   Consent signed and in chart.  Thank you for this interesting consult.  I greatly enjoyed meeting Cassandra Patterson and look forward to participating in their care.  A copy of this report was sent to the requesting provider on this date.  Electronically Signed: Alwyn Ren, AGACNP-BC [phone] 11/11/2022, 3:54 PM   I spent a total of 20 Minutes    in face to face in clinical consultation, greater than 50% of which was counseling/coordinating care for bone lesion biopsy.

## 2022-11-11 NOTE — Telephone Encounter (Signed)
Please advise if ok to refill Felodipine 5 mg tablet every day. Last filled by  Date: 11/09/2022 Department: [facility] 1C MEDICAL TELEMETRY Ordering/Authorizing: Cox, Amy N, DO

## 2022-11-11 NOTE — Progress Notes (Signed)
CBG 67  RN checked CBG due to low glucose on lab draw on 11/10/22.   Rn attempted to have the patient drink 4oz orange juice. Patient threw up the orange juice.   RN notified Dr. Arville Care of the above information.  Orders Received: 1 amp D50  Interventions: 1 amp D50 administered   RN rechecked CBG post amp administration. CBG 215

## 2022-11-12 ENCOUNTER — Inpatient Hospital Stay: Payer: PPO

## 2022-11-12 DIAGNOSIS — R531 Weakness: Secondary | ICD-10-CM | POA: Diagnosis not present

## 2022-11-12 LAB — COMPREHENSIVE METABOLIC PANEL
ALT: 19 U/L (ref 0–44)
AST: 35 U/L (ref 15–41)
Albumin: 2.4 g/dL — ABNORMAL LOW (ref 3.5–5.0)
Alkaline Phosphatase: 137 U/L — ABNORMAL HIGH (ref 38–126)
Anion gap: 5 (ref 5–15)
BUN: 27 mg/dL — ABNORMAL HIGH (ref 8–23)
CO2: 20 mmol/L — ABNORMAL LOW (ref 22–32)
Calcium: 8.3 mg/dL — ABNORMAL LOW (ref 8.9–10.3)
Chloride: 115 mmol/L — ABNORMAL HIGH (ref 98–111)
Creatinine, Ser: 0.91 mg/dL (ref 0.44–1.00)
GFR, Estimated: 59 mL/min — ABNORMAL LOW (ref >60)
Glucose, Bld: 82 mg/dL (ref 70–99)
Potassium: 4.2 mmol/L (ref 3.5–5.1)
Sodium: 140 mmol/L (ref 135–145)
Total Bilirubin: 0.6 mg/dL (ref 0.3–1.2)
Total Protein: 5.3 g/dL — ABNORMAL LOW (ref 6.5–8.1)

## 2022-11-12 LAB — CBC
HCT: 28.5 % — ABNORMAL LOW (ref 36.0–46.0)
Hemoglobin: 8.3 g/dL — ABNORMAL LOW (ref 12.0–15.0)
MCH: 23.3 pg — ABNORMAL LOW (ref 26.0–34.0)
MCHC: 29.1 g/dL — ABNORMAL LOW (ref 30.0–36.0)
MCV: 80.1 fL (ref 80.0–100.0)
Platelets: 229 10*3/uL (ref 150–400)
RBC: 3.56 MIL/uL — ABNORMAL LOW (ref 3.87–5.11)
RDW: 18.5 % — ABNORMAL HIGH (ref 11.5–15.5)
WBC: 5.8 10*3/uL (ref 4.0–10.5)
nRBC: 0.9 % — ABNORMAL HIGH (ref 0.0–0.2)

## 2022-11-12 LAB — PROTIME-INR
INR: 1.2 (ref 0.8–1.2)
Prothrombin Time: 15.3 seconds — ABNORMAL HIGH (ref 11.4–15.2)

## 2022-11-12 MED ORDER — MIDAZOLAM HCL 2 MG/2ML IJ SOLN
INTRAMUSCULAR | Status: AC | PRN
Start: 2022-11-12 — End: 2022-11-12
  Administered 2022-11-12: .5 mg via INTRAVENOUS

## 2022-11-12 MED ORDER — NALOXONE HCL 2 MG/2ML IJ SOSY
PREFILLED_SYRINGE | INTRAMUSCULAR | Status: AC
  Filled 2022-11-12: qty 2

## 2022-11-12 MED ORDER — MIDAZOLAM HCL 2 MG/2ML IJ SOLN
INTRAMUSCULAR | Status: AC
  Filled 2022-11-12: qty 2

## 2022-11-12 MED ORDER — SODIUM CHLORIDE 0.9 % IV SOLN: INTRAVENOUS | Status: DC

## 2022-11-12 MED ORDER — FENTANYL CITRATE (PF) 100 MCG/2ML IJ SOLN
INTRAMUSCULAR | Status: AC
  Filled 2022-11-12: qty 2

## 2022-11-12 MED ORDER — FLUMAZENIL 0.5 MG/5ML IV SOLN
INTRAVENOUS | Status: AC
  Filled 2022-11-12: qty 5

## 2022-11-12 MED ORDER — LIDOCAINE HCL (PF) 1 % IJ SOLN
10.0000 mL | Freq: Once | INTRAMUSCULAR | Status: AC
  Administered 2022-11-12: 10 mL via INTRADERMAL
  Filled 2022-11-12: qty 10

## 2022-11-12 NOTE — Telephone Encounter (Signed)
Patient is currently hospitalized with altered mental status and concern for heel infection.  As some of her medications may be adjusted at the time of discharge, I recommend holding off on refilling felodipine at this time.  Yvonne Kendall, MD [facility]

## 2022-11-12 NOTE — Care Management Important Message (Signed)
Important Message  Patient Details  Name: Cassandra Patterson MRN: [id number] Date of Birth: 01-16-1929   Important Message Given:  N/A - LOS <3 / Initial given by admissions     [name] [name] 11/12/2022, 9:00 AM

## 2022-11-12 NOTE — TOC Initial Note (Signed)
Transition of Care [facility]) - Initial/Assessment Note    Patient Details  Name: Cassandra Patterson MRN: [id number] Date of Birth: 07/01/1928  [facility] CM/SW Contact:    Allena Katz, LCSW Phone Number: 11/12/2022, 2:10 PM  Clinical Narrative:   Pt admitted from home with weakness. Pt only oriented to self at this time. Per notes, pt able to walk with walker before admission but unable to do so at this time. Pt to have a lumbar biopsy. Pt being followed by oncology, CT showed multiple osseous metastatic bone lesions. Pt currently lives alone with assistance of her sisters with chores. TOC to follow for ongoing needs and update.   Expected Discharge Plan: Home w Home Health Services Barriers to Discharge: Continued Medical Work up   Patient Goals and CMS Choice Patient states their goals for this hospitalization and ongoing recovery are:: TBD          Expected Discharge Plan and Services                                              Prior Living Arrangements/Services     Patient language and need for interpreter reviewed:: Yes Do you feel safe going back to the place where you live?: Yes      Need for Family Participation in Patient Care: Yes (Comment) Care giver support system in place?: Yes (comment) Current home services: DME    Activities of Daily Living Home Assistive Devices/Equipment: Walker (specify type), Bedside commode/3-in-1 ADL Screening (condition at time of admission) Is the patient deaf or have difficulty hearing?: Yes Does the patient have difficulty seeing, even when wearing glasses/contacts?: Yes Does the patient have difficulty concentrating, remembering, or making decisions?: Yes  Permission Sought/Granted                  Emotional Assessment       Orientation: : Oriented to Self      Admission diagnosis:  Weakness [R53.1] Malignant neoplasm metastatic to bone (HCC) [C79.51] Altered mental status,  unspecified altered mental status type [R41.82] Patient Active Problem List   Diagnosis Date Noted   Malignant neoplasm metastatic to bone (HCC) 11/10/2022   Normocytic anemia 11/10/2022   Pressure injury of skin 11/10/2022   Weakness 11/09/2022   Severe protein-calorie malnutrition (HCC) 11/09/2022   AKI (acute kidney injury) (HCC) 11/09/2022   Lytic bone lesion of hip 11/09/2022   Dry skin 11/09/2022   History of TIA (transient ischemic attack) 05/01/2021   TIA (transient ischemic attack) 01/06/2021   Mobitz type 1 second degree AV block 02/22/2020   Palpitations 01/26/2020   Heart murmur 01/26/2020   Essential hypertension 01/26/2020   Bradycardia 01/26/2020   First degree AV block 01/26/2020   PCP:  Leanna Sato, MD Pharmacy:   [id number] [place], [place] - [address] AT [facility] OF [address] [place] [address] Phone: [phone] Fax: [phone]     Social Determinants of Health (SDOH) Social History: SDOH Screenings   Food Insecurity: Patient Unable To Answer (11/09/2022)  Housing: Patient Unable To Answer (11/09/2022)  Transportation Needs: Patient Unable To Answer (11/09/2022)  Utilities: Patient Unable To Answer (11/09/2022)  Tobacco Use: Low Risk  (11/09/2022)   SDOH Interventions:     Readmission Risk Interventions     No data  to display

## 2022-11-12 NOTE — Progress Notes (Signed)
PROGRESS NOTE    Cassandra Patterson  BTD:[id number] DOB: 11/13/1928 DOA: 11/09/2022 PCP: Leanna Sato, MD  121A/121A-AA  LOS: 3 days   Brief hospital course:   Assessment & Plan: Cassandra Patterson is a 89 year old female with history of Mobitz type I second-degree heart block, history of TIA, history of hypertension, who presents to the emergency department for chief concerns of worsening generalized weakness.   Weakness:  likely secondary to metastatic cancer, unknown primary.    Lytic bone lesion of hip: w/ metastatic lesions of lumbosacral spine, left ischium, bilateral iliac bones, ribs, pathologic fractures at the inferior left ischial tuberosity, T12, L5 vertebral bodies. Unknown primary. Large palpable breast mass as per onco. CT abd/pelvis shows 3.1 cm soft tissue mass left lateral body wall, 3.7 cm cystic lesion in the left breast, cholelithiasis. CT chest shows multiple lytic bone metastases in T spine, ribs, 3.1 cm soft tissue lesion in T9 vertebral body destroys right pedicle  w/ tumor extension into the spinal cord with multiple other findings, see CT impression.   --Protein electrophoresis, light chain ratios pending as per onco. Onco following and recs apprec.  --IR biopsy today   AKI: resolved   Normocytic anemia: H&H is trending down.  --monitor and transfuse to keep Hgb >7   Severe protein-calorie malnutrition:  nutrition consulted   Hx of TIA: continue on aspirin    HTN:  continue on home dose of felodipine     DVT prophylaxis: Lovenox SQ Code Status: Full code  Family Communication: sister updated at bedside today Level of care: Telemetry Medical Dispo:   The patient is from: home Anticipated d/c is to: to be determined Anticipated d/c date is: 1-2 days   Subjective and Interval History:  Cassandra Patterson went for IR biopsy today.  During the procedure, Cassandra Patterson was noted to have a large liquid BM.  Cassandra Patterson denied pain.   Objective: Vitals:   11/12/22 1440  11/12/22 1445 11/12/22 1450 11/12/22 1509  BP: (!) 165/87 (!) 165/73 (!) 143/76 132/74  Pulse:    73  Resp: (!) 22 (!) 22 (!) 25 16  Temp:    98.9 F (37.2 C)  TempSrc:    Oral  SpO2:    98%  Weight:      Height:        Intake/Output Summary (Last 24 hours) at 11/12/2022 2031 Last data filed at 11/12/2022 1915 Gross per 24 hour  Intake 27 ml  Output 575 ml  Net -548 ml   Filed Weights   11/09/22 1053  Weight: 37.2 kg    Examination:   Constitutional: NAD, alert, oriented HEENT: conjunctivae and lids normal, EOMI CV: No cyanosis.   RESP: normal respiratory effort Neuro: II - XII grossly intact.   Psych: Normal mood and affect.     Data Reviewed: I have personally reviewed labs and imaging studies  Time spent: 35 minutes  Darlin Priestly, MD [facility] If 7PM-7AM, please contact night-coverage 11/12/2022, 8:31 PM

## 2022-11-12 NOTE — Plan of Care (Signed)

## 2022-11-12 NOTE — Procedures (Signed)
Interventional Radiology Procedure Note   History: 89 yo female referred for a biopsy to establish dx of metastatic breast CA. Discussed with Dr. Cathie Hoops of Oncology. Based on patient condition, and her mentation, I feel that a decubitus or prone attempt for L5 bx is not safe. We will proceed with image guided biopsy of the breast mass/chest mass for dx.   New consent was obtained.   Procedure: CT guided biopsy of left chest wall/breast mass  Complications: None EBL: None Specimen: Mx 18g core  Recommendations: - Bedrest per primary - stable to room - follow up path - Routine wound care - diet per primary  Signed,  Yvone Neu. Loreta Ave, DO, ABVM, RPVI

## 2022-11-13 DIAGNOSIS — R531 Weakness: Secondary | ICD-10-CM | POA: Diagnosis not present

## 2022-11-13 LAB — CBC
HCT: 26.9 % — ABNORMAL LOW (ref 36.0–46.0)
Hemoglobin: 8 g/dL — ABNORMAL LOW (ref 12.0–15.0)
MCH: 23.3 pg — ABNORMAL LOW (ref 26.0–34.0)
MCHC: 29.7 g/dL — ABNORMAL LOW (ref 30.0–36.0)
MCV: 78.4 fL — ABNORMAL LOW (ref 80.0–100.0)
Platelets: 232 10*3/uL (ref 150–400)
RBC: 3.43 MIL/uL — ABNORMAL LOW (ref 3.87–5.11)
RDW: 18.7 % — ABNORMAL HIGH (ref 11.5–15.5)
WBC: 6.8 10*3/uL (ref 4.0–10.5)
nRBC: 0.6 % — ABNORMAL HIGH (ref 0.0–0.2)

## 2022-11-13 LAB — BASIC METABOLIC PANEL
Anion gap: 5 (ref 5–15)
BUN: 24 mg/dL — ABNORMAL HIGH (ref 8–23)
CO2: 22 mmol/L (ref 22–32)
Calcium: 8.3 mg/dL — ABNORMAL LOW (ref 8.9–10.3)
Chloride: 113 mmol/L — ABNORMAL HIGH (ref 98–111)
Creatinine, Ser: 0.83 mg/dL (ref 0.44–1.00)
GFR, Estimated: >60 mL/min (ref >60)
Glucose, Bld: 75 mg/dL (ref 70–99)
Potassium: 4.2 mmol/L (ref 3.5–5.1)
Sodium: 140 mmol/L (ref 135–145)

## 2022-11-13 LAB — MAGNESIUM: Magnesium: 2.1 mg/dL (ref 1.7–2.4)

## 2022-11-13 LAB — SURGICAL PATHOLOGY

## 2022-11-13 NOTE — Plan of Care (Signed)

## 2022-11-13 NOTE — TOC Progression Note (Signed)
Transition of Care [facility]) - Progression Note    Patient Details  Name: Cassandra Patterson MRN: [id number] Date of Birth: [birth year]  Transition of Care [facility]) CM/SW Contact  Allena Katz, LCSW Phone Number: 11/13/2022, 3:04 PM  Clinical Narrative:   Consult placed for palliative. TOC waiting for palliative to have goals of care discussion.     Expected Discharge Plan: Home w Home Health Services Barriers to Discharge: Continued Medical Work up  Expected Discharge Plan and Services                                               Social Determinants of Health (SDOH) Interventions SDOH Screenings   Food Insecurity: Patient Unable To Answer (11/09/2022)  Housing: Patient Unable To Answer (11/09/2022)  Transportation Needs: Patient Unable To Answer (11/09/2022)  Utilities: Patient Unable To Answer (11/09/2022)  Tobacco Use: Low Risk  (11/09/2022)    Readmission Risk Interventions     No data to display

## 2022-11-13 NOTE — Evaluation (Signed)
Physical Therapy Evaluation Patient Details Name: Cassandra Patterson MRN: [id number] DOB: Feb 02, 1929 Today's Date: 11/13/2022  History of Present Illness  Pt is a 89 yo female that presented to ED for weakness. PMH of TIA, HTN, mobitz. Imaging showed multiple L rib fractures and further workup showed osseous metastatic disease.   Clinical Impression  Patient lethargic throughout but does wake to speak with therapist. Oriented to name, "hospital" and family in room. Very delayed responses noted, difficulty initiating conversation/speech and returns to resting eyes closed. Per family at baseline needs some assistance with all mobility but unclear how much assistance.   The patient required supine <> sit maxA and with constant encouragement. Pt exhibited mild pain signs/symptoms with mobility. She was able to sit EOB for several minutes, ~20seconds with CGA but predominantly CGA-modA. Sit <> Stand attempted three times with maxA, unable to truly support herself or come up into fully standing. repositioned in bed with needs in reach.The patient demonstrated a decline in function from baseline and would benefit from skilled PT to return to PLOF as able.         If plan is discharge home, recommend the following: Two people to help with walking and/or transfers;Two people to help with bathing/dressing/bathroom;Direct supervision/assist for medications management;Help with stairs or ramp for entrance;Assist for transportation;Assistance with feeding;Assistance with cooking/housework;Supervision due to cognitive status   Can travel by private vehicle   No    Equipment Recommendations Other (comment) (if pt is discharge home, will need hoyer lift, hospital bed, wheelchair)  Recommendations for Other Services       Functional Status Assessment Patient has had a recent decline in their functional status and demonstrates the ability to make significant improvements in function in a reasonable and  predictable amount of time.     Precautions / Restrictions Precautions Precautions: Fall Restrictions Weight Bearing Restrictions: No      Mobility  Bed Mobility Overal bed mobility: Needs Assistance Bed Mobility: Supine to Sit, Sit to Supine     Supine to sit: Max assist Sit to supine: Max assist        Transfers Overall transfer level: Needs assistance Equipment used: Rolling walker (2 wheels), 1 person hand held assist Transfers: Sit to/from Stand Sit to Stand: Max assist           General transfer comment: maxA with RW and without    Ambulation/Gait               General Gait Details: unable  Stairs            Wheelchair Mobility     Tilt Bed    Modified Rankin (Stroke Patients Only)       Balance Overall balance assessment: Needs assistance Sitting-balance support: Feet supported Sitting balance-Leahy Scale: Poor Sitting balance - Comments: progressed to sitting with CGA ~20seconds but predominantly required CGA-modA Postural control: Posterior lean, Right lateral lean   Standing balance-Leahy Scale: Zero                               Pertinent Vitals/Pain Pain Assessment Pain Assessment: Faces Faces Pain Scale: Hurts little more Pain Location: L ribs/flank Pain Descriptors / Indicators: Grimacing, Guarding, Moaning Pain Intervention(s): Limited activity within patient's tolerance, Monitored during session, Repositioned    Home Living Family/patient expects to be discharged to:: Private residence Living Arrangements: Alone Available Help at Discharge: Family Type of Home: House Home Access: Stairs to enter  Entrance Stairs-Number of Steps: 1   Home Layout: One level Home Equipment: Agricultural consultant (2 wheels)      Prior Function               Mobility Comments: family is assisting with mobility but unclear how much assist ADLs Comments: family performing IADLs     Extremity/Trunk Assessment    Upper Extremity Assessment Upper Extremity Assessment: Generalized weakness    Lower Extremity Assessment Lower Extremity Assessment: Generalized weakness (able to minimally lift against gravity with assistance)       Communication      Cognition Arousal: Lethargic Behavior During Therapy: Flat affect Overall Cognitive Status: Difficult to assess                                 General Comments: pt oriented to name, place but significantly delayed in answering and processing, able to follow one step commands intermittently and with extended time        General Comments      Exercises     Assessment/Plan    PT Assessment Patient needs continued PT services  PT Problem List Decreased strength;Decreased knowledge of use of DME;Decreased activity tolerance;Decreased balance;Decreased mobility;Pain;Decreased knowledge of precautions;Decreased safety awareness;Decreased range of motion       PT Treatment Interventions DME instruction;Neuromuscular re-education;Gait training;Stair training;Patient/family education;Functional mobility training;Therapeutic activities;Therapeutic exercise;Balance training    PT Goals (Current goals can be found in the Care Plan section)  Acute Rehab PT Goals PT Goal Formulation: Patient unable to participate in goal setting Time For Goal Achievement: 11/27/22 Potential to Achieve Goals: Fair    Frequency Min 1X/week     Co-evaluation               AM-PAC PT "6 Clicks" Mobility  Outcome Measure Help needed turning from your back to your side while in a flat bed without using bedrails?: Total Help needed moving from lying on your back to sitting on the side of a flat bed without using bedrails?: Total Help needed moving to and from a bed to a chair (including a wheelchair)?: Total Help needed standing up from a chair using your arms (e.g., wheelchair or bedside chair)?: Total Help needed to walk in hospital room?:  Total Help needed climbing 3-5 steps with a railing? : Total 6 Click Score: 6    End of Session Equipment Utilized During Treatment: Gait belt Activity Tolerance: Patient limited by fatigue Patient left: in bed;with call bell/phone within reach;with family/visitor present;with bed alarm set Nurse Communication: Mobility status PT Visit Diagnosis: Other abnormalities of gait and mobility (R26.89);Muscle weakness (generalized) (M62.81);Difficulty in walking, not elsewhere classified (R26.2)    Time: 2956-2130 PT Time Calculation (min) (ACUTE ONLY): 29 min   Charges:   PT Evaluation $PT Eval Moderate Complexity: 1 Mod PT Treatments $Therapeutic Activity: 23-37 mins PT General Charges $$ ACUTE PT VISIT: 1 Visit         Olga Coaster PT, DPT 3:00 PM,11/13/22

## 2022-11-13 NOTE — Progress Notes (Signed)
PROGRESS NOTE    Cassandra Patterson  ZOX:[id number] DOB: May 25, 1928 DOA: 11/09/2022 PCP: Leanna Sato, MD  121A/121A-AA  LOS: 4 days   Brief hospital course:   Assessment & Plan: Cassandra Patterson is a 89 year old female with history of Mobitz type I second-degree heart block, history of TIA, history of hypertension, who presents to the emergency department for chief concerns of worsening generalized weakness.   Weakness:  likely secondary to metastatic cancer, unknown primary.    Lytic bone lesion of hip: w/ metastatic lesions of lumbosacral spine, left ischium, bilateral iliac bones, ribs, pathologic fractures at the inferior left ischial tuberosity, T12, L5 vertebral bodies. Unknown primary. Large palpable breast mass as per onco. CT abd/pelvis shows 3.1 cm soft tissue mass left lateral body wall, 3.7 cm cystic lesion in the left breast, cholelithiasis. CT chest shows multiple lytic bone metastases in T spine, ribs, 3.1 cm soft tissue lesion in T9 vertebral body destroys right pedicle  w/ tumor extension into the spinal cord with multiple other findings, see CT impression.   --Protein electrophoresis, light chain ratios pending as per onco. Onco following and recs apprec.  --IR biopsy on 9/25 Plan: --further management per onc   AKI: resolved   Normocytic anemia: H&H is trending down.  --monitor and transfuse to keep Hgb >7   Severe protein-calorie malnutrition:  nutrition consulted  --supplements per dietician   Hx of TIA: continue on aspirin    HTN:  --cont home felodipine    DVT prophylaxis: Lovenox SQ Code Status: Full code  Family Communication: sister updated at bedside today Level of care: Telemetry Medical Dispo:   The patient is from: home Anticipated d/c is to: SNF rehab Anticipated d/c date is: whenever bed available   Subjective and Interval History:  Pt had pain mostly with movement.   Objective: Vitals:   11/13/22 0527 11/13/22 0744  11/13/22 1306 11/13/22 1548  BP: (!) 142/70 (!) 140/67 116/67 119/76  Pulse: 60 (!) 59 69 74  Resp: 19 20 18 16   Temp: 98.9 F (37.2 C) 98.9 F (37.2 C) 98.1 F (36.7 C) 98.3 F (36.8 C)  TempSrc:  Oral Oral   SpO2: 100% 98% 98% 97%  Weight:      Height:        Intake/Output Summary (Last 24 hours) at 11/13/2022 1956 Last data filed at 11/13/2022 1941 Gross per 24 hour  Intake 545.17 ml  Output --  Net 545.17 ml   Filed Weights   11/09/22 1053  Weight: 37.2 kg    Examination:   Constitutional: NAD, alert, appeared to have some trouble processing and slow to respond HEENT: conjunctivae and lids normal, EOMI CV: No cyanosis.   RESP: normal respiratory effort, on RA Neuro: II - XII grossly intact.     Data Reviewed: I have personally reviewed labs and imaging studies  Time spent: 35 minutes  Darlin Priestly, MD [facility] If 7PM-7AM, please contact night-coverage 11/13/2022, 7:56 PM

## 2022-11-13 NOTE — Progress Notes (Signed)
Nutrition Follow-up  DOCUMENTATION CODES:   Severe malnutrition in context of chronic illness, Underweight  INTERVENTION:   -Continue Ensure Enlive po BID, each supplement provides 350 kcal and 20 grams of protein.  -Continue Magic cup BID with meals, each supplement provides 290 kcal and 9 grams of protein  -Continue MVI with minerals daily -Continue regular diet -Continue 500 mg vitamin C BID -Continue 220 mg zinc sulfate daily x 14 days   NUTRITION DIAGNOSIS:   Severe Malnutrition related to chronic illness (concern for widespread metastatic disease) as evidenced by moderate muscle depletion, severe muscle depletion, moderate fat depletion, severe fat depletion, percent weight loss.  Ongoing  GOAL:   Patient will meet greater than or equal to 90% of their needs  Unmet  MONITOR:   PO intake, Supplement acceptance  REASON FOR ASSESSMENT:   Consult Assessment of nutrition requirement/status  ASSESSMENT:   Pt with history of Mobitz type I second-degree heart block, history of TIA, history of hypertension, who presents for chief concerns of worsening generalized weakness.  9/25- s/p CT guided biopsy of left chest wall/breast mass   Reviewed I/O's: +32 ml x 24 hours and +2.5 L since admission  UOP: 300 ml x 24 hours   Per [facility]OC[facility] note on 11/10/22; pt with partial thickness skin tear to lt heel.   Per MD notes, ruling out metastatic breast cancer and underwent lt chest wall/ breast mass biopsy.   Pt lying in bed at time of visit. Pt was lethargic. Spoke with pt sister at bedside. Per sister, pt remains with poor oral and as well as waxing and waning mentation. Noted meal completions 0-30%. Mentation usually improves in the afternoon and pt is usually pretty sleepy in the morning. Pt has been preferring softer foods, such as applesauce and oatmeal. Pt liked sausage today, but refused to eat it today. Offered to mechanically downgrade diet for ease of intake, however, sister  would like to continue with regular diet for wider variety of meal selections. Pt has been drinking some of the Ensure supplements. Discussed importance of good meal and supplement intake to promote healing.   No new wt since last visit.   Medications reviewed and include vitamin C, vitamin D3, sodium bicarbonate, and zinc sulfate.   Labs reviewed: CBGS: 67-215 (inpatient orders for glycemic control are none).    Diet Order:   Diet Order             Diet regular Room service appropriate? Yes; Fluid consistency: Thin  Diet effective now                   EDUCATION NEEDS:   No education needs have been identified at this time  Skin:  Skin Assessment: Skin Integrity Issues: Skin Integrity Issues:: Stage III, Other (Comment) Stage III: sacrum, buttocks Other: lt heel laceration  Last BM:  11/12/22 (type 7)  Height:   Ht Readings from Last 1 Encounters:  11/09/22 5\' 3"  (1.6 m)    Weight:   Wt Readings from Last 1 Encounters:  11/09/22 37.2 kg    Ideal Body Weight:  52.3 kg  BMI:  Body mass index is 14.53 kg/m.  Estimated Nutritional Needs:   Kcal:  1450-1650  Protein:  60-75 grams  Fluid:  > 1.4 L    Levada Schilling, RD, LDN, CDCES Registered Dietitian II Certified Diabetes Care and Education Specialist Please refer to [facility] for RD and/or RD on-call/weekend/after hours pager

## 2022-11-14 DIAGNOSIS — Z170 Estrogen receptor positive status [ER+]: Secondary | ICD-10-CM

## 2022-11-14 DIAGNOSIS — Z515 Encounter for palliative care: Secondary | ICD-10-CM

## 2022-11-14 DIAGNOSIS — C50812 Malignant neoplasm of overlapping sites of left female breast: Secondary | ICD-10-CM | POA: Diagnosis not present

## 2022-11-14 DIAGNOSIS — R531 Weakness: Secondary | ICD-10-CM | POA: Diagnosis not present

## 2022-11-14 DIAGNOSIS — E43 Unspecified severe protein-calorie malnutrition: Secondary | ICD-10-CM

## 2022-11-14 DIAGNOSIS — C7951 Secondary malignant neoplasm of bone: Secondary | ICD-10-CM | POA: Diagnosis not present

## 2022-11-14 LAB — CBC
HCT: 29.2 % — ABNORMAL LOW (ref 36.0–46.0)
Hemoglobin: 8.6 g/dL — ABNORMAL LOW (ref 12.0–15.0)
MCH: 23.3 pg — ABNORMAL LOW (ref 26.0–34.0)
MCHC: 29.5 g/dL — ABNORMAL LOW (ref 30.0–36.0)
MCV: 79.1 fL — ABNORMAL LOW (ref 80.0–100.0)
Platelets: 235 10*3/uL (ref 150–400)
RBC: 3.69 MIL/uL — ABNORMAL LOW (ref 3.87–5.11)
RDW: 18.8 % — ABNORMAL HIGH (ref 11.5–15.5)
WBC: 6.1 10*3/uL (ref 4.0–10.5)
nRBC: 0.3 % — ABNORMAL HIGH (ref 0.0–0.2)

## 2022-11-14 LAB — MULTIPLE MYELOMA PANEL, SERUM
Albumin SerPl Elph-Mcnc: 2.4 g/dL — ABNORMAL LOW (ref 2.9–4.4)
Albumin/Glob SerPl: 1 (ref 0.7–1.7)
Alpha 1: 0.4 g/dL (ref 0.0–0.4)
Alpha2 Glob SerPl Elph-Mcnc: 0.6 g/dL (ref 0.4–1.0)
B-Globulin SerPl Elph-Mcnc: 0.7 g/dL (ref 0.7–1.3)
Gamma Glob SerPl Elph-Mcnc: 0.8 g/dL (ref 0.4–1.8)
Globulin, Total: 2.5 g/dL (ref 2.2–3.9)
IgA: 250 mg/dL (ref 64–422)
IgG (Immunoglobin G), Serum: 986 mg/dL (ref 586–1602)
IgM (Immunoglobulin M), Srm: 64 mg/dL (ref 26–217)
Total Protein ELP: 4.9 g/dL — ABNORMAL LOW (ref 6.0–8.5)

## 2022-11-14 LAB — BASIC METABOLIC PANEL
Anion gap: 7 (ref 5–15)
BUN: 24 mg/dL — ABNORMAL HIGH (ref 8–23)
CO2: 22 mmol/L (ref 22–32)
Calcium: 8.4 mg/dL — ABNORMAL LOW (ref 8.9–10.3)
Chloride: 113 mmol/L — ABNORMAL HIGH (ref 98–111)
Creatinine, Ser: 0.82 mg/dL (ref 0.44–1.00)
GFR, Estimated: >60 mL/min (ref >60)
Glucose, Bld: 84 mg/dL (ref 70–99)
Potassium: 4.2 mmol/L (ref 3.5–5.1)
Sodium: 142 mmol/L (ref 135–145)

## 2022-11-14 LAB — MAGNESIUM: Magnesium: 2.2 mg/dL (ref 1.7–2.4)

## 2022-11-14 NOTE — Progress Notes (Addendum)
Hematology/Oncology Progress note [phone] [phone]     Patient Care Team: Leanna Sato, MD as PCP - General (Family Medicine) End, Cristal Deer, MD as PCP - Cardiology (Cardiology)   Name of the patient: Cassandra Patterson  [id number]  [birth year]  Date of visit: 11/14/22   INTERVAL HISTORY-    11/12/2022 status post left breast/chest wall mass biopsy by IR. Pathology showed metastatic moderately differentiated adenocarcinoma consistent with breast primary.  ER positive, GATA3 positive.  PR and HER2 immunohistochemistry staining is pending.   No Known Allergies  Patient Active Problem List   Diagnosis Date Noted   Malignant neoplasm metastatic to bone (HCC) 11/10/2022   Normocytic anemia 11/10/2022   Pressure injury of skin 11/10/2022   Weakness 11/09/2022   Severe protein-calorie malnutrition (HCC) 11/09/2022   AKI (acute kidney injury) (HCC) 11/09/2022   Lytic bone lesion of hip 11/09/2022   Dry skin 11/09/2022   History of TIA (transient ischemic attack) 05/01/2021   TIA (transient ischemic attack) 01/06/2021   Mobitz type 1 second degree AV block 02/22/2020   Palpitations 01/26/2020   Heart murmur 01/26/2020   Essential hypertension 01/26/2020   Bradycardia 01/26/2020   First degree AV block 01/26/2020     Past Medical History:  Diagnosis Date   Cataracts, bilateral    Glaucoma    Hypertension    Mobitz type 1 second degree atrioventricular block    TIA (transient ischemic attack)      Past Surgical History:  Procedure Laterality Date   CATARACT EXTRACTION      Social History   Socioeconomic History   Marital status: Married    Spouse name: Not on file   Number of children: Not on file   Years of education: Not on file   Highest education level: Not on file  Occupational History   Not on file  Tobacco Use   Smoking status: Never   Smokeless tobacco: Never  Vaping Use   Vaping status: Never Used  Substance and  Sexual Activity   Alcohol use: No   Drug use: No   Sexual activity: Not Currently  Other Topics Concern   Not on file  Social History Narrative   Not on file   Social Determinants of Health   Financial Resource Strain: Not on file  Food Insecurity: Patient Unable To Answer (11/09/2022)   Hunger Vital Sign    Worried About Running Out of Food in the Last Year: Patient unable to answer    Ran Out of Food in the Last Year: Patient unable to answer  Transportation Needs: Patient Unable To Answer (11/09/2022)   PRAPARE - Transportation    Lack of Transportation (Medical): Patient unable to answer    Lack of Transportation (Non-Medical): Patient unable to answer  Physical Activity: Not on file  Stress: Not on file  Social Connections: Not on file  Intimate Partner Violence: Patient Unable To Answer (11/09/2022)   Humiliation, Afraid, Rape, and Kick questionnaire    Fear of Current or Ex-Partner: Patient unable to answer    Emotionally Abused: Patient unable to answer    Physically Abused: Patient unable to answer    Sexually Abused: Patient unable to answer     Family History  Problem Relation Age of Onset   Hypertension Mother    Heart disease Neg Hx      Current Facility-Administered Medications:    0.9 %  sodium chloride infusion, , Intravenous, Continuous, Kennieth Francois, PA, Last Rate: 10 mL/hr  at 11/12/22 1251, New Bag at 11/12/22 1251   acetaminophen (TYLENOL) tablet 650 mg, 650 mg, Oral, Q6H PRN, 650 mg at 11/11/22 1647 **OR** acetaminophen (TYLENOL) suppository 650 mg, 650 mg, Rectal, Q6H PRN, Cox, Amy N, DO   ascorbic acid (VITAMIN C) tablet 500 mg, 500 mg, Oral, BID, Mayford Knife, Jamiese M, MD, 500 mg at 11/14/22 1008   aspirin EC tablet 81 mg, 81 mg, Oral, Daily, Cox, Amy N, DO, 81 mg at 11/14/22 1009   brimonidine (ALPHAGAN) 0.15 % ophthalmic solution 1 drop, 1 drop, Both Eyes, BID, Cox, Amy N, DO, 1 drop at 11/14/22 1010   calcitonin (salmon) (MIACALCIN/FORTICAL) nasal  spray 1 spray, 1 spray, Alternating Nares, Daily, Cox, Amy N, DO, 1 spray at 11/14/22 1030   cholecalciferol (VITAMIN D3) tablet 1,000 Units, 1,000 Units, Oral, Daily, Cox, Amy N, DO, 1,000 Units at 11/14/22 1009   enoxaparin (LOVENOX) injection 30 mg, 30 mg, Subcutaneous, QHS, Cox, Amy N, DO, 30 mg at 11/13/22 2124   feeding supplement (ENSURE ENLIVE / ENSURE PLUS) liquid 237 mL, 237 mL, Oral, BID BM, Charise Killian, MD, 237 mL at 11/14/22 1011   felodipine (PLENDIL) 24 hr tablet 5 mg, 5 mg, Oral, Daily, Cox, Amy N, DO, 5 mg at 11/14/22 1011   hydrALAZINE (APRESOLINE) injection 5 mg, 5 mg, Intravenous, Q8H PRN, Cox, Amy N, DO   hydrocerin (EUCERIN) cream 1 Application, 1 Application, Topical, BID, Cox, Amy N, DO, 1 Application at 11/14/22 1010   latanoprost (XALATAN) 0.005 % ophthalmic solution 1 drop, 1 drop, Left Eye, QHS, Cox, Amy N, DO, 1 drop at 11/13/22 2132   Latanoprostene Bunod 0.024 % SOLN 1 drop, 1 drop, Ophthalmic, QHS, Cox, Amy N, DO, 1 drop at 11/13/22 2132   multivitamin with minerals tablet 1 tablet, 1 tablet, Oral, Daily, Cox, Amy N, DO, 1 tablet at 11/14/22 1009   ondansetron (ZOFRAN) tablet 4 mg, 4 mg, Oral, Q6H PRN **OR** ondansetron (ZOFRAN) injection 4 mg, 4 mg, Intravenous, Q6H PRN, Cox, Amy N, DO   senna-docusate (Senokot-S) tablet 1 tablet, 1 tablet, Oral, QHS PRN, Cox, Amy N, DO   sodium bicarbonate tablet 650 mg, 650 mg, Oral, BID, Mayford Knife, Jamiese M, MD, 650 mg at 11/14/22 1008   zinc sulfate capsule 220 mg, 220 mg, Oral, Daily, Charise Killian, MD, 220 mg at 11/14/22 1011   Physical exam:  Vitals:   11/13/22 1548 11/13/22 2147 11/14/22 0459 11/14/22 0800  BP: 119/76 131/70 136/67 (!) 143/71  Pulse: 74 62 (!) 59 (!) 55  Resp: 16 16 20 16   Temp: 98.3 F (36.8 C) 98.2 F (36.8 C) 99.5 F (37.5 C) 99.5 F (37.5 C)  TempSrc:  Oral Oral Oral  SpO2: 97% 96% 96% 96%  Weight:      Height:       Physical Exam Constitutional:      Appearance: She is  ill-appearing.  HENT:     Head: Normocephalic.  Eyes:     General: No scleral icterus. Cardiovascular:     Rate and Rhythm: Normal rate.  Pulmonary:     Effort: Pulmonary effort is normal. No respiratory distress.  Abdominal:     General: Abdomen is flat.  Musculoskeletal:     Cervical back: Normal range of motion.     Right lower leg: No edema.     Left lower leg: No edema.  Skin:    General: Skin is warm and dry.  Neurological:     Mental Status:  She is alert. Mental status is at baseline.  Psychiatric:        Mood and Affect: Mood normal.       Labs    Latest Ref Rng & Units 11/14/2022    7:22 AM 11/13/2022    5:45 AM 11/12/2022    7:04 AM  CBC  WBC 4.0 - 10.5 K/uL 6.1  6.8  5.8   Hemoglobin 12.0 - 15.0 g/dL 8.6  8.0  8.3   Hematocrit 36.0 - 46.0 % 29.2  26.9  28.5   Platelets 150 - 400 K/uL 235  232  229       Latest Ref Rng & Units 11/14/2022    7:22 AM 11/13/2022    5:45 AM 11/12/2022    7:04 AM  CMP  Glucose 70 - 99 mg/dL 84  75  82   BUN 8 - 23 mg/dL 24  24  27    Creatinine 0.44 - 1.00 mg/dL 8.65  7.84  6.96   Sodium 135 - 145 mmol/L 142  140  140   Potassium 3.5 - 5.1 mmol/L 4.2  4.2  4.2   Chloride 98 - 111 mmol/L 113  113  115   CO2 22 - 32 mmol/L 22  22  20    Calcium 8.9 - 10.3 mg/dL 8.4  8.3  8.3   Total Protein 6.5 - 8.1 g/dL   5.3   Total Bilirubin 0.3 - 1.2 mg/dL   0.6   Alkaline Phos 38 - 126 U/L   137   AST 15 - 41 U/L   35   ALT 0 - 44 U/L   19      RADIOGRAPHIC STUDIES: I have personally reviewed the radiological images as listed and agreed with the findings in the report. CT ASPIRATION N/S  Result Date: 11/12/2022 INDICATION: 89 year old female referred for biopsy L5 vertebral body, with inability to position on left decubitus or prone. We will proceed after speaking with oncology team in the patient's family with a left chest wall/breast mass biopsy EXAM: CT BIOPSY PUNCTURE ASPIRATION SUPERFICIAL FLUID MEDICATIONS: None.  ANESTHESIA/SEDATION: Moderate (conscious) sedation was not employed during this procedure. A total of Versed 0.5 mg and Fentanyl 0 mcg was administered intravenously. Moderate Sedation Time: 0 minutes. The patient's level of consciousness and vital signs were monitored continuously by radiology nursing throughout the procedure under my direct supervision. FLUOROSCOPY TIME:  CT COMPLICATIONS: None PROCEDURE: Informed written consent was obtained from the patient after a thorough discussion of the procedural risks, benefits and alternatives. All questions were addressed. Maximal Sterile Barrier Technique was utilized including caps, mask, sterile gowns, sterile gloves, sterile drape, hand hygiene and skin antiseptic. A timeout was performed prior to the initiation of the procedure. Patient was positioned supine on the CT gantry table. Scout CT of the chest performed for planning purposes. The patient is prepped and draped in the usual sterile fashion. 1% lidocaine was used for local anesthesia. Using CT guidance, guide needle was advanced into the solid mass of the left chest wall/breast. Once we confirmed needle tip position multiple 18 gauge core biopsy were acquired. Needle was removed. Final CT was acquired. Manual pressure was used for hemostasis. Sterile bandage was placed. Patient tolerated the procedure well and remained hemodynamically stable throughout. No complications were encountered and no significant blood loss. IMPRESSION: Status post CT-guided biopsy of left chest wall mass/breast mass. Signed, Yvone Neu. Miachel Roux, RPVI [facility] Radiology Electronically Signed  By: Gilmer Mor D.O.   On: 11/12/2022 16:07   MR THORACIC SPINE W WO CONTRAST  Result Date: 11/11/2022 CLINICAL DATA:  Metastatic disease evaluation.  Weakness. EXAM: MRI THORACIC WITHOUT AND WITH CONTRAST TECHNIQUE: Multiplanar and multiecho pulse sequences of the thoracic spine  were obtained without and with intravenous contrast. CONTRAST:  4mL GADAVIST GADOBUTROL 1 MMOL/ML IV SOLN COMPARISON:  CT chest 11/10/2022 FINDINGS: Multiple sequences are moderately motion degraded. Alignment: Increased thoracic kyphosis. Thoracic dextroscoliosis. No significant listhesis. Vertebrae: Widespread enhancing lesions throughout the thoracic spine as well as the cervical and included upper lumbar spine. Large destructive lesion involving the right aspect of the T9 vertebral body and pedicle as seen on CT with right-sided ventral and lateral epidural tumor not resulting in significant spinal stenosis or spinal cord mass effect. Small volume left-sided ventral epidural tumor at T2 without stenosis. T6 and T8 compression fractures with 50% anterior vertebral body height loss. Multiple rib lesions and rib fractures, better demonstrated on the prior CT. Cord:  Normal signal and morphology. Paraspinal and other soft tissues: Small bilateral pleural effusions. Disc levels: Mild thoracic spondylosis. No spinal stenosis. Moderate right neural foraminal encroachment at T9-10 due to tumor. IMPRESSION: 1. Widespread osseous metastases. 2. Epidural tumor at T2 and T9 without significant spinal stenosis. 3. T6 and T8 compression fractures. Electronically Signed   By: Sebastian Ache M.D.   On: 11/11/2022 18:34   CT CHEST WO CONTRAST  Result Date: 11/11/2022 CLINICAL DATA:  Breast cancer staging.  * Tracking Code: BO * EXAM: CT CHEST WITHOUT CONTRAST TECHNIQUE: Multidetector CT imaging of the chest was performed following the standard protocol without IV contrast. RADIATION DOSE REDUCTION: This exam was performed according to the departmental dose-optimization program which includes automated exposure control, adjustment of the mA and/or kV according to patient size and/or use of iterative reconstruction technique. COMPARISON:  None Available. FINDINGS: Cardiovascular: The heart is markedly enlarged. No substantial  pericardial effusion. Ascending thoracic aorta measures on the order of 4.2 cm diameter. Descending thoracic aorta measures 3.8 cm diameter. Enlargement of the pulmonary outflow tract/main pulmonary arteries suggests pulmonary arterial hypertension. There is moderate atherosclerotic calcification of the abdominal aorta without aneurysm. Mediastinum/Nodes: 1.6 cm nodule identified right thyroid lobe. Upper normal mediastinal lymph nodes evident although assessment limited by lack of mediastinal fat and lack of intravenous contrast material. No evidence for gross hilar lymphadenopathy although assessment is limited by the lack of intravenous contrast on the current study. The esophagus has normal imaging features. No right axillary lymphadenopathy. Left axilla incompletely visualized. 10 mm short axis left subpectoral lymph node seen on image 49/2 with probable greater than 18 mm short axis left axillary node on 76/2, incompletely visualized. Left breast is been incompletely visualized with greater than 3 cm homogeneous well-defined incompletely visualized structure noted on image 110/2. Lungs/Pleura: Centrilobular emphsyema noted. Perifissural nodule measuring 4 mm noted right lung on 60/4 with additional 4-5 mm nodules in the right lung on images 68 and 89 of series 4. There is dependent collapse/consolidation in both lower lungs with small bilateral pleural effusions. Upper Abdomen: Unremarkable on noncontrast imaging. Musculoskeletal: Multiple lytic bone metastases evident in the thoracic spine and ribs including posterior right second rib on image 3/series 2. Metastatic lesion in the posterior T2 vertebral body erodes the posterior cortex. 3.1 cm soft tissue lesion in the T9 vertebral body destroys the right pedicle and posterior cortex of the vertebral body with tumor extension into the spinal canal (see image 68/2). Chronic  fracture nonunion noted in the right clavicle pathologic fracture noted posterior right  ninth and tenth ribs as well as the posterior left twelfth in tenth ribs. Multiple nonacute left-sided rib fractures evident. IMPRESSION: 1. Multiple lytic bone metastases in the thoracic spine and ribs. 3.1 cm soft tissue lesion in the T9 vertebral body destroys the right pedicle and posterior cortex of the vertebral body with tumor extension into the spinal canal. MRI of the thoracic spine with and without contrast recommended to further evaluate. 2. Metastatic lesion in the T2 vertebral body destroys the posterior cortex on sagittal imaging suggests extension in the anterior spinal canal. 3. Pathologic fractures of the posterior right ninth and tenth ribs as well as the posterior left twelfth and tenth ribs. Innumerable thoracic spine and rib metastases evident. 4. Left axillary and subpectoral lymphadenopathy, incompletely visualized. 5. 1.6 cm right thyroid lobe nodule. In the setting of significant comorbidities or limited life expectancy, no follow-up recommended (ref: J Am Coll Radiol. 2015 Feb;12(2): 143-50). 6. Small bilateral pleural effusions with dependent collapse/consolidation in both lower lungs. 7. Tiny right lung nodules.  Metastatic disease not excluded. 8. Aortic Atherosclerosis (ICD10-I70.0) and Emphysema (ICD10-J43.9). Electronically Signed   By: Kennith Center M.D.   On: 11/11/2022 10:29   CT ABDOMEN PELVIS W CONTRAST  Result Date: 11/09/2022 CLINICAL DATA:  Acute abdominal pain EXAM: CT ABDOMEN AND PELVIS WITH CONTRAST TECHNIQUE: Multidetector CT imaging of the abdomen and pelvis was performed using the standard protocol following bolus administration of intravenous contrast. RADIATION DOSE REDUCTION: This exam was performed according to the departmental dose-optimization program which includes automated exposure control, adjustment of the mA and/or kV according to patient size and/or use of iterative reconstruction technique. CONTRAST:  75mL OMNIPAQUE IOHEXOL 300 MG/ML  SOLN COMPARISON:   None Available. FINDINGS: Lower chest: T9 vertebral body lytic metastasis. Multiple lytic rib metastases. 3.1 cm soft tissue mass left lateral body wall. No pleural or pericardial effusion. Dependent atelectasis in the lung bases. 3.7 cm cystic lesion in the left breast. Hepatobiliary: Subcentimeter calcified stone in the dependent aspect of the nondilated gallbladder. No focal liver lesion or biliary ductal dilatation. Pancreas: Unremarkable. No pancreatic ductal dilatation or surrounding inflammatory changes. Spleen: Normal in size without focal abnormality. Adrenals/Urinary Tract: No adrenal mass. Symmetric renal parenchymal enhancement without focal lesion or evident urolithiasis. Urinary bladder nondistended. Stomach/Bowel: Stomach decompressed. Small bowel nondilated. Normal appendix. The colon is partially distended, without acute finding. Vascular/Lymphatic: Moderate scattered calcified aortoiliac atheromatous plaque without aneurysm or stenosis. Portal vein patent. No abdominal or pelvic adenopathy. Reproductive: Status post hysterectomy. No adnexal masses. Other: No ascites.  No free air. Musculoskeletal: Multiple lytic bone lesions throughout the lumbosacral spine, left ischium, bilateral iliac bones. Lytic lesion in the inferior left ischial tuberosity with pathologic fracture, minimally displaced. Pathologic compression deformity of L5 with a greater than 50% loss of height. Mild compression deformity of T12. fixation hardware in the posterior left acetabulum. IMPRESSION: 1. Multiple lytic bone lesions throughout the lumbosacral spine, left ischium, bilateral iliac bones, and ribs. Pathologic fractures of the inferior left ischial tuberosity, T12 and L5 vertebral bodies. 2. 3.1 cm soft tissue mass left lateral body wall. 3. 3.7 cm cystic lesion in the left breast. 4. Cholelithiasis. 5.  Aortic Atherosclerosis (ICD10-I70.0). Electronically Signed   By: Corlis Leak M.D.   On: 11/09/2022 13:53   CT Head  Wo Contrast  Result Date: 11/09/2022 CLINICAL DATA:  Mental status change with unknown cause. Neck trauma EXAM: CT HEAD WITHOUT  CONTRAST CT CERVICAL SPINE WITHOUT CONTRAST TECHNIQUE: Multidetector CT imaging of the head and cervical spine was performed following the standard protocol without intravenous contrast. Multiplanar CT image reconstructions of the cervical spine were also generated. RADIATION DOSE REDUCTION: This exam was performed according to the departmental dose-optimization program which includes automated exposure control, adjustment of the mA and/or kV according to patient size and/or use of iterative reconstruction technique. COMPARISON:  Brain MRI 01/07/2021 FINDINGS: CT HEAD FINDINGS Brain: No evidence of acute infarction, hemorrhage, hydrocephalus, extra-axial collection or mass lesion/mass effect. Small right cerebellar infarct, chronic by prior MRI. Generalized brain atrophy. Vascular: No hyperdense vessel or unexpected calcification. Skull: Multiple lytic lesions scattered throughout the calvarium. Parasagittal right frontal and parasagittal left parietal deposits erode the inner table. Additional notable lesion in the left sphenoid wing eroding the inner table. Sinuses/Orbits: No acute finding CT CERVICAL SPINE FINDINGS Alignment: Normal Skull base and vertebrae: No acute fracture. Multiple lytic bone lesions seen in the C3, C6, C7, T1, T2, and T3 bodies. Notable posterior element deposits at T1-T4. Partially covered nonacute right clavicle fracture with bulky callus that could obscure underlying lesion. Soft tissues and spinal canal: No prevertebral fluid or swelling. No visible canal hematoma. Disc levels:  Ordinary degenerative spurring. Upper chest: Sizable metastatic deposit at the right second rib posteriorly. IMPRESSION: Widespread osseous metastatic disease including deposits that erodes through the inner table. No acute intracranial finding.  No visible brain metastasis.  Electronically Signed   By: Tiburcio Pea M.D.   On: 11/09/2022 11:45   CT Cervical Spine Wo Contrast  Result Date: 11/09/2022 CLINICAL DATA:  Mental status change with unknown cause. Neck trauma EXAM: CT HEAD WITHOUT CONTRAST CT CERVICAL SPINE WITHOUT CONTRAST TECHNIQUE: Multidetector CT imaging of the head and cervical spine was performed following the standard protocol without intravenous contrast. Multiplanar CT image reconstructions of the cervical spine were also generated. RADIATION DOSE REDUCTION: This exam was performed according to the departmental dose-optimization program which includes automated exposure control, adjustment of the mA and/or kV according to patient size and/or use of iterative reconstruction technique. COMPARISON:  Brain MRI 01/07/2021 FINDINGS: CT HEAD FINDINGS Brain: No evidence of acute infarction, hemorrhage, hydrocephalus, extra-axial collection or mass lesion/mass effect. Small right cerebellar infarct, chronic by prior MRI. Generalized brain atrophy. Vascular: No hyperdense vessel or unexpected calcification. Skull: Multiple lytic lesions scattered throughout the calvarium. Parasagittal right frontal and parasagittal left parietal deposits erode the inner table. Additional notable lesion in the left sphenoid wing eroding the inner table. Sinuses/Orbits: No acute finding CT CERVICAL SPINE FINDINGS Alignment: Normal Skull base and vertebrae: No acute fracture. Multiple lytic bone lesions seen in the C3, C6, C7, T1, T2, and T3 bodies. Notable posterior element deposits at T1-T4. Partially covered nonacute right clavicle fracture with bulky callus that could obscure underlying lesion. Soft tissues and spinal canal: No prevertebral fluid or swelling. No visible canal hematoma. Disc levels:  Ordinary degenerative spurring. Upper chest: Sizable metastatic deposit at the right second rib posteriorly. IMPRESSION: Widespread osseous metastatic disease including deposits that erodes  through the inner table. No acute intracranial finding.  No visible brain metastasis. Electronically Signed   By: Tiburcio Pea M.D.   On: 11/09/2022 11:45   DG Chest 2 View  Result Date: 11/09/2022 CLINICAL DATA:  Chest pain.  Back and foot wounds. EXAM: CHEST - 2 VIEW COMPARISON:  01/06/2021 FINDINGS: Moderate cardiomegaly again noted. Ectasia and atherosclerotic calcification of the thoracic aorta again seen. Both lungs remain clear. No  evidence of pneumothorax or pleural effusion. Several displaced left lateral rib fracture deformities are seen several displaced rib fractures are seen involving the left lateral 4th through 9th ribs, at least 1 of which shows periosteal new bone formation. IMPRESSION: Moderate cardiomegaly. No active lung disease. Multiple left lateral rib fractures, at least 1 of which shows periosteal new bone formation. Electronically Signed   By: Danae Orleans M.D.   On: 11/09/2022 11:16    Assessment and plan-   # Metastatic breast cancer with extensive bone metastasis, including epidural tumor at T2 and T9, compression fracture at T6 and T8. Pathology results were reviewed and discussed with patient's sisters. I recommend palliative radiation to her thoracic spine epidural tumor. Breast cancer is ER positive, PR and HER2 staining is pending. If patient has ER positive, HER2 negative disease, endocrine therapy could be considered. Prognosis is poor due to patient's debilitation and advanced age. Discussed about alternative of no treatment and initiate hospice. Patient's power of attorney sister is interested in the option of palliative radiation  She is not ready to initiate hospice without trying anything.  She understands that treatment is with palliative intent.  Family will initiate hospice if patient is not able to tolerate or does not cooperate with palliative radiation.  Sister is undecided regarding endocrine therapy and we will have to wait for additional  immunohistochemistry staining anyways.  She will follow-up outpatient with me at the cancer center to review results.  She will also need to establish care with radiation oncology for evaluation of outpatient radiation. Palliative care service was consulted.  # Normocytic anemia, hemoglobin 7.  She has microcytic orders iron panel is not consistent with iron deficiency anemia. Check B12 and folate retic panel.. She has increased nRBC Could be due to marrow involvement  Please transfuse PRBC to keep hemoglobin above 7.  Thank you for allowing me to participate in the care of this patient  Rickard Patience, MD, PhD Hematology Oncology 11/14/2022

## 2022-11-14 NOTE — TOC Initial Note (Signed)
Transition of Care [facility]) - Initial/Assessment Note    Patient Details  Name: Cassandra Patterson MRN: [id number] Date of Birth: 07/21/1928  Transition of [facility]) CM/SW Contact:    Erin Sons, LCSW Phone Number: 11/14/2022, 1:34 PM  Clinical Narrative:                  CSW called pt's sister to discuss disposition. Sister confirms plan for pt to go to SNF for short term rehab. CSW explained SNF and insurance auth processes. Fl2 completed and bed requests sent in hub. TOC will follow up with bed offers.   Expected Discharge Plan: Skilled Nursing Facility Barriers to Discharge: Continued Medical Work up, SNF Pending bed offer, Insurance Authorization   Patient Goals and CMS Choice Patient states their goals for this hospitalization and ongoing recovery are:: TBD          Expected Discharge Plan and Services                                              Prior Living Arrangements/Services     Patient language and need for interpreter reviewed:: Yes Do you feel safe going back to the place where you live?: Yes      Need for Family Participation in Patient Care: Yes (Comment) Care giver support system in place?: Yes (comment) Current home services: DME    Activities of Daily Living Home Assistive Devices/Equipment: Walker (specify type), Bedside commode/3-in-1 ADL Screening (condition at time of admission) Is the patient deaf or have difficulty hearing?: Yes Does the patient have difficulty seeing, even when wearing glasses/contacts?: Yes Does the patient have difficulty concentrating, remembering, or making decisions?: Yes  Permission Sought/Granted                  Emotional Assessment       Orientation: : Oriented to Self Alcohol / Substance Use: Not Applicable Psych Involvement: No (comment)  Admission diagnosis:  Weakness [R53.1] Malignant neoplasm metastatic to bone (HCC) [C79.51] Altered mental status, unspecified altered mental status  type [R41.82] Patient Active Problem List   Diagnosis Date Noted   Palliative care encounter 11/14/2022   Malignant neoplasm metastatic to bone (HCC) 11/10/2022   Normocytic anemia 11/10/2022   Pressure injury of skin 11/10/2022   Weakness 11/09/2022   Severe protein-calorie malnutrition (HCC) 11/09/2022   AKI (acute kidney injury) (HCC) 11/09/2022   Lytic bone lesion of hip 11/09/2022   Dry skin 11/09/2022   History of TIA (transient ischemic attack) 05/01/2021   TIA (transient ischemic attack) 01/06/2021   Mobitz type 1 second degree AV block 02/22/2020   Palpitations 01/26/2020   Heart murmur 01/26/2020   Essential hypertension 01/26/2020   Bradycardia 01/26/2020   First degree AV block 01/26/2020   PCP:  Leanna Sato, MD Pharmacy:   [id number] [place], [place] - [address] AT [facility] [address] [place] [address] Phone: [phone] Fax: [phone]     Social Determinants of Health (SDOH) Social History: SDOH Screenings   Food Insecurity: Patient Unable To Answer (11/09/2022)  Housing: Patient Unable To Answer (11/09/2022)  Transportation Needs: Patient Unable To Answer (11/09/2022)  Utilities: Patient Unable To Answer (11/09/2022)  Tobacco Use: Low Risk  (11/09/2022)   SDOH Interventions:     Readmission Risk Interventions  No data to display

## 2022-11-14 NOTE — [place] FL2 (Signed)
[place] MEDICAID FL2 LEVEL OF CARE FORM     IDENTIFICATION  Patient Name: Cassandra Patterson Birthdate: 1928-05-01 Sex: female Admission Date (Current Location): 11/09/2022  [facility] and [place] Number:  Producer, television/film/video and Address:  [facility], [address], [place], [place] [address]      Provider Number: [id number]  Attending Physician Name and Address:  Darlin Priestly, MD  Relative Name and Phone Number:       Current Level of Care: Hospital Recommended Level of Care: Skilled Nursing Facility Prior Approval Number:    Date Approved/Denied:   PASRR Number: [id number]  Discharge Plan: SNF    Current Diagnoses: Patient Active Problem List   Diagnosis Date Noted   Palliative care encounter 11/14/2022   Malignant neoplasm metastatic to bone (HCC) 11/10/2022   Normocytic anemia 11/10/2022   Pressure injury of skin 11/10/2022   Weakness 11/09/2022   Severe protein-calorie malnutrition (HCC) 11/09/2022   AKI (acute kidney injury) (HCC) 11/09/2022   Lytic bone lesion of hip 11/09/2022   Dry skin 11/09/2022   History of TIA (transient ischemic attack) 05/01/2021   TIA (transient ischemic attack) 01/06/2021   Mobitz type 1 second degree AV block 02/22/2020   Palpitations 01/26/2020   Heart murmur 01/26/2020   Essential hypertension 01/26/2020   Bradycardia 01/26/2020   First degree AV block 01/26/2020    Orientation RESPIRATION BLADDER Height & Weight     Self, Place  Normal Incontinent, External catheter Weight: 82 lb (37.2 kg) Height:  5\' 3"  (160 cm)  BEHAVIORAL SYMPTOMS/MOOD NEUROLOGICAL BOWEL NUTRITION STATUS      Incontinent Diet (see d/c summary)  AMBULATORY STATUS COMMUNICATION OF NEEDS Skin   Extensive Assist Verbally PU Stage and Appropriate Care, Other (Comment) (laceration left heel; pressure injury buttocks stage 3; pressure injury sacrum stage 3)                       Personal Care Assistance Level of Assistance   Bathing, Feeding, Dressing Bathing Assistance: Maximum assistance Feeding assistance: Independent Dressing Assistance: Maximum assistance     Functional Limitations Info  Sight, Hearing, Speech Sight Info: Impaired Hearing Info: Impaired Speech Info: Adequate    SPECIAL CARE FACTORS FREQUENCY  OT (By licensed OT), PT (By licensed PT)     PT Frequency: 5x/week OT Frequency: 5x/week            Contractures Contractures Info: Not present    Additional Factors Info  Code Status, Allergies Code Status Info: DNR-limited Allergies Info: no known allergies           Current Medications (11/14/2022):  This is the current hospital active medication list Current Facility-Administered Medications  Medication Dose Route Frequency Provider Last Rate Last Admin   0.9 %  sodium chloride infusion   Intravenous Continuous Kennieth Francois, PA 10 mL/hr at 11/12/22 1251 New Bag at 11/12/22 1251   acetaminophen (TYLENOL) tablet 650 mg  650 mg Oral Q6H PRN Cox, Amy N, DO   650 mg at 11/11/22 1647   Or   acetaminophen (TYLENOL) suppository 650 mg  650 mg Rectal Q6H PRN Cox, Amy N, DO       ascorbic acid (VITAMIN C) tablet 500 mg  500 mg Oral BID Charise Killian, MD   500 mg at 11/14/22 1008   aspirin EC tablet 81 mg  81 mg Oral Daily Cox, Amy N, DO   81 mg at 11/14/22 1009  brimonidine (ALPHAGAN) 0.15 % ophthalmic solution 1 drop  1 drop Both Eyes BID Cox, Amy N, DO   1 drop at 11/14/22 1010   calcitonin (salmon) (MIACALCIN/FORTICAL) nasal spray 1 spray  1 spray Alternating Nares Daily Cox, Amy N, DO   1 spray at 11/14/22 1030   cholecalciferol (VITAMIN D3) tablet 1,000 Units  1,000 Units Oral Daily Cox, Amy N, DO   1,000 Units at 11/14/22 1009   enoxaparin (LOVENOX) injection 30 mg  30 mg Subcutaneous QHS Cox, Amy N, DO   30 mg at 11/13/22 2124   feeding supplement (ENSURE ENLIVE / ENSURE PLUS) liquid 237 mL  237 mL Oral BID BM Charise Killian, MD   237 mL at 11/14/22 1011    felodipine (PLENDIL) 24 hr tablet 5 mg  5 mg Oral Daily Cox, Amy N, DO   5 mg at 11/14/22 1011   hydrALAZINE (APRESOLINE) injection 5 mg  5 mg Intravenous Q8H PRN Cox, Amy N, DO       hydrocerin (EUCERIN) cream 1 Application  1 Application Topical BID Cox, Amy N, DO   1 Application at 11/14/22 1010   latanoprost (XALATAN) 0.005 % ophthalmic solution 1 drop  1 drop Left Eye QHS Cox, Amy N, DO   1 drop at 11/13/22 2132   Latanoprostene Bunod 0.024 % SOLN 1 drop  1 drop Ophthalmic QHS Cox, Amy N, DO   1 drop at 11/13/22 2132   multivitamin with minerals tablet 1 tablet  1 tablet Oral Daily Cox, Amy N, DO   1 tablet at 11/14/22 1009   ondansetron (ZOFRAN) tablet 4 mg  4 mg Oral Q6H PRN Cox, Amy N, DO       Or   ondansetron (ZOFRAN) injection 4 mg  4 mg Intravenous Q6H PRN Cox, Amy N, DO       senna-docusate (Senokot-S) tablet 1 tablet  1 tablet Oral QHS PRN Cox, Amy N, DO       sodium bicarbonate tablet 650 mg  650 mg Oral BID Charise Killian, MD   650 mg at 11/14/22 1008   zinc sulfate capsule 220 mg  220 mg Oral Daily Charise Killian, MD   220 mg at 11/14/22 1011     Discharge Medications: Please see discharge summary for a list of discharge medications.  Relevant Imaging Results:  Relevant Lab Results:   Additional Information SSN [id number] [place], [place]

## 2022-11-14 NOTE — Plan of Care (Signed)
Patient progressing well today.  Has been interactive and eating with family.

## 2022-11-14 NOTE — Consult Note (Signed)
Palliative Medicine [facility] at [facility] [phone] [phone]   Name: Cassandra Patterson Date: 11/14/2022 MRN: [id number]  DOB: 09/18/1928  Patient Care Team: Leanna Sato, MD as PCP - General (Family Medicine) End, Cristal Deer, MD as PCP - Cardiology (Cardiology)    REASON FOR CONSULTATION: Cassandra Patterson is a 89 y.o. female with multiple medical problems including history of TIA, hypertension, and first-degree heart block, who presented to the hospital for evaluation of weakness.  CTs reveal widespread osseous metastatic disease with pathologic fractures of the inferior left ischial tuberosity, teeth 12, and L5 vertebral bodies.  Patient also found to have a large palpable breast mass and underwent biopsy of same.  Palliative care was consulted to address goals and manage ongoing symptoms.  SOCIAL HISTORY:     reports that she has never smoked. She has never used smokeless tobacco. She reports that she does not drink alcohol and does not use drugs.  Patient is widowed since 2020.  She has no children.  She lives at home alone and her sister, Cassandra Patterson, lives next-door.  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  ADVANCE DIRECTIVES:  On file  CODE STATUS: DNR  PAST MEDICAL HISTORY: Past Medical History:  Diagnosis Date   Cataracts, bilateral    Glaucoma    Hypertension    Mobitz type 1 second degree atrioventricular block    TIA (transient ischemic attack)     PAST SURGICAL HISTORY:  Past Surgical History:  Procedure Laterality Date   CATARACT EXTRACTION      HEMATOLOGY/ONCOLOGY HISTORY:  Oncology History   No history exists.    ALLERGIES:  has No Known Allergies.  MEDICATIONS:  Current Facility-Administered Medications  Medication Dose Route Frequency Provider Last Rate Last Admin   0.9 %  sodium chloride infusion   Intravenous Continuous Kennieth Francois, PA 10 mL/hr at 11/12/22 1251 New Bag at  11/12/22 1251   acetaminophen (TYLENOL) tablet 650 mg  650 mg Oral Q6H PRN Cox, Amy N, DO   650 mg at 11/11/22 1647   Or   acetaminophen (TYLENOL) suppository 650 mg  650 mg Rectal Q6H PRN Cox, Amy N, DO       ascorbic acid (VITAMIN C) tablet 500 mg  500 mg Oral BID Charise Killian, MD   500 mg at 11/14/22 1008   aspirin EC tablet 81 mg  81 mg Oral Daily Cox, Amy N, DO   81 mg at 11/14/22 1009   brimonidine (ALPHAGAN) 0.15 % ophthalmic solution 1 drop  1 drop Both Eyes BID Cox, Amy N, DO   1 drop at 11/14/22 1010   calcitonin (salmon) (MIACALCIN/FORTICAL) nasal spray 1 spray  1 spray Alternating Nares Daily Cox, Amy N, DO   1 spray at 11/14/22 1030   cholecalciferol (VITAMIN D3) tablet 1,000 Units  1,000 Units Oral Daily Cox, Amy N, DO   1,000 Units at 11/14/22 1009   enoxaparin (LOVENOX) injection 30 mg  30 mg Subcutaneous QHS Cox, Amy N, DO   30 mg at 11/13/22 2124   feeding supplement (ENSURE ENLIVE / ENSURE PLUS) liquid 237 mL  237 mL Oral BID BM Charise Killian, MD   237 mL at 11/14/22 1011   felodipine (PLENDIL) 24 hr tablet 5 mg  5 mg Oral Daily Cox, Amy N, DO   5 mg at 11/14/22 1011   hydrALAZINE (APRESOLINE) injection 5 mg  5 mg Intravenous Q8H PRN Cox,  Amy N, DO       hydrocerin (EUCERIN) cream 1 Application  1 Application Topical BID Cox, Amy N, DO   1 Application at 11/14/22 1010   latanoprost (XALATAN) 0.005 % ophthalmic solution 1 drop  1 drop Left Eye QHS Cox, Amy N, DO   1 drop at 11/13/22 2132   Latanoprostene Bunod 0.024 % SOLN 1 drop  1 drop Ophthalmic QHS Cox, Amy N, DO   1 drop at 11/13/22 2132   multivitamin with minerals tablet 1 tablet  1 tablet Oral Daily Cox, Amy N, DO   1 tablet at 11/14/22 1009   ondansetron (ZOFRAN) tablet 4 mg  4 mg Oral Q6H PRN Cox, Amy N, DO       Or   ondansetron (ZOFRAN) injection 4 mg  4 mg Intravenous Q6H PRN Cox, Amy N, DO       senna-docusate (Senokot-S) tablet 1 tablet  1 tablet Oral QHS PRN Cox, Amy N, DO       sodium bicarbonate  tablet 650 mg  650 mg Oral BID Charise Killian, MD   650 mg at 11/14/22 1008   zinc sulfate capsule 220 mg  220 mg Oral Daily Charise Killian, MD   220 mg at 11/14/22 1011    VITAL SIGNS: BP (!) 143/71 (BP Location: Right Arm)   Pulse (!) 55   Temp 99.5 F (37.5 C) (Oral)   Resp 16   Ht 5\' 3"  (1.6 m)   Wt 82 lb (37.2 kg)   SpO2 96%   BMI 14.53 kg/m  Filed Weights   11/09/22 1053  Weight: 82 lb (37.2 kg)    Estimated body mass index is 14.53 kg/m as calculated from the following:   Height as of this encounter: 5\' 3"  (1.6 m).   Weight as of this encounter: 82 lb (37.2 kg).  LABS: CBC:    Component Value Date/Time   WBC 6.1 11/14/2022 0722   HGB 8.6 (L) 11/14/2022 0722   HCT 29.2 (L) 11/14/2022 0722   PLT 235 11/14/2022 0722   MCV 79.1 (L) 11/14/2022 0722   NEUTROABS 3.2 01/06/2021 2029   LYMPHSABS 1.2 01/06/2021 2029   MONOABS 0.4 01/06/2021 2029   EOSABS 0.2 01/06/2021 2029   BASOSABS 0.0 01/06/2021 2029   Comprehensive Metabolic Panel:    Component Value Date/Time   NA 142 11/14/2022 0722   K 4.2 11/14/2022 0722   CL 113 (H) 11/14/2022 0722   CO2 22 11/14/2022 0722   BUN 24 (H) 11/14/2022 0722   CREATININE 0.82 11/14/2022 0722   GLUCOSE 84 11/14/2022 0722   CALCIUM 8.4 (L) 11/14/2022 0722   AST 35 11/12/2022 0704   ALT 19 11/12/2022 0704   ALKPHOS 137 (H) 11/12/2022 0704   BILITOT 0.6 11/12/2022 0704   PROT 5.3 (L) 11/12/2022 0704   ALBUMIN 2.4 (L) 11/12/2022 0704    RADIOGRAPHIC STUDIES: CT ASPIRATION N/S  Result Date: 11/12/2022 INDICATION: 89 year old female referred for biopsy L5 vertebral body, with inability to position on left decubitus or prone. We will proceed after speaking with oncology team in the patient's family with a left chest wall/breast mass biopsy EXAM: CT BIOPSY PUNCTURE ASPIRATION SUPERFICIAL FLUID MEDICATIONS: None. ANESTHESIA/SEDATION: Moderate (conscious) sedation was not employed during this procedure. A total of Versed  0.5 mg and Fentanyl 0 mcg was administered intravenously. Moderate Sedation Time: 0 minutes. The patient's level of consciousness and vital signs were monitored continuously by radiology nursing throughout the procedure under my  direct supervision. FLUOROSCOPY TIME:  CT COMPLICATIONS: None PROCEDURE: Informed written consent was obtained from the patient after a thorough discussion of the procedural risks, benefits and alternatives. All questions were addressed. Maximal Sterile Barrier Technique was utilized including caps, mask, sterile gowns, sterile gloves, sterile drape, hand hygiene and skin antiseptic. A timeout was performed prior to the initiation of the procedure. Patient was positioned supine on the CT gantry table. Scout CT of the chest performed for planning purposes. The patient is prepped and draped in the usual sterile fashion. 1% lidocaine was used for local anesthesia. Using CT guidance, guide needle was advanced into the solid mass of the left chest wall/breast. Once we confirmed needle tip position multiple 18 gauge core biopsy were acquired. Needle was removed. Final CT was acquired. Manual pressure was used for hemostasis. Sterile bandage was placed. Patient tolerated the procedure well and remained hemodynamically stable throughout. No complications were encountered and no significant blood loss. IMPRESSION: Status post CT-guided biopsy of left chest wall mass/breast mass. Signed, Yvone Neu. Miachel Roux, RPVI [facility] Radiology Electronically Signed   By: Gilmer Mor D.O.   On: 11/12/2022 16:07   MR THORACIC SPINE W WO CONTRAST  Result Date: 11/11/2022 CLINICAL DATA:  Metastatic disease evaluation.  Weakness. EXAM: MRI THORACIC WITHOUT AND WITH CONTRAST TECHNIQUE: Multiplanar and multiecho pulse sequences of the thoracic spine were obtained without and with intravenous contrast. CONTRAST:  4mL GADAVIST GADOBUTROL 1 MMOL/ML IV SOLN  COMPARISON:  CT chest 11/10/2022 FINDINGS: Multiple sequences are moderately motion degraded. Alignment: Increased thoracic kyphosis. Thoracic dextroscoliosis. No significant listhesis. Vertebrae: Widespread enhancing lesions throughout the thoracic spine as well as the cervical and included upper lumbar spine. Large destructive lesion involving the right aspect of the T9 vertebral body and pedicle as seen on CT with right-sided ventral and lateral epidural tumor not resulting in significant spinal stenosis or spinal cord mass effect. Small volume left-sided ventral epidural tumor at T2 without stenosis. T6 and T8 compression fractures with 50% anterior vertebral body height loss. Multiple rib lesions and rib fractures, better demonstrated on the prior CT. Cord:  Normal signal and morphology. Paraspinal and other soft tissues: Small bilateral pleural effusions. Disc levels: Mild thoracic spondylosis. No spinal stenosis. Moderate right neural foraminal encroachment at T9-10 due to tumor. IMPRESSION: 1. Widespread osseous metastases. 2. Epidural tumor at T2 and T9 without significant spinal stenosis. 3. T6 and T8 compression fractures. Electronically Signed   By: Sebastian Ache M.D.   On: 11/11/2022 18:34   CT CHEST WO CONTRAST  Result Date: 11/11/2022 CLINICAL DATA:  Breast cancer staging.  * Tracking Code: BO * EXAM: CT CHEST WITHOUT CONTRAST TECHNIQUE: Multidetector CT imaging of the chest was performed following the standard protocol without IV contrast. RADIATION DOSE REDUCTION: This exam was performed according to the departmental dose-optimization program which includes automated exposure control, adjustment of the mA and/or kV according to patient size and/or use of iterative reconstruction technique. COMPARISON:  None Available. FINDINGS: Cardiovascular: The heart is markedly enlarged. No substantial pericardial effusion. Ascending thoracic aorta measures on the order of 4.2 cm diameter. Descending thoracic  aorta measures 3.8 cm diameter. Enlargement of the pulmonary outflow tract/main pulmonary arteries suggests pulmonary arterial hypertension. There is moderate atherosclerotic calcification of the abdominal aorta without aneurysm. Mediastinum/Nodes: 1.6 cm nodule identified right thyroid lobe. Upper normal mediastinal lymph nodes evident although assessment limited by lack of mediastinal fat and lack of intravenous contrast material. No evidence for  gross hilar lymphadenopathy although assessment is limited by the lack of intravenous contrast on the current study. The esophagus has normal imaging features. No right axillary lymphadenopathy. Left axilla incompletely visualized. 10 mm short axis left subpectoral lymph node seen on image 49/2 with probable greater than 18 mm short axis left axillary node on 76/2, incompletely visualized. Left breast is been incompletely visualized with greater than 3 cm homogeneous well-defined incompletely visualized structure noted on image 110/2. Lungs/Pleura: Centrilobular emphsyema noted. Perifissural nodule measuring 4 mm noted right lung on 60/4 with additional 4-5 mm nodules in the right lung on images 68 and 89 of series 4. There is dependent collapse/consolidation in both lower lungs with small bilateral pleural effusions. Upper Abdomen: Unremarkable on noncontrast imaging. Musculoskeletal: Multiple lytic bone metastases evident in the thoracic spine and ribs including posterior right second rib on image 3/series 2. Metastatic lesion in the posterior T2 vertebral body erodes the posterior cortex. 3.1 cm soft tissue lesion in the T9 vertebral body destroys the right pedicle and posterior cortex of the vertebral body with tumor extension into the spinal canal (see image 68/2). Chronic fracture nonunion noted in the right clavicle pathologic fracture noted posterior right ninth and tenth ribs as well as the posterior left twelfth in tenth ribs. Multiple nonacute left-sided rib  fractures evident. IMPRESSION: 1. Multiple lytic bone metastases in the thoracic spine and ribs. 3.1 cm soft tissue lesion in the T9 vertebral body destroys the right pedicle and posterior cortex of the vertebral body with tumor extension into the spinal canal. MRI of the thoracic spine with and without contrast recommended to further evaluate. 2. Metastatic lesion in the T2 vertebral body destroys the posterior cortex on sagittal imaging suggests extension in the anterior spinal canal. 3. Pathologic fractures of the posterior right ninth and tenth ribs as well as the posterior left twelfth and tenth ribs. Innumerable thoracic spine and rib metastases evident. 4. Left axillary and subpectoral lymphadenopathy, incompletely visualized. 5. 1.6 cm right thyroid lobe nodule. In the setting of significant comorbidities or limited life expectancy, no follow-up recommended (ref: J Am Coll Radiol. 2015 Feb;12(2): 143-50). 6. Small bilateral pleural effusions with dependent collapse/consolidation in both lower lungs. 7. Tiny right lung nodules.  Metastatic disease not excluded. 8. Aortic Atherosclerosis (ICD10-I70.0) and Emphysema (ICD10-J43.9). Electronically Signed   By: Kennith Center M.D.   On: 11/11/2022 10:29   CT ABDOMEN PELVIS W CONTRAST  Result Date: 11/09/2022 CLINICAL DATA:  Acute abdominal pain EXAM: CT ABDOMEN AND PELVIS WITH CONTRAST TECHNIQUE: Multidetector CT imaging of the abdomen and pelvis was performed using the standard protocol following bolus administration of intravenous contrast. RADIATION DOSE REDUCTION: This exam was performed according to the departmental dose-optimization program which includes automated exposure control, adjustment of the mA and/or kV according to patient size and/or use of iterative reconstruction technique. CONTRAST:  75mL OMNIPAQUE IOHEXOL 300 MG/ML  SOLN COMPARISON:  None Available. FINDINGS: Lower chest: T9 vertebral body lytic metastasis. Multiple lytic rib metastases.  3.1 cm soft tissue mass left lateral body wall. No pleural or pericardial effusion. Dependent atelectasis in the lung bases. 3.7 cm cystic lesion in the left breast. Hepatobiliary: Subcentimeter calcified stone in the dependent aspect of the nondilated gallbladder. No focal liver lesion or biliary ductal dilatation. Pancreas: Unremarkable. No pancreatic ductal dilatation or surrounding inflammatory changes. Spleen: Normal in size without focal abnormality. Adrenals/Urinary Tract: No adrenal mass. Symmetric renal parenchymal enhancement without focal lesion or evident urolithiasis. Urinary bladder nondistended. Stomach/Bowel: Stomach decompressed. Small  bowel nondilated. Normal appendix. The colon is partially distended, without acute finding. Vascular/Lymphatic: Moderate scattered calcified aortoiliac atheromatous plaque without aneurysm or stenosis. Portal vein patent. No abdominal or pelvic adenopathy. Reproductive: Status post hysterectomy. No adnexal masses. Other: No ascites.  No free air. Musculoskeletal: Multiple lytic bone lesions throughout the lumbosacral spine, left ischium, bilateral iliac bones. Lytic lesion in the inferior left ischial tuberosity with pathologic fracture, minimally displaced. Pathologic compression deformity of L5 with a greater than 50% loss of height. Mild compression deformity of T12. fixation hardware in the posterior left acetabulum. IMPRESSION: 1. Multiple lytic bone lesions throughout the lumbosacral spine, left ischium, bilateral iliac bones, and ribs. Pathologic fractures of the inferior left ischial tuberosity, T12 and L5 vertebral bodies. 2. 3.1 cm soft tissue mass left lateral body wall. 3. 3.7 cm cystic lesion in the left breast. 4. Cholelithiasis. 5.  Aortic Atherosclerosis (ICD10-I70.0). Electronically Signed   By: Corlis Leak M.D.   On: 11/09/2022 13:53   CT Head Wo Contrast  Result Date: 11/09/2022 CLINICAL DATA:  Mental status change with unknown cause. Neck trauma  EXAM: CT HEAD WITHOUT CONTRAST CT CERVICAL SPINE WITHOUT CONTRAST TECHNIQUE: Multidetector CT imaging of the head and cervical spine was performed following the standard protocol without intravenous contrast. Multiplanar CT image reconstructions of the cervical spine were also generated. RADIATION DOSE REDUCTION: This exam was performed according to the departmental dose-optimization program which includes automated exposure control, adjustment of the mA and/or kV according to patient size and/or use of iterative reconstruction technique. COMPARISON:  Brain MRI 01/07/2021 FINDINGS: CT HEAD FINDINGS Brain: No evidence of acute infarction, hemorrhage, hydrocephalus, extra-axial collection or mass lesion/mass effect. Small right cerebellar infarct, chronic by prior MRI. Generalized brain atrophy. Vascular: No hyperdense vessel or unexpected calcification. Skull: Multiple lytic lesions scattered throughout the calvarium. Parasagittal right frontal and parasagittal left parietal deposits erode the inner table. Additional notable lesion in the left sphenoid wing eroding the inner table. Sinuses/Orbits: No acute finding CT CERVICAL SPINE FINDINGS Alignment: Normal Skull base and vertebrae: No acute fracture. Multiple lytic bone lesions seen in the C3, C6, C7, T1, T2, and T3 bodies. Notable posterior element deposits at T1-T4. Partially covered nonacute right clavicle fracture with bulky callus that could obscure underlying lesion. Soft tissues and spinal canal: No prevertebral fluid or swelling. No visible canal hematoma. Disc levels:  Ordinary degenerative spurring. Upper chest: Sizable metastatic deposit at the right second rib posteriorly. IMPRESSION: Widespread osseous metastatic disease including deposits that erodes through the inner table. No acute intracranial finding.  No visible brain metastasis. Electronically Signed   By: Tiburcio Pea M.D.   On: 11/09/2022 11:45   CT Cervical Spine Wo Contrast  Result  Date: 11/09/2022 CLINICAL DATA:  Mental status change with unknown cause. Neck trauma EXAM: CT HEAD WITHOUT CONTRAST CT CERVICAL SPINE WITHOUT CONTRAST TECHNIQUE: Multidetector CT imaging of the head and cervical spine was performed following the standard protocol without intravenous contrast. Multiplanar CT image reconstructions of the cervical spine were also generated. RADIATION DOSE REDUCTION: This exam was performed according to the departmental dose-optimization program which includes automated exposure control, adjustment of the mA and/or kV according to patient size and/or use of iterative reconstruction technique. COMPARISON:  Brain MRI 01/07/2021 FINDINGS: CT HEAD FINDINGS Brain: No evidence of acute infarction, hemorrhage, hydrocephalus, extra-axial collection or mass lesion/mass effect. Small right cerebellar infarct, chronic by prior MRI. Generalized brain atrophy. Vascular: No hyperdense vessel or unexpected calcification. Skull: Multiple lytic lesions scattered throughout the  calvarium. Parasagittal right frontal and parasagittal left parietal deposits erode the inner table. Additional notable lesion in the left sphenoid wing eroding the inner table. Sinuses/Orbits: No acute finding CT CERVICAL SPINE FINDINGS Alignment: Normal Skull base and vertebrae: No acute fracture. Multiple lytic bone lesions seen in the C3, C6, C7, T1, T2, and T3 bodies. Notable posterior element deposits at T1-T4. Partially covered nonacute right clavicle fracture with bulky callus that could obscure underlying lesion. Soft tissues and spinal canal: No prevertebral fluid or swelling. No visible canal hematoma. Disc levels:  Ordinary degenerative spurring. Upper chest: Sizable metastatic deposit at the right second rib posteriorly. IMPRESSION: Widespread osseous metastatic disease including deposits that erodes through the inner table. No acute intracranial finding.  No visible brain metastasis. Electronically Signed   By:  Tiburcio Pea M.D.   On: 11/09/2022 11:45   DG Chest 2 View  Result Date: 11/09/2022 CLINICAL DATA:  Chest pain.  Back and foot wounds. EXAM: CHEST - 2 VIEW COMPARISON:  01/06/2021 FINDINGS: Moderate cardiomegaly again noted. Ectasia and atherosclerotic calcification of the thoracic aorta again seen. Both lungs remain clear. No evidence of pneumothorax or pleural effusion. Several displaced left lateral rib fracture deformities are seen several displaced rib fractures are seen involving the left lateral 4th through 9th ribs, at least 1 of which shows periosteal new bone formation. IMPRESSION: Moderate cardiomegaly. No active lung disease. Multiple left lateral rib fractures, at least 1 of which shows periosteal new bone formation. Electronically Signed   By: Danae Orleans M.D.   On: 11/09/2022 11:16    PERFORMANCE STATUS (ECOG) : 2 - Symptomatic, <50% confined to bed  Review of Systems Unless otherwise noted, a complete review of systems is negative.  Physical Exam General: NAD Cardiovascular: regular rate and rhythm Pulmonary: clear ant fields Abdomen: soft, nontender, + bowel sounds GU: no suprapubic tenderness Extremities: no edema, no joint deformities Skin: no rashes Neurological: Weakness but otherwise nonfocal  IMPRESSION: I met with patient and her sister, Cassandra Patterson.   Patient and sister verbalized understanding that workup appears consistent with an advanced stage malignancy.  Family is interested in discussing possible options for treatment once pathology has returned.  However, they recognize that patient's age and frailty may limit treatment options.  Prior to this hospitalization, patient was living at home alone but her sister lives next-door and was available to assist with care as needed.  Patient sister is interested in rehab.  I would recommend a palliative care follow her in that setting.  Discussed CODE STATUS.  Patient stated clearly that she would not be interested in  resuscitation nor would she want her life prolonged artificially on machines.  Her sister agree with that decision.  CODE STATUS changed to DNR/DNI.  Patient's sister brought in [employer] documents which I reviewed and support decision for DNR in setting of incurable cancer.  Patient denies any symptomatic complaints or concerns at present.  PLAN: -Continue current scope of treatment -DNR/DNI -Dispo: Probable rehab with palliative care following -Will follow up outpatient once path is known  Time Total: 45 minutes  Visit consisted of counseling and education dealing with the complex and emotionally intense issues of symptom management and palliative care in the setting of serious and potentially life-threatening illness.Greater than 50%  of this time was spent counseling and coordinating care related to the above assessment and plan.  Signed by: Laurette Schimke, PhD, NP-C

## 2022-11-14 NOTE — Progress Notes (Addendum)
PROGRESS NOTE    Cassandra Patterson  YQM:[id number] DOB: 02-Jul-1928 DOA: 11/09/2022 PCP: Leanna Sato, MD  121A/121A-AA  LOS: 5 days   Brief hospital course:   Assessment & Plan: [name] is a 89 year old female with history of Mobitz type I second-degree heart block, history of TIA, history of hypertension, who presents to the emergency department for chief concerns of worsening generalized weakness.   Weakness:  likely secondary to metastatic cancer   Breast cancer with extensive lytic bone lesions --w/ metastatic lesions of lumbosacral spine, left ischium, bilateral iliac bones, ribs, pathologic fractures at the inferior left ischial tuberosity, T12, L5 vertebral bodies.  Large palpable breast mass as per onco. CT abd/pelvis shows 3.1 cm soft tissue mass left lateral body wall, 3.7 cm cystic lesion in the left breast. CT chest shows multiple lytic bone metastases in T spine, ribs, 3.1 cm soft tissue lesion in T9 vertebral body destroys right pedicle  w/ tumor extension into the spinal cord with multiple other findings, see CT impression.   --Protein electrophoresis, light chain ratios pending as per onco.  --IR biopsy on 9/25.  Pathology showed metastatic moderately differentiated adenocarcinoma consistent with breast primary.  Plan: --onc Dr. Cathie Hoops to discuss results with pt and sister today.   AKI: resolved --oral hydration   Normocytic anemia:  --monitor and transfuse to keep Hgb >7 --anemia workup   Severe protein-calorie malnutrition:  nutrition consulted  --supplements per dietician   Hx of TIA: continue on aspirin    HTN:  --cont home felodipine  Pressure injury left buttock stage 3, present on admission Pressure injury sacrum stage 3, present on admission     DVT prophylaxis: Lovenox SQ Code Status: Full code  Family Communication: sister updated at bedside today Level of care: Telemetry Medical Dispo:   The patient is from: home Anticipated  d/c is to: SNF rehab Anticipated d/c date is: whenever bed available   Subjective and Interval History:  Pt had no complaint.   Objective: Vitals:   11/13/22 2147 11/14/22 0459 11/14/22 0800 11/14/22 1644  BP: 131/70 136/67 (!) 143/71 136/61  Pulse: 62 (!) 59 (!) 55 70  Resp: 16 20 16 18   Temp: 98.2 F (36.8 C) 99.5 F (37.5 C) 99.5 F (37.5 C) 99.8 F (37.7 C)  TempSrc: Oral Oral Oral Oral  SpO2: 96% 96% 96% 95%  Weight:      Height:        Intake/Output Summary (Last 24 hours) at 11/14/2022 1939 Last data filed at 11/14/2022 1545 Gross per 24 hour  Intake 600 ml  Output 1200 ml  Net -600 ml   Filed Weights   11/09/22 1053  Weight: 37.2 kg    Examination:   Constitutional: NAD, alert, seemed confused and slow to respond HEENT: conjunctivae and lids normal, EOMI CV: No cyanosis.   RESP: normal respiratory effort, on RA Neuro: II - XII grossly intact.     Data Reviewed: I have personally reviewed labs and imaging studies  Time spent: 35 minutes  Darlin Priestly, MD [facility] If 7PM-7AM, please contact night-coverage 11/14/2022, 7:39 PM

## 2022-11-15 DIAGNOSIS — R531 Weakness: Secondary | ICD-10-CM | POA: Diagnosis not present

## 2022-11-15 MED ORDER — ACETAMINOPHEN 500 MG PO TABS: 1000.0000 mg | ORAL_TABLET | Freq: Three times a day (TID) | ORAL | Status: DC | PRN

## 2022-11-15 NOTE — Progress Notes (Signed)
PROGRESS NOTE    Cassandra Patterson  [id number] DOB: [birth year] DOA: 11/09/2022 PCP: Leanna Sato, MD  121A/121A-AA  LOS: 6 days   Brief hospital course:   Assessment & Plan: [name] is a 89 year old female with history of Mobitz type I second-degree heart block, history of TIA, history of hypertension, who presents to the emergency department for chief concerns of worsening generalized weakness.   Weakness:  likely secondary to metastatic cancer --SNF rehab   Breast cancer with extensive lytic bone lesions --w/ metastatic lesions of lumbosacral spine, left ischium, bilateral iliac bones, ribs, pathologic fractures at the inferior left ischial tuberosity, T12, L5 vertebral bodies.  Large palpable breast mass as per onco. CT abd/pelvis shows 3.1 cm soft tissue mass left lateral body wall, 3.7 cm cystic lesion in the left breast. CT chest shows multiple lytic bone metastases in T spine, ribs, 3.1 cm soft tissue lesion in T9 vertebral body destroys right pedicle  w/ tumor extension into the spinal cord with multiple other findings, see CT impression.   --Protein electrophoresis, light chain ratios pending as per onco.  --IR biopsy on 9/25.  Pathology showed metastatic moderately differentiated adenocarcinoma consistent with breast primary.  --palliative care consulted Plan: --f/u with onc Dr. Cathie Hoops for further management  AKI: resolved --oral hydration   Normocytic anemia:  --monitor and transfuse to keep Hgb >7 --anemia workup   Severe protein-calorie malnutrition:  nutrition consulted  --supplements per dietician   Hx of TIA: continue on aspirin    HTN:  --cont home felodipine  Pressure injury left buttock stage 3, present on admission Pressure injury sacrum stage 3, present on admission     DVT prophylaxis: Lovenox SQ Code Status: Full code  Family Communication:  Level of care: Telemetry Medical Dispo:   The patient is from: home Anticipated d/c  is to: SNF rehab Anticipated d/c date is: whenever bed available   Subjective and Interval History:  No change today.   Objective: Vitals:   11/14/22 1644 11/14/22 2026 11/15/22 0803 11/15/22 1618  BP: 136/61 (!) 142/79 138/88 (!) 157/88  Pulse: 70 88 100 89  Resp: 18 20 15 20   Temp: 99.8 F (37.7 C) 99.5 F (37.5 C) 98.6 F (37 C) 98.8 F (37.1 C)  TempSrc: Oral     SpO2: 95% 98% 98% 98%  Weight:      Height:        Intake/Output Summary (Last 24 hours) at 11/15/2022 1724 Last data filed at 11/15/2022 1621 Gross per 24 hour  Intake --  Output 900 ml  Net -900 ml   Filed Weights   11/09/22 1053  Weight: 37.2 kg    Examination:   Constitutional: NAD, alert HEENT: conjunctivae and lids normal, EOMI CV: No cyanosis.   RESP: normal respiratory effort, on RA   Data Reviewed: I have personally reviewed labs and imaging studies  Time spent: 25 minutes  Darlin Priestly, MD [facility] If 7PM-7AM, please contact night-coverage 11/15/2022, 5:24 PM

## 2022-11-16 DIAGNOSIS — R531 Weakness: Secondary | ICD-10-CM | POA: Diagnosis not present

## 2022-11-16 LAB — FOLATE: Folate: 23 ng/mL (ref >5.9)

## 2022-11-16 MED ORDER — ACETAMINOPHEN 500 MG PO TABS: 500.0000 mg | ORAL_TABLET | Freq: Four times a day (QID) | ORAL | Status: DC | PRN

## 2022-11-16 NOTE — Plan of Care (Signed)
  Problem: Education: Goal: Knowledge of General Education information will improve Description: Including pain rating scale, medication(s)/side effects and non-pharmacologic comfort measures Outcome: Progressing   Problem: Clinical Measurements: Goal: Respiratory complications will improve Outcome: Progressing   Problem: Coping: Goal: Level of anxiety will decrease Outcome: Progressing   Problem: Elimination: Goal: Will not experience complications related to urinary retention Outcome: Progressing   

## 2022-11-16 NOTE — Plan of Care (Signed)
  Problem: Education: Goal: Knowledge of General Education information will improve Description: Including pain rating scale, medication(s)/side effects and non-pharmacologic comfort measures Outcome: Progressing   Problem: Skin Integrity: Goal: Risk for impaired skin integrity will decrease Outcome: Progressing   Problem: Safety: Goal: Ability to remain free from injury will improve Outcome: Progressing

## 2022-11-16 NOTE — Progress Notes (Signed)
PROGRESS NOTE    [name]  [id number]:[id number] DOB: August 04, 1928 DOA: 11/09/2022 PCP: Leanna Sato, MD  121A/121A-AA  LOS: 7 days   Brief hospital course:   Assessment & Plan: [name] is a 89 year old female with history of Mobitz type I second-degree heart block, history of TIA, history of hypertension, who presents to the emergency department for chief concerns of worsening generalized weakness.   Weakness:  likely secondary to metastatic cancer --SNF rehab   Breast cancer with extensive lytic bone lesions --w/ metastatic lesions of lumbosacral spine, left ischium, bilateral iliac bones, ribs, pathologic fractures at the inferior left ischial tuberosity, T12, L5 vertebral bodies.  Large palpable breast mass as per onco. CT abd/pelvis shows 3.1 cm soft tissue mass left lateral body wall, 3.7 cm cystic lesion in the left breast. CT chest shows multiple lytic bone metastases in T spine, ribs, 3.1 cm soft tissue lesion in T9 vertebral body destroys right pedicle  w/ tumor extension into the spinal cord with multiple other findings, see CT impression.   --Protein electrophoresis, light chain ratios pending as per onco.  --IR biopsy on 9/25.  Pathology showed metastatic moderately differentiated adenocarcinoma consistent with breast primary.  --palliative care consulted Plan: --f/u with onc Dr. Cathie Hoops for further management  AKI: resolved --Cr 1.27 on presentation.  Improved to 0.97 next day with IVF. --oral hydration now   Normocytic anemia:  --monitor and transfuse to keep Hgb >7 --anemia workup   Severe protein-calorie malnutrition:  nutrition consulted  --supplements per dietician   Hx of TIA:  --cont ASA   HTN:  --cont home felodipine  Pressure injury left buttock stage 3, present on admission Pressure injury sacrum stage 3, present on admission     DVT prophylaxis: Lovenox SQ Code Status: Full code  Family Communication: sister updated at bedside  today Level of care: Telemetry Medical Dispo:   The patient is from: home Anticipated d/c is to: SNF rehab Anticipated d/c date is: whenever bed available   Subjective and Interval History:  Sister reported pt eating ok now.  No pain complaint.   Objective: Vitals:   11/15/22 2115 11/16/22 0421 11/16/22 0810 11/16/22 1548  BP: 131/80 (!) 148/66 (!) 165/84 (!) 132/59  Pulse: 62 (!) 58 75 73  Resp: 19 19 16 18   Temp: 98.6 F (37 C) 98 F (36.7 C) 97.9 F (36.6 C) 97.8 F (36.6 C)  TempSrc: Oral Oral    SpO2: 98% 99% 98% 96%  Weight:      Height:        Intake/Output Summary (Last 24 hours) at 11/16/2022 1728 Last data filed at 11/16/2022 1518 Gross per 24 hour  Intake 80 ml  Output 400 ml  Net -320 ml   Filed Weights   11/09/22 1053  Weight: 37.2 kg    Examination:   Constitutional: NAD, alert, slow to respond HEENT: conjunctivae and lids normal, EOMI CV: No cyanosis.   RESP: normal respiratory effort, on RA Neuro: II - XII grossly intact.     Data Reviewed: I have personally reviewed labs and imaging studies  Time spent: 35 minutes  Darlin Priestly, MD [facility] If 7PM-7AM, please contact night-coverage 11/16/2022, 5:28 PM

## 2022-11-17 DIAGNOSIS — C50812 Malignant neoplasm of overlapping sites of left female breast: Secondary | ICD-10-CM | POA: Diagnosis not present

## 2022-11-17 DIAGNOSIS — D649 Anemia, unspecified: Secondary | ICD-10-CM | POA: Diagnosis not present

## 2022-11-17 DIAGNOSIS — C7951 Secondary malignant neoplasm of bone: Secondary | ICD-10-CM | POA: Diagnosis not present

## 2022-11-17 DIAGNOSIS — R531 Weakness: Secondary | ICD-10-CM | POA: Diagnosis not present

## 2022-11-17 LAB — VITAMIN B12: Vitamin B-12: 2065 pg/mL — ABNORMAL HIGH (ref 180–914)

## 2022-11-17 NOTE — Progress Notes (Signed)
PROGRESS NOTE    Cassandra Patterson  ZOX:[id number] DOB: Jun 20, 1928 DOA: 11/09/2022 PCP: Leanna Sato, MD  121A/121A-AA  LOS: 8 days   Brief hospital course:   Assessment & Plan: Cassandra Patterson is a 89 year old female with history of Mobitz type I second-degree heart block, history of TIA, history of hypertension, who presents to the emergency department for chief concerns of worsening generalized weakness.   Weakness:  likely secondary to metastatic cancer --SNF rehab   Breast cancer with extensive lytic bone lesions --w/ metastatic lesions of lumbosacral spine, left ischium, bilateral iliac bones, ribs, pathologic fractures at the inferior left ischial tuberosity, T12, L5 vertebral bodies.  Large palpable breast mass as per onco. CT abd/pelvis shows 3.1 cm soft tissue mass left lateral body wall, 3.7 cm cystic lesion in the left breast. CT chest shows multiple lytic bone metastases in T spine, ribs, 3.1 cm soft tissue lesion in T9 vertebral body destroys right pedicle  w/ tumor extension into the spinal cord with multiple other findings, see CT impression.   --Protein electrophoresis, light chain ratios pending as per onco.  --IR biopsy on 9/25.  Pathology showed metastatic moderately differentiated adenocarcinoma consistent with breast primary.  --palliative care consulted Plan: --f/u with onc Dr. Cathie Hoops for further management  AKI: resolved --Cr 1.27 on presentation.  Improved to 0.97 next day with IVF. --oral hydration now   Normocytic anemia of chronic disease --monitor and transfuse to keep Hgb >7 --anemia workup showed no def   Severe protein-calorie malnutrition:  nutrition consulted  --supplements per dietician   Hx of TIA:  --cont ASA   HTN:  --cont home felodipine  Pressure injury left buttock stage 3, present on admission Pressure injury sacrum stage 3, present on admission     DVT prophylaxis: Lovenox SQ Code Status: Full code  Family  Communication: sister updated at bedside today Level of care: Telemetry Medical Dispo:   The patient is from: home Anticipated d/c is to: SNF rehab Anticipated d/c date is: whenever bed available   Subjective and Interval History:  Pt reported pain with moving.     Objective: Vitals:   11/17/22 0520 11/17/22 0907 11/17/22 0926 11/17/22 1704  BP: (!) 162/70 (!) 147/66  (!) 148/73  Pulse: 60 (!) 57 62 85  Resp: 20 18  18   Temp: 98 F (36.7 C) 98.9 F (37.2 C)  98.1 F (36.7 C)  TempSrc: Oral Oral  Oral  SpO2: 100% 100%  100%  Weight:      Height:        Intake/Output Summary (Last 24 hours) at 11/17/2022 2004 Last data filed at 11/17/2022 1437 Gross per 24 hour  Intake 120 ml  Output 500 ml  Net -380 ml   Filed Weights   11/09/22 1053  Weight: 37.2 kg    Examination:   Constitutional: NAD, AAOx2, slow response time HEENT: conjunctivae and lids normal, EOMI CV: No cyanosis.   RESP: normal respiratory effort, on RA   Data Reviewed: I have personally reviewed labs and imaging studies  Time spent: 35 minutes  Darlin Priestly, MD [facility] If 7PM-7AM, please contact night-coverage 11/17/2022, 8:04 PM

## 2022-11-17 NOTE — Progress Notes (Signed)
Hematology/Oncology Progress note [phone] [phone]     Patient Care Team: Leanna Sato, MD as PCP - General (Family Medicine) End, Cristal Deer, MD as PCP - Cardiology (Cardiology)   Name of the patient: Cassandra Patterson  [id number]  1928/12/14  Date of visit: 11/17/22   INTERVAL HISTORY-    11/12/2022 status post left breast/chest wall mass biopsy by IR. Pathology showed metastatic moderately differentiated adenocarcinoma consistent with breast primary.  ER positive, GATA3 positive.  PR and HER2 immunohistochemistry staining is pending. ER 90% positive, ER 90% positive, HER2 negative.   Patient is sitting in the chair. No family members at bedside.    No Known Allergies  Patient Active Problem List   Diagnosis Date Noted   Palliative care encounter 11/14/2022   Malignant neoplasm of overlapping sites of left breast in female, estrogen receptor positive (HCC) 11/14/2022   Metastasis to bone (HCC) 11/10/2022   Normocytic anemia 11/10/2022   Pressure injury of skin 11/10/2022   Weakness 11/09/2022   Severe protein-calorie malnutrition (HCC) 11/09/2022   AKI (acute kidney injury) (HCC) 11/09/2022   Lytic bone lesion of hip 11/09/2022   Dry skin 11/09/2022   History of TIA (transient ischemic attack) 05/01/2021   TIA (transient ischemic attack) 01/06/2021   Mobitz type 1 second degree AV block 02/22/2020   Palpitations 01/26/2020   Heart murmur 01/26/2020   Essential hypertension 01/26/2020   Bradycardia 01/26/2020   First degree AV block 01/26/2020     Past Medical History:  Diagnosis Date   Cataracts, bilateral    Glaucoma    Hypertension    Mobitz type 1 second degree atrioventricular block    TIA (transient ischemic attack)      Past Surgical History:  Procedure Laterality Date   CATARACT EXTRACTION      Social History   Socioeconomic History   Marital status: Married    Spouse name: Not on file   Number of children:  Not on file   Years of education: Not on file   Highest education level: Not on file  Occupational History   Not on file  Tobacco Use   Smoking status: Never   Smokeless tobacco: Never  Vaping Use   Vaping status: Never Used  Substance and Sexual Activity   Alcohol use: No   Drug use: No   Sexual activity: Not Currently  Other Topics Concern   Not on file  Social History Narrative   Not on file   Social Determinants of Health   Financial Resource Strain: Not on file  Food Insecurity: Patient Unable To Answer (11/09/2022)   Hunger Vital Sign    Worried About Running Out of Food in the Last Year: Patient unable to answer    Ran Out of Food in the Last Year: Patient unable to answer  Transportation Needs: Patient Unable To Answer (11/09/2022)   PRAPARE - Transportation    Lack of Transportation (Medical): Patient unable to answer    Lack of Transportation (Non-Medical): Patient unable to answer  Physical Activity: Not on file  Stress: Not on file  Social Connections: Not on file  Intimate Partner Violence: Patient Unable To Answer (11/09/2022)   Humiliation, Afraid, Rape, and Kick questionnaire    Fear of Current or Ex-Partner: Patient unable to answer    Emotionally Abused: Patient unable to answer    Physically Abused: Patient unable to answer    Sexually Abused: Patient unable to answer     Family History  Problem  Relation Age of Onset   Hypertension Mother    Heart disease Neg Hx      Current Facility-Administered Medications:    0.9 %  sodium chloride infusion, , Intravenous, Continuous, Kennieth Francois, PA, Last Rate: 10 mL/hr at 11/12/22 1251, New Bag at 11/12/22 1251   acetaminophen (TYLENOL) tablet 500 mg, 500 mg, Oral, Q6H PRN, Darlin Priestly, MD   ascorbic acid (VITAMIN C) tablet 500 mg, 500 mg, Oral, BID, Fabienne Bruns M, MD, 500 mg at 11/17/22 0930   aspirin EC tablet 81 mg, 81 mg, Oral, Daily, Cox, Amy N, DO, 81 mg at 11/17/22 0930   brimonidine (ALPHAGAN)  0.15 % ophthalmic solution 1 drop, 1 drop, Both Eyes, BID, Cox, Amy N, DO, 1 drop at 11/17/22 6578   calcitonin (salmon) (MIACALCIN/FORTICAL) nasal spray 1 spray, 1 spray, Alternating Nares, Daily, Cox, Amy N, DO, 1 spray at 11/17/22 1022   cholecalciferol (VITAMIN D3) tablet 1,000 Units, 1,000 Units, Oral, Daily, Cox, Amy N, DO, 1,000 Units at 11/17/22 0930   enoxaparin (LOVENOX) injection 30 mg, 30 mg, Subcutaneous, QHS, Cox, Amy N, DO, 30 mg at 11/16/22 2233   feeding supplement (ENSURE ENLIVE / ENSURE PLUS) liquid 237 mL, 237 mL, Oral, BID BM, Charise Killian, MD, 237 mL at 11/17/22 0944   felodipine (PLENDIL) 24 hr tablet 5 mg, 5 mg, Oral, Daily, Cox, Amy N, DO, 5 mg at 11/17/22 0930   hydrocerin (EUCERIN) cream 1 Application, 1 Application, Topical, BID, Cox, Amy N, DO, 1 Application at 11/17/22 1011   latanoprost (XALATAN) 0.005 % ophthalmic solution 1 drop, 1 drop, Left Eye, QHS, Cox, Amy N, DO, 1 drop at 11/16/22 2234   Latanoprostene Bunod 0.024 % SOLN 1 drop, 1 drop, Ophthalmic, QHS, Cox, Amy N, DO, 1 drop at 11/16/22 2234   multivitamin with minerals tablet 1 tablet, 1 tablet, Oral, Daily, Cox, Amy N, DO, 1 tablet at 11/17/22 0931   ondansetron (ZOFRAN) tablet 4 mg, 4 mg, Oral, Q6H PRN **OR** ondansetron (ZOFRAN) injection 4 mg, 4 mg, Intravenous, Q6H PRN, Cox, Amy N, DO   senna-docusate (Senokot-S) tablet 1 tablet, 1 tablet, Oral, QHS PRN, Cox, Amy N, DO, 1 tablet at 11/17/22 0930   sodium bicarbonate tablet 650 mg, 650 mg, Oral, BID, Charise Killian, MD, 650 mg at 11/17/22 0931   zinc sulfate capsule 220 mg, 220 mg, Oral, Daily, Charise Killian, MD, 220 mg at 11/17/22 0930   Physical exam:  Vitals:   11/16/22 2049 11/17/22 0520 11/17/22 0907 11/17/22 0926  BP: (!) 140/67 (!) 162/70 (!) 147/66   Pulse: 71 60 (!) 57 62  Resp: 18 20 18    Temp: 97.8 F (36.6 C) 98 F (36.7 C) 98.9 F (37.2 C)   TempSrc: Oral Oral Oral   SpO2: 100% 100% 100%   Weight:      Height:        Physical Exam Constitutional:      Appearance: She is ill-appearing.  HENT:     Head: Normocephalic.  Eyes:     General: No scleral icterus. Cardiovascular:     Rate and Rhythm: Normal rate.  Pulmonary:     Effort: Pulmonary effort is normal. No respiratory distress.  Abdominal:     General: Abdomen is flat.  Musculoskeletal:     Cervical back: Normal range of motion.     Right lower leg: No edema.     Left lower leg: No edema.  Skin:    General:  Skin is warm and dry.  Neurological:     Mental Status: She is alert. Mental status is at baseline.  Psychiatric:        Mood and Affect: Mood normal.       Labs    Latest Ref Rng & Units 11/14/2022    7:22 AM 11/13/2022    5:45 AM 11/12/2022    7:04 AM  CBC  WBC 4.0 - 10.5 K/uL 6.1  6.8  5.8   Hemoglobin 12.0 - 15.0 g/dL 8.6  8.0  8.3   Hematocrit 36.0 - 46.0 % 29.2  26.9  28.5   Platelets 150 - 400 K/uL 235  232  229       Latest Ref Rng & Units 11/14/2022    7:22 AM 11/13/2022    5:45 AM 11/12/2022    7:04 AM  CMP  Glucose 70 - 99 mg/dL 84  75  82   BUN 8 - 23 mg/dL 24  24  27    Creatinine 0.44 - 1.00 mg/dL 1.61  0.96  0.45   Sodium 135 - 145 mmol/L 142  140  140   Potassium 3.5 - 5.1 mmol/L 4.2  4.2  4.2   Chloride 98 - 111 mmol/L 113  113  115   CO2 22 - 32 mmol/L 22  22  20    Calcium 8.9 - 10.3 mg/dL 8.4  8.3  8.3   Total Protein 6.5 - 8.1 g/dL   5.3   Total Bilirubin 0.3 - 1.2 mg/dL   0.6   Alkaline Phos 38 - 126 U/L   137   AST 15 - 41 U/L   35   ALT 0 - 44 U/L   19      RADIOGRAPHIC STUDIES: I have personally reviewed the radiological images as listed and agreed with the findings in the report. CT ASPIRATION N/S  Result Date: 11/12/2022 INDICATION: 89 year old female referred for biopsy L5 vertebral body, with inability to position on left decubitus or prone. We will proceed after speaking with oncology team in the patient's family with a left chest wall/breast mass biopsy EXAM: CT BIOPSY PUNCTURE  ASPIRATION SUPERFICIAL FLUID MEDICATIONS: None. ANESTHESIA/SEDATION: Moderate (conscious) sedation was not employed during this procedure. A total of Versed 0.5 mg and Fentanyl 0 mcg was administered intravenously. Moderate Sedation Time: 0 minutes. The patient's level of consciousness and vital signs were monitored continuously by radiology nursing throughout the procedure under my direct supervision. FLUOROSCOPY TIME:  CT COMPLICATIONS: None PROCEDURE: Informed written consent was obtained from the patient after a thorough discussion of the procedural risks, benefits and alternatives. All questions were addressed. Maximal Sterile Barrier Technique was utilized including caps, mask, sterile gowns, sterile gloves, sterile drape, hand hygiene and skin antiseptic. A timeout was performed prior to the initiation of the procedure. Patient was positioned supine on the CT gantry table. Scout CT of the chest performed for planning purposes. The patient is prepped and draped in the usual sterile fashion. 1% lidocaine was used for local anesthesia. Using CT guidance, guide needle was advanced into the solid mass of the left chest wall/breast. Once we confirmed needle tip position multiple 18 gauge core biopsy were acquired. Needle was removed. Final CT was acquired. Manual pressure was used for hemostasis. Sterile bandage was placed. Patient tolerated the procedure well and remained hemodynamically stable throughout. No complications were encountered and no significant blood loss. IMPRESSION: Status post CT-guided biopsy of left chest wall mass/breast mass. Signed, Yvone Neu. Loreta Ave,  DO, ABVM, RPVI [facility] Radiology Electronically Signed   By: Gilmer Mor D.O.   On: 11/12/2022 16:07   MR THORACIC SPINE W WO CONTRAST  Result Date: 11/11/2022 CLINICAL DATA:  Metastatic disease evaluation.  Weakness. EXAM: MRI THORACIC WITHOUT AND WITH CONTRAST TECHNIQUE: Multiplanar and  multiecho pulse sequences of the thoracic spine were obtained without and with intravenous contrast. CONTRAST:  4mL GADAVIST GADOBUTROL 1 MMOL/ML IV SOLN COMPARISON:  CT chest 11/10/2022 FINDINGS: Multiple sequences are moderately motion degraded. Alignment: Increased thoracic kyphosis. Thoracic dextroscoliosis. No significant listhesis. Vertebrae: Widespread enhancing lesions throughout the thoracic spine as well as the cervical and included upper lumbar spine. Large destructive lesion involving the right aspect of the T9 vertebral body and pedicle as seen on CT with right-sided ventral and lateral epidural tumor not resulting in significant spinal stenosis or spinal cord mass effect. Small volume left-sided ventral epidural tumor at T2 without stenosis. T6 and T8 compression fractures with 50% anterior vertebral body height loss. Multiple rib lesions and rib fractures, better demonstrated on the prior CT. Cord:  Normal signal and morphology. Paraspinal and other soft tissues: Small bilateral pleural effusions. Disc levels: Mild thoracic spondylosis. No spinal stenosis. Moderate right neural foraminal encroachment at T9-10 due to tumor. IMPRESSION: 1. Widespread osseous metastases. 2. Epidural tumor at T2 and T9 without significant spinal stenosis. 3. T6 and T8 compression fractures. Electronically Signed   By: Sebastian Ache M.D.   On: 11/11/2022 18:34   CT CHEST WO CONTRAST  Result Date: 11/11/2022 CLINICAL DATA:  Breast cancer staging.  * Tracking Code: BO * EXAM: CT CHEST WITHOUT CONTRAST TECHNIQUE: Multidetector CT imaging of the chest was performed following the standard protocol without IV contrast. RADIATION DOSE REDUCTION: This exam was performed according to the departmental dose-optimization program which includes automated exposure control, adjustment of the mA and/or kV according to patient size and/or use of iterative reconstruction technique. COMPARISON:  None Available. FINDINGS: Cardiovascular:  The heart is markedly enlarged. No substantial pericardial effusion. Ascending thoracic aorta measures on the order of 4.2 cm diameter. Descending thoracic aorta measures 3.8 cm diameter. Enlargement of the pulmonary outflow tract/main pulmonary arteries suggests pulmonary arterial hypertension. There is moderate atherosclerotic calcification of the abdominal aorta without aneurysm. Mediastinum/Nodes: 1.6 cm nodule identified right thyroid lobe. Upper normal mediastinal lymph nodes evident although assessment limited by lack of mediastinal fat and lack of intravenous contrast material. No evidence for gross hilar lymphadenopathy although assessment is limited by the lack of intravenous contrast on the current study. The esophagus has normal imaging features. No right axillary lymphadenopathy. Left axilla incompletely visualized. 10 mm short axis left subpectoral lymph node seen on image 49/2 with probable greater than 18 mm short axis left axillary node on 76/2, incompletely visualized. Left breast is been incompletely visualized with greater than 3 cm homogeneous well-defined incompletely visualized structure noted on image 110/2. Lungs/Pleura: Centrilobular emphsyema noted. Perifissural nodule measuring 4 mm noted right lung on 60/4 with additional 4-5 mm nodules in the right lung on images 68 and 89 of series 4. There is dependent collapse/consolidation in both lower lungs with small bilateral pleural effusions. Upper Abdomen: Unremarkable on noncontrast imaging. Musculoskeletal: Multiple lytic bone metastases evident in the thoracic spine and ribs including posterior right second rib on image 3/series 2. Metastatic lesion in the posterior T2 vertebral body erodes the posterior cortex. 3.1 cm soft tissue lesion in the T9 vertebral body destroys the right pedicle and posterior cortex of  the vertebral body with tumor extension into the spinal canal (see image 68/2). Chronic fracture nonunion noted in the right  clavicle pathologic fracture noted posterior right ninth and tenth ribs as well as the posterior left twelfth in tenth ribs. Multiple nonacute left-sided rib fractures evident. IMPRESSION: 1. Multiple lytic bone metastases in the thoracic spine and ribs. 3.1 cm soft tissue lesion in the T9 vertebral body destroys the right pedicle and posterior cortex of the vertebral body with tumor extension into the spinal canal. MRI of the thoracic spine with and without contrast recommended to further evaluate. 2. Metastatic lesion in the T2 vertebral body destroys the posterior cortex on sagittal imaging suggests extension in the anterior spinal canal. 3. Pathologic fractures of the posterior right ninth and tenth ribs as well as the posterior left twelfth and tenth ribs. Innumerable thoracic spine and rib metastases evident. 4. Left axillary and subpectoral lymphadenopathy, incompletely visualized. 5. 1.6 cm right thyroid lobe nodule. In the setting of significant comorbidities or limited life expectancy, no follow-up recommended (ref: J Am Coll Radiol. 2015 Feb;12(2): 143-50). 6. Small bilateral pleural effusions with dependent collapse/consolidation in both lower lungs. 7. Tiny right lung nodules.  Metastatic disease not excluded. 8. Aortic Atherosclerosis (ICD10-I70.0) and Emphysema (ICD10-J43.9). Electronically Signed   By: Kennith Center M.D.   On: 11/11/2022 10:29   CT ABDOMEN PELVIS W CONTRAST  Result Date: 11/09/2022 CLINICAL DATA:  Acute abdominal pain EXAM: CT ABDOMEN AND PELVIS WITH CONTRAST TECHNIQUE: Multidetector CT imaging of the abdomen and pelvis was performed using the standard protocol following bolus administration of intravenous contrast. RADIATION DOSE REDUCTION: This exam was performed according to the departmental dose-optimization program which includes automated exposure control, adjustment of the mA and/or kV according to patient size and/or use of iterative reconstruction technique. CONTRAST:   75mL OMNIPAQUE IOHEXOL 300 MG/ML  SOLN COMPARISON:  None Available. FINDINGS: Lower chest: T9 vertebral body lytic metastasis. Multiple lytic rib metastases. 3.1 cm soft tissue mass left lateral body wall. No pleural or pericardial effusion. Dependent atelectasis in the lung bases. 3.7 cm cystic lesion in the left breast. Hepatobiliary: Subcentimeter calcified stone in the dependent aspect of the nondilated gallbladder. No focal liver lesion or biliary ductal dilatation. Pancreas: Unremarkable. No pancreatic ductal dilatation or surrounding inflammatory changes. Spleen: Normal in size without focal abnormality. Adrenals/Urinary Tract: No adrenal mass. Symmetric renal parenchymal enhancement without focal lesion or evident urolithiasis. Urinary bladder nondistended. Stomach/Bowel: Stomach decompressed. Small bowel nondilated. Normal appendix. The colon is partially distended, without acute finding. Vascular/Lymphatic: Moderate scattered calcified aortoiliac atheromatous plaque without aneurysm or stenosis. Portal vein patent. No abdominal or pelvic adenopathy. Reproductive: Status post hysterectomy. No adnexal masses. Other: No ascites.  No free air. Musculoskeletal: Multiple lytic bone lesions throughout the lumbosacral spine, left ischium, bilateral iliac bones. Lytic lesion in the inferior left ischial tuberosity with pathologic fracture, minimally displaced. Pathologic compression deformity of L5 with a greater than 50% loss of height. Mild compression deformity of T12. fixation hardware in the posterior left acetabulum. IMPRESSION: 1. Multiple lytic bone lesions throughout the lumbosacral spine, left ischium, bilateral iliac bones, and ribs. Pathologic fractures of the inferior left ischial tuberosity, T12 and L5 vertebral bodies. 2. 3.1 cm soft tissue mass left lateral body wall. 3. 3.7 cm cystic lesion in the left breast. 4. Cholelithiasis. 5.  Aortic Atherosclerosis (ICD10-I70.0). Electronically Signed   By:  Corlis Leak M.D.   On: 11/09/2022 13:53   CT Head Wo Contrast  Result Date: 11/09/2022 CLINICAL  DATA:  Mental status change with unknown cause. Neck trauma EXAM: CT HEAD WITHOUT CONTRAST CT CERVICAL SPINE WITHOUT CONTRAST TECHNIQUE: Multidetector CT imaging of the head and cervical spine was performed following the standard protocol without intravenous contrast. Multiplanar CT image reconstructions of the cervical spine were also generated. RADIATION DOSE REDUCTION: This exam was performed according to the departmental dose-optimization program which includes automated exposure control, adjustment of the mA and/or kV according to patient size and/or use of iterative reconstruction technique. COMPARISON:  Brain MRI 01/07/2021 FINDINGS: CT HEAD FINDINGS Brain: No evidence of acute infarction, hemorrhage, hydrocephalus, extra-axial collection or mass lesion/mass effect. Small right cerebellar infarct, chronic by prior MRI. Generalized brain atrophy. Vascular: No hyperdense vessel or unexpected calcification. Skull: Multiple lytic lesions scattered throughout the calvarium. Parasagittal right frontal and parasagittal left parietal deposits erode the inner table. Additional notable lesion in the left sphenoid wing eroding the inner table. Sinuses/Orbits: No acute finding CT CERVICAL SPINE FINDINGS Alignment: Normal Skull base and vertebrae: No acute fracture. Multiple lytic bone lesions seen in the C3, C6, C7, T1, T2, and T3 bodies. Notable posterior element deposits at T1-T4. Partially covered nonacute right clavicle fracture with bulky callus that could obscure underlying lesion. Soft tissues and spinal canal: No prevertebral fluid or swelling. No visible canal hematoma. Disc levels:  Ordinary degenerative spurring. Upper chest: Sizable metastatic deposit at the right second rib posteriorly. IMPRESSION: Widespread osseous metastatic disease including deposits that erodes through the inner table. No acute  intracranial finding.  No visible brain metastasis. Electronically Signed   By: Tiburcio Pea M.D.   On: 11/09/2022 11:45   CT Cervical Spine Wo Contrast  Result Date: 11/09/2022 CLINICAL DATA:  Mental status change with unknown cause. Neck trauma EXAM: CT HEAD WITHOUT CONTRAST CT CERVICAL SPINE WITHOUT CONTRAST TECHNIQUE: Multidetector CT imaging of the head and cervical spine was performed following the standard protocol without intravenous contrast. Multiplanar CT image reconstructions of the cervical spine were also generated. RADIATION DOSE REDUCTION: This exam was performed according to the departmental dose-optimization program which includes automated exposure control, adjustment of the mA and/or kV according to patient size and/or use of iterative reconstruction technique. COMPARISON:  Brain MRI 01/07/2021 FINDINGS: CT HEAD FINDINGS Brain: No evidence of acute infarction, hemorrhage, hydrocephalus, extra-axial collection or mass lesion/mass effect. Small right cerebellar infarct, chronic by prior MRI. Generalized brain atrophy. Vascular: No hyperdense vessel or unexpected calcification. Skull: Multiple lytic lesions scattered throughout the calvarium. Parasagittal right frontal and parasagittal left parietal deposits erode the inner table. Additional notable lesion in the left sphenoid wing eroding the inner table. Sinuses/Orbits: No acute finding CT CERVICAL SPINE FINDINGS Alignment: Normal Skull base and vertebrae: No acute fracture. Multiple lytic bone lesions seen in the C3, C6, C7, T1, T2, and T3 bodies. Notable posterior element deposits at T1-T4. Partially covered nonacute right clavicle fracture with bulky callus that could obscure underlying lesion. Soft tissues and spinal canal: No prevertebral fluid or swelling. No visible canal hematoma. Disc levels:  Ordinary degenerative spurring. Upper chest: Sizable metastatic deposit at the right second rib posteriorly. IMPRESSION: Widespread osseous  metastatic disease including deposits that erodes through the inner table. No acute intracranial finding.  No visible brain metastasis. Electronically Signed   By: Tiburcio Pea M.D.   On: 11/09/2022 11:45   DG Chest 2 View  Result Date: 11/09/2022 CLINICAL DATA:  Chest pain.  Back and foot wounds. EXAM: CHEST - 2 VIEW COMPARISON:  01/06/2021 FINDINGS: Moderate cardiomegaly again noted. Ectasia  and atherosclerotic calcification of the thoracic aorta again seen. Both lungs remain clear. No evidence of pneumothorax or pleural effusion. Several displaced left lateral rib fracture deformities are seen several displaced rib fractures are seen involving the left lateral 4th through 9th ribs, at least 1 of which shows periosteal new bone formation. IMPRESSION: Moderate cardiomegaly. No active lung disease. Multiple left lateral rib fractures, at least 1 of which shows periosteal new bone formation. Electronically Signed   By: Danae Orleans M.D.   On: 11/09/2022 11:16    Assessment and plan-   # Metastatic breast cancer with extensive bone metastasis, including epidural tumor at T2 and T9, compression fracture at T6 and T8. Pathology results were reviewed and discussed with patient's sisters. I recommend palliative radiation to her thoracic spine epidural tumor. Breast cancer is ER/PR strongly positive and HER2 negative.  Recommend endocrine therapy with AI . I called patient's POA [name], and discussed about recommendation of AI, rationale and side effects. [name] is undecided and would like to further discuss her sisters. [name] will update RN if they would like to proceed.   She will also need to establish care with radiation oncology for evaluation of outpatient radiation. Palliative care service on board  # Normocytic anemia, She has microcytic orders iron panel is not consistent with iron deficiency anemia. normal B12 and folate retic panel.. She has increased nRBC Could be due to marrow involvement   Please transfuse PRBC to keep hemoglobin above 7.  Thank you for allowing me to participate in the care of this patient  Rickard Patience, MD, PhD Hematology Oncology 11/17/2022

## 2022-11-17 NOTE — TOC Progression Note (Signed)
Transition of Care [facility]) - Progression Note    Patient Details  Name: Cassandra Patterson MRN: [id number] Date of Birth: 26-Dec-1928  Transition of Care [facility]) CM/SW Contact  Garret Reddish, RN Phone Number: 11/17/2022, 11:53 AM  Clinical Narrative:    Chart reviewed.  I have spoken to patient's sister [name].  I have given her additional bed offers. She would like to check into additional bed offer and will follow back up with me.  I have informed [name] that patient is approaching readiness for discharge.    TOC will continue to follow for discharge planning.     Expected Discharge Plan: Skilled Nursing Facility Barriers to Discharge: Continued Medical Work up, SNF Pending bed offer, Insurance Authorization  Expected Discharge Plan and Services                                               Social Determinants of Health (SDOH) Interventions SDOH Screenings   Food Insecurity: Patient Unable To Answer (11/09/2022)  Housing: Patient Unable To Answer (11/09/2022)  Transportation Needs: Patient Unable To Answer (11/09/2022)  Utilities: Patient Unable To Answer (11/09/2022)  Tobacco Use: Low Risk  (11/09/2022)    Readmission Risk Interventions     No data to display

## 2022-11-17 NOTE — Progress Notes (Addendum)
Physical Therapy Treatment Patient Details Name: Cassandra Patterson MRN: [id number] DOB: 1928-05-12 Today's Date: 11/17/2022   History of Present Illness Pt is a 89 yo female that presented to ED for weakness. PMH of TIA, HTN, mobitz. Imaging showed multiple L rib fractures and further workup showed osseous metastatic disease.    PT Comments  Patient alert, oriented to self and family in room, seen with OT to maximize pt/therapist safey and function. Improved alertness and participation compared to evaluation. 2+ for safety provided for most mobility. maxA to sit EOB, sit <> stand, stand pivot, and ~58ft of ambulation with RW CGA-minA. Noted for intermittent unsteadiness, RW management/stabilization and posterior lean. Pt up in recliner with needs in reach at end of session.     If plan is discharge home, recommend the following: Two people to help with walking and/or transfers;Two people to help with bathing/dressing/bathroom;Direct supervision/assist for medications management;Help with stairs or ramp for entrance;Assist for transportation;Assistance with feeding;Assistance with cooking/housework;Supervision due to cognitive status   Can travel by private vehicle     No  Equipment Recommendations  Other (comment) (TBD)    Recommendations for Other Services       Precautions / Restrictions Precautions Precaution Comments: multiple sites of metatasic lesions Restrictions Weight Bearing Restrictions: No     Mobility  Bed Mobility Overal bed mobility: Needs Assistance Bed Mobility: Supine to Sit     Supine to sit: Max assist, +2 for safety/equipment, HOB elevated          Transfers Overall transfer level: Needs assistance Equipment used: Rolling walker (2 wheels) Transfers: Sit to/from Stand, Bed to chair/wheelchair/BSC Sit to Stand: Min assist, +2 safety/equipment   Step pivot transfers: Min assist, +2 safety/equipment            Ambulation/Gait Ambulation/Gait  assistance: Min assist, Contact guard assist Gait Distance (Feet): 2 Feet Assistive device: Rolling walker (2 wheels)         General Gait Details: able to ambulate a few steps, intermittently minA due to unsteadiness/posterior lean/RW management   Stairs             Wheelchair Mobility     Tilt Bed    Modified Rankin (Stroke Patients Only)       Balance Overall balance assessment: Needs assistance Sitting-balance support: Feet supported, Bilateral upper extremity supported Sitting balance-Leahy Scale: Fair     Standing balance support: Reliant on assistive device for balance, Bilateral upper extremity supported Standing balance-Leahy Scale: Poor                              Cognition Arousal: Alert Behavior During Therapy: WFL for tasks assessed/performed                                   General Comments: pt oriented to name and family in room, extended time and intermittent repetition to follow one step commands        Exercises Other Exercises Other Exercises: stand pivot to commode CGA-minAx2 with RW. seated pericare but required maxA in standing to ensure clealiness as well as to doff/don underwear    General Comments        Pertinent Vitals/Pain Pain Assessment Pain Assessment: Faces Faces Pain Scale: Hurts a little bit Pain Location: grimaces with bed mobility Pain Descriptors / Indicators: Grimacing, Guarding Pain Intervention(s): Limited activity within patient's tolerance,  Monitored during session, Repositioned    Home Living                          Prior Function            PT Goals (current goals can now be found in the care plan section) Progress towards PT goals: Progressing toward goals    Frequency    Min 1X/week      PT Plan      Co-evaluation PT/OT/SLP Co-Evaluation/Treatment: Yes Reason for Co-Treatment: To address functional/ADL transfers;For patient/therapist safety PT  goals addressed during session: Mobility/safety with mobility;Proper use of DME OT goals addressed during session: ADL's and self-care;Proper use of Adaptive equipment and DME      AM-PAC PT "6 Clicks" Mobility   Outcome Measure  Help needed turning from your back to your side while in a flat bed without using bedrails?: A Lot Help needed moving from lying on your back to sitting on the side of a flat bed without using bedrails?: A Lot Help needed moving to and from a bed to a chair (including a wheelchair)?: A Lot Help needed standing up from a chair using your arms (e.g., wheelchair or bedside chair)?: A Lot Help needed to walk in hospital room?: A Lot Help needed climbing 3-5 steps with a railing? : Total 6 Click Score: 11    End of Session   Activity Tolerance: Patient tolerated treatment well Patient left: in chair;with call bell/phone within reach;with chair alarm set Nurse Communication: Mobility status PT Visit Diagnosis: Other abnormalities of gait and mobility (R26.89);Muscle weakness (generalized) (M62.81);Difficulty in walking, not elsewhere classified (R26.2)     Time: 0102-7253 PT Time Calculation (min) (ACUTE ONLY): 23 min  Charges:    $Therapeutic Activity: 8-22 mins PT General Charges $$ ACUTE PT VISIT: 1 Visit                     Olga Coaster PT, DPT 11:22 AM,11/17/22

## 2022-11-17 NOTE — Evaluation (Signed)
Occupational Therapy Evaluation Patient Details Name: Cassandra Patterson MRN: [id number] DOB: [birth year] Today's Date: 11/17/2022   History of Present Illness Pt is a 89 yo female that presented to ED for weakness. PMH of TIA, HTN, mobitz. Imaging showed multiple L rib fractures and further workup showed osseous metastatic disease.   Clinical Impression   Ms. Camps was seen for OT evaluation this date. Prior to hospital admission, pt was living at home, with family providing 24/7 assistance with ADL/IADL management. Pt lives in a 1 level home with a level entrance. Pt presents to acute OT demonstrating impaired ADL performance and functional mobility 2/2 generalized weakness, decreased activity tolerance, and decreased balance (See OT problem list for additional functional deficits). Pt currently requires +2 MAX A for bed mobility, +2 MIN A for STS t/fs and toilet transfers, MOD A for LB ADL management from STS with +2 assist for standing balance.  Pt would benefit from skilled OT services to address noted impairments and functional limitations (see below for any additional details) in order to maximize safety and independence while minimizing falls risk and caregiver burden. Anticipate the need for follow up OT services upon acute hospital DC.        If plan is discharge home, recommend the following: Two people to help with walking and/or transfers;Two people to help with bathing/dressing/bathroom;Assistance with cooking/housework;Assist for transportation;Help with stairs or ramp for entrance;Supervision due to cognitive status    Functional Status Assessment  Patient has had a recent decline in their functional status and demonstrates the ability to make significant improvements in function in a reasonable and predictable amount of time.  Equipment Recommendations  BSC/3in1    Recommendations for Other Services       Precautions / Restrictions Precautions Precautions:  Fall Precaution Comments: multiple sites of metatasic lesions Restrictions Weight Bearing Restrictions: No      Mobility Bed Mobility Overal bed mobility: Needs Assistance Bed Mobility: Supine to Sit     Supine to sit: Max assist, +2 for safety/equipment, HOB elevated Sit to supine: Max assist        Transfers Overall transfer level: Needs assistance Equipment used: Rolling walker (2 wheels) Transfers: Sit to/from Stand, Bed to chair/wheelchair/BSC Sit to Stand: Min assist, +2 safety/equipment     Step pivot transfers: Min assist, +2 safety/equipment            Balance Overall balance assessment: Needs assistance Sitting-balance support: Feet supported, Bilateral upper extremity supported Sitting balance-Leahy Scale: Fair Sitting balance - Comments: initially requires support in sitting but is able to progress to supervision with BUE support.   Standing balance support: Reliant on assistive device for balance, Bilateral upper extremity supported Standing balance-Leahy Scale: Poor                             ADL either performed or assessed with clinical judgement   ADL Overall ADL's : Needs assistance/impaired                                       General ADL Comments: Pt is functionally limited by generalized weakness, decreased activity tolerance, and decreased balance. She requires MAX A +2 for bed mobility, MIN A +2 STS, and functional transfers. MOD A for LB dressing and peri hygiene after toileting. +2 MIN A for step pivot to/from [facility].  Vision Patient Visual Report: No change from baseline       Perception         Praxis         Pertinent Vitals/Pain Pain Assessment Pain Assessment: Faces Faces Pain Scale: Hurts a little bit Pain Descriptors / Indicators: Grimacing, Guarding Pain Intervention(s): Limited activity within patient's tolerance, Monitored during session, Repositioned     Extremity/Trunk Assessment  Upper Extremity Assessment Upper Extremity Assessment: Generalized weakness   Lower Extremity Assessment Lower Extremity Assessment: Generalized weakness   Cervical / Trunk Assessment Cervical / Trunk Assessment: Kyphotic   Communication Communication Communication: Hearing impairment Cueing Techniques: Verbal cues;Gestural cues;Tactile cues   Cognition Arousal: Alert Behavior During Therapy: WFL for tasks assessed/performed Overall Cognitive Status: Difficult to assess                                 General Comments: pt oriented to name and family in room, extended time and intermittent repetition to follow one step commands     General Comments       Exercises Other Exercises Other Exercises: Pt/caregiver educated on role of OT in acute setting, safety, falls prevention and assist provided during ADL management as described above.   Shoulder Instructions      Home Living Family/patient expects to be discharged to:: Private residence Living Arrangements: Alone Available Help at Discharge: Family;Available 24 hours/day Type of Home: House Home Access: Stairs to enter [employer] of Steps: 1   Home Layout: One level     Bathroom Shower/Tub: Tub/shower unit (Pt spongebathing at baseline.)         Home Equipment: Agricultural consultant (2 wheels)          Prior Functioning/Environment               Mobility Comments: family is assisting with mobility but unclear how much assist ADLs Comments: family performing IADLs, and providing at least some physical assistance with bathing, dressing, etc. Pt sponge bathing at baseline as she reports fear of falling in bathroom.        OT Problem List: Decreased strength;Decreased range of motion;Decreased safety awareness;Decreased activity tolerance;Impaired balance (sitting and/or standing);Decreased coordination      OT Treatment/Interventions: Self-care/ADL training;Therapeutic exercise;DME  and/or AE instruction;Balance training;Energy conservation;Cognitive remediation/compensation;Patient/family education    OT Goals(Current goals can be found in the care plan section) Acute Rehab OT Goals Patient Stated Goal: To feel better OT Goal Formulation: With patient Time For Goal Achievement: 12/01/22 Potential to Achieve Goals: Good ADL Goals Pt Will Perform Grooming: sitting;with set-up;with supervision Pt Will Perform Lower Body Dressing: with min assist;sit to/from stand;with adaptive equipment Pt Will Transfer to Toilet: bedside commode;with contact guard assist;ambulating Pt Will Perform Toileting - Clothing Manipulation and hygiene: sit to/from stand;with min assist;with adaptive equipment;with caregiver independent in assisting  OT Frequency: Min 1X/week    Co-evaluation   Reason for Co-Treatment: To address functional/ADL transfers;For patient/therapist safety PT goals addressed during session: Mobility/safety with mobility;Proper use of DME OT goals addressed during session: ADL's and self-care;Proper use of Adaptive equipment and DME      AM-PAC OT "6 Clicks" Daily Activity     Outcome Measure Help from another person eating meals?: A Little Help from another person taking care of personal grooming?: A Little Help from another person toileting, which includes using toliet, bedpan, or urinal?: A Lot Help from another person bathing (including washing, rinsing, drying)?: A Lot  Help from another person to put on and taking off regular upper body clothing?: A Little Help from another person to put on and taking off regular lower body clothing?: A Lot 6 Click Score: 15   End of Session Equipment Utilized During Treatment: Gait belt;Rolling walker (2 wheels) Nurse Communication: Mobility status  Activity Tolerance: Patient tolerated treatment well Patient left: in chair;with chair alarm set;with family/visitor present  OT Visit Diagnosis: Other abnormalities of gait  and mobility (R26.89);Muscle weakness (generalized) (M62.81)                Time: 7846-9629 OT Time Calculation (min): 29 min Charges:  OT General Charges $OT Visit: 1 Visit OT Evaluation $OT Eval Moderate Complexity: 1 Mod  Rockney Ghee, M.S., OTR/L 11/17/22, 12:37 PM

## 2022-11-17 NOTE — Plan of Care (Signed)
  Problem: Clinical Measurements: Goal: Diagnostic test results will improve Outcome: Progressing   Problem: Coping: Goal: Level of anxiety will decrease Outcome: Progressing   Problem: Elimination: Goal: Will not experience complications related to bowel motility Outcome: Progressing   Problem: Safety: Goal: Ability to remain free from injury will improve Outcome: Progressing

## 2022-11-18 DIAGNOSIS — R531 Weakness: Secondary | ICD-10-CM | POA: Diagnosis not present

## 2022-11-18 MED ORDER — ENSURE ENLIVE PO LIQD
237.0000 mL | Freq: Three times a day (TID) | ORAL | Status: DC
  Administered 2022-11-18 – 2022-12-02 (×36): 237 mL via ORAL

## 2022-11-18 MED ORDER — LETROZOLE 2.5 MG PO TABS
2.5000 mg | ORAL_TABLET | Freq: Every day | ORAL | Status: DC
  Administered 2022-11-18 – 2022-12-02 (×15): 2.5 mg via ORAL
  Filled 2022-11-18 (×15): qty 1

## 2022-11-18 NOTE — TOC Progression Note (Signed)
Transition of Care [facility]) - Progression Note    Patient Details  Name: SHAVAWN STOBAUGH MRN: [id number] Date of Birth: 08/14/1928  Transition of Care [facility]) CM/SW Contact  Garret Reddish, RN Phone Number: 11/18/2022, 1:49 PM  Clinical Narrative:     Patient's sister has accepted bed offer at [facility].    I have asked [employer] to start authorization for Mrs. Hogue.  I have spoken with Tammy from [facility].  I have also requested Ambulance transport authorization.    TOC will continue to follow for discharge planning.    Expected Discharge Plan: Skilled Nursing Facility Barriers to Discharge: Continued Medical Work up, SNF Pending bed offer, Insurance Authorization  Expected Discharge Plan and Services                                               Social Determinants of Health (SDOH) Interventions SDOH Screenings   Food Insecurity: Patient Unable To Answer (11/09/2022)  Housing: Patient Unable To Answer (11/09/2022)  Transportation Needs: Patient Unable To Answer (11/09/2022)  Utilities: Patient Unable To Answer (11/09/2022)  Tobacco Use: Low Risk  (11/09/2022)    Readmission Risk Interventions     No data to display

## 2022-11-18 NOTE — Care Management Important Message (Signed)
Important Message  Patient Details  Name: Cassandra Patterson MRN: [id number] Date of Birth: 02-20-1928   Important Message Given:  Other (see comment)  I reviewed the Important Message from Medicare with the patient's sister, [name] [phone]) and she stated she understood these rights. I wished her sister a speedy recovery and thanked her for her time.[name] A Equan Cogbill 11/18/2022, 1:17 PM

## 2022-11-18 NOTE — Plan of Care (Signed)

## 2022-11-18 NOTE — Progress Notes (Signed)
PROGRESS NOTE    Cassandra Patterson  WGN:[id number] DOB: 1928-03-19 DOA: 11/09/2022 PCP: Leanna Sato, MD  [id number]  LOS: 9 days   Brief hospital course:   Assessment & Plan: Cassandra Patterson is a 89 year old female with history of Mobitz type I second-degree heart block, history of TIA, history of hypertension, who presented to the emergency department for chief concerns of worsening generalized weakness.   Weakness:  likely secondary to metastatic cancer --SNF rehab   Breast cancer with extensive lytic bone lesions --w/ metastatic lesions of lumbosacral spine, left ischium, bilateral iliac bones, ribs, pathologic fractures at the inferior left ischial tuberosity, T12, L5 vertebral bodies.  Large palpable breast mass as per onco. CT abd/pelvis shows 3.1 cm soft tissue mass left lateral body wall, 3.7 cm cystic lesion in the left breast. CT chest shows multiple lytic bone metastases in T spine, ribs, 3.1 cm soft tissue lesion in T9 vertebral body destroys right pedicle w/ tumor extension into the spinal cord with multiple other findings, see CT impression.   --Protein electrophoresis, light chain ratios pending as per onco.  --IR biopsy on 9/25.  Pathology showed metastatic moderately differentiated adenocarcinoma consistent with breast primary.  --palliative care consulted Plan: --further management per Dr. Cathie Hoops  AKI: resolved --Cr 1.27 on presentation.  Improved to 0.97 next day with IVF. --oral hydration now   Normocytic anemia of chronic disease --anemia workup showed no def --monitor Hgb and transfuse to keep Hgb >7   Severe protein-calorie malnutrition:  nutrition consulted  --supplements per dietician   Hx of TIA:  --cont ASA   HTN:  --cont home felodipine  Pressure injury left buttock stage 3, present on admission Pressure injury sacrum stage 3, present on admission   Partial thickness skin tear to left heel --wound RN consulted --dressing change per  order, and float heel    DVT prophylaxis: Lovenox SQ Code Status: Full code  Family Communication: sisters updated at bedside today Level of care: Telemetry Medical Dispo:   The patient is from: home Anticipated d/c is to: SNF rehab Anticipated d/c date is: whenever bed available   Subjective and Interval History:  Pt reported doing ok.     Objective: Vitals:   11/17/22 2034 11/18/22 0438 11/18/22 0729 11/18/22 1609  BP: (!) 158/74 (!) 160/79 (!) 169/68 138/67  Pulse: 96 79 81 69  Resp: 18 20 18 16   Temp: 98.1 F (36.7 C) 98.6 F (37 C) (!) 97.4 F (36.3 C) (!) 97.5 F (36.4 C)  TempSrc: Oral Oral    SpO2: 96% 99% 93% 100%  Weight:      Height:       No intake or output data in the 24 hours ending 11/18/22 1905  Filed Weights   11/09/22 1053  Weight: 37.2 kg    Examination:   Constitutional: NAD, alert, oriented to person and place, slow response time HEENT: conjunctivae and lids normal, EOMI CV: No cyanosis.   RESP: normal respiratory effort, on RA Extremities: left foot wrapped SKIN: warm, dry   Data Reviewed: I have personally reviewed labs and imaging studies  Time spent: 35 minutes  Darlin Priestly, MD [facility] If 7PM-7AM, please contact night-coverage 11/18/2022, 7:05 PM

## 2022-11-18 NOTE — Progress Notes (Signed)
Nutrition Follow-up  DOCUMENTATION CODES:   Severe malnutrition in context of chronic illness, Underweight  INTERVENTION:   -Increase Ensure Enlive po to TID, each supplement provides 350 kcal and 20 grams of protein.  -Continue Magic cup BID with meals, each supplement provides 290 kcal and 9 grams of protein  -Continue MVI with minerals daily -Continue regular diet -Continue 500 mg vitamin C BID -Continue 220 mg zinc sulfate daily x 14 days   NUTRITION DIAGNOSIS:   Severe Malnutrition related to chronic illness (concern for widespread metastatic disease) as evidenced by moderate muscle depletion, severe muscle depletion, moderate fat depletion, severe fat depletion, percent weight loss.  Ongoing  GOAL:   Patient will meet greater than or equal to 90% of their needs  Progressing   MONITOR:   PO intake, Supplement acceptance  REASON FOR ASSESSMENT:   Consult Assessment of nutrition requirement/status  ASSESSMENT:   Pt with history of Mobitz type I second-degree heart block, history of TIA, history of hypertension, who presents for chief concerns of worsening generalized weakness.  9/25- s/p CT guided biopsy of left chest wall/breast mass   Reviewed I/O's: -180 ml x 24 hours and +267 ml since admission  UOP: 300 ml x 24 hours  Pt remains with fair appetite. Noted meal completions 25-50%. Pt is drinking Ensure supplements.   Palliative care following; pt with metastatic breast cancer with extensive bone metastasis, including epidural tumor at T2 and T9, compression fracture at T6 and T8. Oncology recommending palliative radiation therapy. Pt sister to discuss options with rest of family. If wishes to proceed, radiation oncology will be consulted as an outpatient.   Per TOC notes, plan for SNF placement at discharge.   Medications reviewed and include vitamin C, vitamin D3, and zinc sulfate.   Labs reviewed: CBGS: 215 (inpatient orders for glycemic control are  none).    Diet Order:   Diet Order             Diet regular Room service appropriate? Yes; Fluid consistency: Thin  Diet effective now                   EDUCATION NEEDS:   No education needs have been identified at this time  Skin:  Skin Assessment: Skin Integrity Issues: Skin Integrity Issues:: Stage III, Other (Comment) Stage III: sacrum, buttocks Other: lt heel laceration  Last BM:  11/17/22 (type 4)  Height:   Ht Readings from Last 1 Encounters:  11/09/22 5\' 3"  (1.6 m)    Weight:   Wt Readings from Last 1 Encounters:  11/09/22 37.2 kg    Ideal Body Weight:  52.3 kg  BMI:  Body mass index is 14.53 kg/m.  Estimated Nutritional Needs:   Kcal:  1450-1650  Protein:  60-75 grams  Fluid:  > 1.4 L    Levada Schilling, RD, LDN, CDCES Registered Dietitian II Certified Diabetes Care and Education Specialist Please refer to [facility] for RD and/or RD on-call/weekend/after hours pager

## 2022-11-19 DIAGNOSIS — R531 Weakness: Secondary | ICD-10-CM | POA: Diagnosis not present

## 2022-11-19 DIAGNOSIS — Z515 Encounter for palliative care: Secondary | ICD-10-CM | POA: Diagnosis not present

## 2022-11-19 DIAGNOSIS — C50919 Malignant neoplasm of unspecified site of unspecified female breast: Secondary | ICD-10-CM | POA: Diagnosis not present

## 2022-11-19 DIAGNOSIS — C7951 Secondary malignant neoplasm of bone: Secondary | ICD-10-CM | POA: Diagnosis not present

## 2022-11-19 DIAGNOSIS — D649 Anemia, unspecified: Secondary | ICD-10-CM | POA: Diagnosis not present

## 2022-11-19 DIAGNOSIS — E43 Unspecified severe protein-calorie malnutrition: Secondary | ICD-10-CM | POA: Diagnosis not present

## 2022-11-19 DIAGNOSIS — Z170 Estrogen receptor positive status [ER+]: Secondary | ICD-10-CM | POA: Diagnosis not present

## 2022-11-19 LAB — CBC
HCT: 29.6 % — ABNORMAL LOW (ref 36.0–46.0)
Hemoglobin: 8.6 g/dL — ABNORMAL LOW (ref 12.0–15.0)
MCH: 23.4 pg — ABNORMAL LOW (ref 26.0–34.0)
MCHC: 29.1 g/dL — ABNORMAL LOW (ref 30.0–36.0)
MCV: 80.4 fL (ref 80.0–100.0)
Platelets: 275 10*3/uL (ref 150–400)
RBC: 3.68 MIL/uL — ABNORMAL LOW (ref 3.87–5.11)
RDW: 18.3 % — ABNORMAL HIGH (ref 11.5–15.5)
WBC: 5.5 10*3/uL (ref 4.0–10.5)
nRBC: 0 % (ref 0.0–0.2)

## 2022-11-19 LAB — BASIC METABOLIC PANEL
Anion gap: 8 (ref 5–15)
BUN: 26 mg/dL — ABNORMAL HIGH (ref 8–23)
CO2: 27 mmol/L (ref 22–32)
Calcium: 8.5 mg/dL — ABNORMAL LOW (ref 8.9–10.3)
Chloride: 106 mmol/L (ref 98–111)
Creatinine, Ser: 0.76 mg/dL (ref 0.44–1.00)
GFR, Estimated: >60 mL/min (ref >60)
Glucose, Bld: 86 mg/dL (ref 70–99)
Potassium: 4.4 mmol/L (ref 3.5–5.1)
Sodium: 141 mmol/L (ref 135–145)

## 2022-11-19 LAB — MAGNESIUM: Magnesium: 2.2 mg/dL (ref 1.7–2.4)

## 2022-11-19 NOTE — Plan of Care (Signed)

## 2022-11-19 NOTE — Progress Notes (Signed)
PROGRESS NOTE    Cassandra Patterson  ZOX:[id number] DOB: [birth year] DOA: 11/09/2022 PCP: Leanna Sato, MD   Assessment & Plan:   Principal Problem:   Weakness Active Problems:   AKI (acute kidney injury) (HCC)   Lytic bone lesion of hip   Severe protein-calorie malnutrition (HCC)   Dry skin   Essential hypertension   First degree AV block   Mobitz type 1 second degree AV block   TIA (transient ischemic attack)   History of TIA (transient ischemic attack)   Metastasis to bone (HCC)   Normocytic anemia   Pressure injury of skin   Palliative care encounter   Malignant neoplasm of overlapping sites of left breast in female, estrogen receptor positive (HCC)  Assessment and Plan: Weakness: likely secondary to metastatic cancer. PT/OT recs SNF    Breast cancer: w/ extensive lytic bone lesions w/ metastatic lesions of lumbosacral spine, left ischium, bilateral iliac bones, ribs, pathologic fractures at the inferior left ischial tuberosity, T12, L5 vertebral bodies.  Large palpable breast mass as per onco. CT abd/pelvis shows 3.1 cm soft tissue mass left lateral body wall, 3.7 cm cystic lesion in the left breast. CT chest shows multiple lytic bone metastases in T spine, ribs, 3.1 cm soft tissue lesion in T9 vertebral body destroys right pedicle w/ tumor extension into the spinal cord with multiple other findings, see CT impression.   Pathology showed metastatic moderately differentiated adenocarcinoma consistent with breast primary. Started on letrozole as per onco    AKI: resolved   Anemia of chronic disease: will transfuse if Hb < 7.0   Severe protein-calorie malnutrition: continue on nutritional supplements    Hx of TIA: continue on aspirin   HTN: continue on home dose felodipine    Pressure injury left buttock: stage 3, present on admission. Continue w/ wound care   Partial thickness skin tear to left heel. Continue w/ wound care         DVT prophylaxis: lovenox  Code  Status: DNR Family Communication: discussed pt's care w/ pt's family at bedside  Disposition Plan: PT/OT recs SNF but pt's insurance are refusing SNF. Completed peer to peer, and pt's insurance are still refusing SNF. CM made aware   Level of care: Telemetry Medical Status is: Inpatient Remains inpatient appropriate because: severity of illness     Consultants:  Onco Palliative care  Procedures:   Antimicrobials:    Subjective: Pt c/o fatigue   Objective: Vitals:   11/18/22 0729 11/18/22 1609 11/18/22 2047 11/19/22 0441  BP: (!) 169/68 138/67 135/65 130/68  Pulse: 81 69 63 64  Resp: 18 16    Temp: (!) 97.4 F (36.3 C) (!) 97.5 F (36.4 C) 99 F (37.2 C) 98.2 F (36.8 C)  TempSrc:   Oral Oral  SpO2: 93% 100% 98% 95%  Weight:      Height:       No intake or output data in the 24 hours ending 11/19/22 0733 Filed Weights   11/09/22 1053  Weight: 37.2 kg    Examination:  General exam: Appears calm and comfortable  Respiratory system: Clear to auscultation. Respiratory effort normal. Cardiovascular system: S1 & S2 +. No rubs, gallops or clicks. Gastrointestinal system: Abdomen is nondistended, soft and nontender.  Normal bowel sounds heard. Central nervous system: Alert and awake. Moves all extremities  Psychiatry: Judgement and insight appears poor. Flat mood and affect     Data Reviewed: I have personally reviewed following labs and imaging studies  CBC: Recent Labs  Lab 11/13/22 0545 11/14/22 0722 11/19/22 0454  WBC 6.8 6.1 5.5  HGB 8.0* 8.6* 8.6*  HCT 26.9* 29.2* 29.6*  MCV 78.4* 79.1* 80.4  PLT 232 235 275   Basic Metabolic Panel: Recent Labs  Lab 11/13/22 0545 11/14/22 0722 11/19/22 0454  NA 140 142 141  K 4.2 4.2 4.4  CL 113* 113* 106  CO2 22 22 27   GLUCOSE 75 84 86  BUN 24* 24* 26*  CREATININE 0.83 0.82 0.76  CALCIUM 8.3* 8.4* 8.5*  MG 2.1 2.2 2.2   GFR: Estimated Creatinine Clearance: 26.4 mL/min (by C-G formula based on SCr of  0.76 mg/dL). Liver Function Tests: No results for input(s): "AST", "ALT", "ALKPHOS", "BILITOT", "PROT", "ALBUMIN" in the last 168 hours. No results for input(s): "LIPASE", "AMYLASE" in the last 168 hours. No results for input(s): "AMMONIA" in the last 168 hours. Coagulation Profile: No results for input(s): "INR", "PROTIME" in the last 168 hours. Cardiac Enzymes: No results for input(s): "CKTOTAL", "CKMB", "CKMBINDEX", "TROPONINI" in the last 168 hours. BNP (last 3 results) No results for input(s): "PROBNP" in the last 8760 hours. HbA1C: No results for input(s): "HGBA1C" in the last 72 hours. CBG: No results for input(s): "GLUCAP" in the last 168 hours. Lipid Profile: No results for input(s): "CHOL", "HDL", "LDLCALC", "TRIG", "CHOLHDL", "LDLDIRECT" in the last 72 hours. Thyroid Function Tests: No results for input(s): "TSH", "T4TOTAL", "FREET4", "T3FREE", "THYROIDAB" in the last 72 hours. Anemia Panel: Recent Labs    11/16/22 1820  VITAMINB12 2,065*  FOLATE 23.0   Sepsis Labs: No results for input(s): "PROCALCITON", "LATICACIDVEN" in the last 168 hours.  No results found for this or any previous visit (from the past 240 hour(s)).       Radiology Studies: No results found.      Scheduled Meds:  vitamin C  500 mg Oral BID   aspirin EC  81 mg Oral Daily   brimonidine  1 drop Both Eyes BID   calcitonin (salmon)  1 spray Alternating Nares Daily   cholecalciferol  1,000 Units Oral Daily   enoxaparin (LOVENOX) injection  30 mg Subcutaneous QHS   feeding supplement  237 mL Oral TID BM   felodipine  5 mg Oral Daily   hydrocerin  1 Application Topical BID   latanoprost  1 drop Left Eye QHS   Latanoprostene Bunod  1 drop Ophthalmic QHS   letrozole  2.5 mg Oral Daily   multivitamin with minerals  1 tablet Oral Daily   sodium bicarbonate  650 mg Oral BID   zinc sulfate  220 mg Oral Daily   Continuous Infusions:  sodium chloride 10 mL/hr at 11/12/22 1251     LOS: 10  days        Charise Killian, MD [facility] Pager [phone]  If 7PM-7AM, please contact night-coverage [email or web address]  11/19/2022, 7:33 AM

## 2022-11-19 NOTE — Progress Notes (Signed)
Occupational Therapy Treatment Patient Details Name: Cassandra Patterson MRN: [id number] DOB: 02-04-1929 Today's Date: 11/19/2022   History of present illness Pt is a 89 yo female that presented to ED for weakness. PMH of TIA, HTN, mobitz. Imaging showed multiple L rib fractures and further workup showed osseous metastatic disease.   OT comments  Chart reviewed to date, pt greeted in bed with sister present. C o tx completed with PT on this date. Pt is oriented to self and sister in room. Increased time for all simple one step directions with multi modal cues provided throughout. Pt endorses a fear of falling with posterior lean noted in sitting and standing. Tx session targeted improving functional activity tolerance to improve ADL participation. Pt is noted to be soiled in urine, MAX A for peri care and LB bathing. Step pivot transfer to chair with MOD A +2 via HHA. MOD A for oral care/grooming tasks with step by step multi modal cues required for sustained attention to task. Pt is making progress towards goals, discharge recommendation remains appropriate. OT will follow acutely.       If plan is discharge home, recommend the following:  Assistance with cooking/housework;Assist for transportation;Help with stairs or ramp for entrance;Supervision due to cognitive status;Two people to help with walking and/or transfers;Two people to help with bathing/dressing/bathroom   Equipment Recommendations  BSC/3in1    Recommendations for Other Services      Precautions / Restrictions Precautions Precautions: Fall Precaution Comments: multiple sites of metatasic lesions Restrictions Weight Bearing Restrictions: No       Mobility Bed Mobility Overal bed mobility: Needs Assistance Bed Mobility: Supine to Sit     Supine to sit: Max assist, HOB elevated, Used rails          Transfers Overall transfer level: Needs assistance Equipment used: 2 person hand held assist Transfers: Sit to/from  Stand, Bed to chair/wheelchair/BSC Sit to Stand: Mod assist, +2 physical assistance                 Balance Overall balance assessment: Needs assistance Sitting-balance support: Feet supported, Single extremity supported Sitting balance-Leahy Scale: Poor   Postural control: Posterior lean Standing balance support: Bilateral upper extremity supported, During functional activity, Reliant on assistive device for balance Standing balance-Leahy Scale: Poor                             ADL either performed or assessed with clinical judgement   ADL Overall ADL's : Needs assistance/impaired     Grooming: Oral care;Sitting;Moderate assistance Grooming Details (indicate cue type and reason): for denture and oral care for thoroughness; step by step multi modal cues for task participation     Lower Body Bathing: Maximal assistance;Sit to/from stand   Upper Body Dressing : Moderate assistance;Sitting Upper Body Dressing Details (indicate cue type and reason): doff/down gown Lower Body Dressing: Maximal assistance;Sitting/lateral leans Lower Body Dressing Details (indicate cue type and reason): socks Toilet Transfer: Moderate assistance;+2 for physical assistance Toilet Transfer Details (indicate cue type and reason): step pivot to bedside chair, simulated, +2 HHA assist Toileting- Clothing Manipulation and Hygiene: Maximal assistance;Sit to/from stand Toileting - Clothing Manipulation Details (indicate cue type and reason): pt is incontient of urine            Extremity/Trunk Assessment              Vision       Perception     Praxis  Cognition Arousal: Alert Behavior During Therapy: Anxious Overall Cognitive Status: Impaired/Different from baseline Area of Impairment: Orientation, Following commands, Awareness, Problem solving, Safety/judgement, Attention, Memory                 Orientation Level: Disoriented to, Situation Current Attention  Level: Focused Memory: Decreased short-term memory Following Commands: Follows one step commands inconsistently, Follows one step commands with increased time Safety/Judgement: Decreased awareness of deficits Awareness: Intellectual Problem Solving: Slow processing, Decreased initiation, Difficulty sequencing, Requires verbal cues, Requires tactile cues General Comments: oriented to name and famliy in the room, sister reports cognition is not at baseline on this date        Exercises Other Exercises Other Exercises: edu pt and caregiver [id number] of continued mobility, techniques to facilitate improved participation in ADL while hospitalized    Shoulder Instructions       General Comments      Pertinent Vitals/ Pain       Pain Assessment Pain Assessment: PAINAD Breathing: normal Negative Vocalization: occasional moan/groan, low speech, negative/disapproving quality Facial Expression: smiling or inexpressive Body Language: tense, distressed pacing, fidgeting Consolability: no need to console PAINAD Score: 2 Pain Location: grimaces with movement of LEs in bed Pain Descriptors / Indicators: Grimacing Pain Intervention(s): Monitored during session, Repositioned  Home Living                                          Prior Functioning/Environment              Frequency  Min 1X/week        Progress Toward Goals  OT Goals(current goals can now be found in the care plan section)  Progress towards OT goals: Progressing toward goals     Plan      Co-evaluation    PT/OT/SLP Co-Evaluation/Treatment: Yes Reason for Co-Treatment: For patient/therapist safety;To address functional/ADL transfers PT goals addressed during session: Mobility/safety with mobility;Balance OT goals addressed during session: ADL's and self-care;Proper use of Adaptive equipment and DME      AM-PAC OT "6 Clicks" Daily Activity     Outcome Measure   Help from another  person eating meals?: A Little Help from another person taking care of personal grooming?: A Little Help from another person toileting, which includes using toliet, bedpan, or urinal?: A Lot Help from another person bathing (including washing, rinsing, drying)?: A Lot Help from another person to put on and taking off regular upper body clothing?: A Little Help from another person to put on and taking off regular lower body clothing?: A Lot 6 Click Score: 15    End of Session    OT Visit Diagnosis: Other abnormalities of gait and mobility (R26.89);Muscle weakness (generalized) (M62.81)   Activity Tolerance Patient tolerated treatment well   Patient Left in chair;with chair alarm set;with family/visitor present   Nurse Communication Mobility status        Time: 3086-5784 OT Time Calculation (min): 23 min  Charges: OT General Charges $OT Visit: 1 Visit OT Treatments $Self Care/Home Management : 8-22 mins  Oleta Mouse, OTD OTR/L  11/19/22, 12:30 PM

## 2022-11-19 NOTE — TOC Progression Note (Signed)
Transition of Care [facility]) - Progression Note    Patient Details  Name: CHYNAH ORIHUELA MRN: [id number] Date of Birth: Dec 13, 1928  [facility]) CM/SW Contact  Garret Reddish, RN Phone Number: 11/19/2022, 2:12 PM  Clinical Narrative:     Chart reviewed.  I have received a call from [facility] with [employer].  She informs me that the provider has offered a peer to peer with Dr. Lovena Le.  His contact number is [phone].  The deadline is 11-20-22 at 12 noon.  I have informed Dr. Mayford Knife of the above information.    TOC will continue to follow for discharge planning.     Expected Discharge Plan: Skilled Nursing Facility Barriers to Discharge: Continued Medical Work up, SNF Pending bed offer, Insurance Authorization  Expected Discharge Plan and Services                                               Social Determinants of Health (SDOH) Interventions SDOH Screenings   Food Insecurity: Patient Unable To Answer (11/09/2022)  Housing: Patient Unable To Answer (11/09/2022)  Transportation Needs: Patient Unable To Answer (11/09/2022)  Utilities: Patient Unable To Answer (11/09/2022)  Tobacco Use: Low Risk  (11/09/2022)    Readmission Risk Interventions     No data to display

## 2022-11-19 NOTE — Progress Notes (Signed)
Physical Therapy Treatment Patient Details Name: Cassandra Patterson MRN: [id number] DOB: 04-15-1928 Today's Date: 11/19/2022   History of Present Illness Pt is a 89 yo female that presented to ED for weakness. PMH of TIA, HTN, mobitz. Imaging showed multiple L rib fractures and further workup showed osseous metastatic disease.    PT Comments  Patient received in bed, she is confused. Requires repeated multi modal cues during session for mobility and activities. Sister present in room. Patient requires total assist for bed mobility with grimacing when moving LEs. She has heavy posterior leaning in sitting. Patient is able to stand with +2 mod A ( hand held ) and pivot to recliner. She is fearful of falling. Patient will continue to benefit from skilled PT to improve functional independence and strength.      If plan is discharge home, recommend the following: Two people to help with walking and/or transfers;Two people to help with bathing/dressing/bathroom;Direct supervision/assist for medications management;Help with stairs or ramp for entrance;Assist for transportation;Assistance with feeding;Assistance with cooking/housework;Supervision due to cognitive status   Can travel by private vehicle     No  Equipment Recommendations  Rolling walker (2 wheels)    Recommendations for Other Services       Precautions / Restrictions Precautions Precautions: Fall Precaution Comments: multiple sites of metatasic lesions Restrictions Weight Bearing Restrictions: No     Mobility  Bed Mobility Overal bed mobility: Needs Assistance Bed Mobility: Supine to Sit     Supine to sit: Max assist, HOB elevated, Used rails     General bed mobility comments: heavy posterior leaning    Transfers Overall transfer level: Needs assistance Equipment used: 2 person hand held assist Transfers: Sit to/from Stand, Bed to chair/wheelchair/BSC Sit to Stand: Mod assist, +2 physical assistance   Step pivot  transfers: Mod assist, +2 physical assistance       General transfer comment: mod +2 assist for transfer to recliner    Ambulation/Gait               General Gait Details: posterior lean and fear avoidance behavior during mobility   Stairs             Wheelchair Mobility     Tilt Bed    Modified Rankin (Stroke Patients Only)       Balance Overall balance assessment: Needs assistance Sitting-balance support: Feet supported, Single extremity supported Sitting balance-Leahy Scale: Poor   Postural control: Posterior lean Standing balance support: Bilateral upper extremity supported, During functional activity, Reliant on assistive device for balance Standing balance-Leahy Scale: Poor                              Cognition Arousal: Alert Behavior During Therapy: Anxious Overall Cognitive Status: Impaired/Different from baseline Area of Impairment: Orientation, Following commands, Awareness, Problem solving, Safety/judgement                 Orientation Level: Disoriented to, Situation     Following Commands: Follows one step commands inconsistently, Follows one step commands with increased time   Awareness: Intellectual Problem Solving: Slow processing, Decreased initiation, Difficulty sequencing, Requires verbal cues, Requires tactile cues General Comments: pt oriented to name and family in room, extended time and intermittent repetition to follow one step commands        Exercises Other Exercises Other Exercises: LAQ x 5    General Comments        Pertinent Vitals/Pain Pain  Assessment Pain Assessment: Faces Faces Pain Scale: Hurts little more Pain Location: grimaces with movement of LEs in bed Pain Descriptors / Indicators: Grimacing Pain Intervention(s): Monitored during session, Repositioned    Home Living                          Prior Function            PT Goals (current goals can now be found in the  care plan section) Acute Rehab PT Goals PT Goal Formulation: With family Time For Goal Achievement: 11/27/22 Potential to Achieve Goals: Fair Progress towards PT goals: Progressing toward goals    Frequency    Min 1X/week      PT Plan      Co-evaluation PT/OT/SLP Co-Evaluation/Treatment: Yes Reason for Co-Treatment: For patient/therapist safety;To address functional/ADL transfers PT goals addressed during session: Mobility/safety with mobility;Balance        AM-PAC PT "6 Clicks" Mobility   Outcome Measure  Help needed turning from your back to your side while in a flat bed without using bedrails?: A Lot Help needed moving from lying on your back to sitting on the side of a flat bed without using bedrails?: Total Help needed moving to and from a bed to a chair (including a wheelchair)?: A Lot Help needed standing up from a chair using your arms (e.g., wheelchair or bedside chair)?: A Lot Help needed to walk in hospital room?: Total Help needed climbing 3-5 steps with a railing? : Total 6 Click Score: 9    End of Session   Activity Tolerance: Other (comment) (limited by fear/anxiety) Patient left: in chair;with call bell/phone within reach;with chair alarm set;with family/visitor present Nurse Communication: Mobility status PT Visit Diagnosis: Other abnormalities of gait and mobility (R26.89);Muscle weakness (generalized) (M62.81);Unsteadiness on feet (R26.81)     Time: 1610-9604 PT Time Calculation (min) (ACUTE ONLY): 21 min  Charges:    $Therapeutic Activity: 8-22 mins PT General Charges $$ ACUTE PT VISIT: 1 Visit                     Latunya Kissick, PT, GCS 11/19/22,12:05 PM

## 2022-11-19 NOTE — Progress Notes (Signed)
Hematology/Oncology Progress note [phone] [phone]     Patient Care Team: Leanna Sato, MD as PCP - General (Family Medicine) End, Cristal Deer, MD as PCP - Cardiology (Cardiology)   Name of the patient: Cassandra Patterson  [id number]  11-21-1928  Date of visit: 11/19/22   INTERVAL HISTORY-    11/12/2022 status post left breast/chest wall mass biopsy by IR. Pathology showed metastatic moderately differentiated adenocarcinoma consistent with breast primary.  ER positive, GATA3 positive.  PR and HER2 immunohistochemistry staining is pending. ER 90% positive, ER 90% positive, HER2 negative.   11/18/2022 started on Letrozole 2.5mg  daily.     No Known Allergies  Patient Active Problem List   Diagnosis Date Noted   Palliative care encounter 11/14/2022   Malignant neoplasm of overlapping sites of left breast in female, estrogen receptor positive (HCC) 11/14/2022   Metastasis to bone (HCC) 11/10/2022   Normocytic anemia 11/10/2022   Pressure injury of skin 11/10/2022   Weakness 11/09/2022   Severe protein-calorie malnutrition (HCC) 11/09/2022   AKI (acute kidney injury) (HCC) 11/09/2022   Lytic bone lesion of hip 11/09/2022   Dry skin 11/09/2022   History of TIA (transient ischemic attack) 05/01/2021   TIA (transient ischemic attack) 01/06/2021   Mobitz type 1 second degree AV block 02/22/2020   Palpitations 01/26/2020   Heart murmur 01/26/2020   Essential hypertension 01/26/2020   Bradycardia 01/26/2020   First degree AV block 01/26/2020     Past Medical History:  Diagnosis Date   Cataracts, bilateral    Glaucoma    Hypertension    Mobitz type 1 second degree atrioventricular block    TIA (transient ischemic attack)      Past Surgical History:  Procedure Laterality Date   CATARACT EXTRACTION      Social History   Socioeconomic History   Marital status: Married    Spouse name: Not on file   Number of children: Not on file    Years of education: Not on file   Highest education level: Not on file  Occupational History   Not on file  Tobacco Use   Smoking status: Never   Smokeless tobacco: Never  Vaping Use   Vaping status: Never Used  Substance and Sexual Activity   Alcohol use: No   Drug use: No   Sexual activity: Not Currently  Other Topics Concern   Not on file  Social History Narrative   Not on file   Social Determinants of Health   Financial Resource Strain: Not on file  Food Insecurity: Patient Unable To Answer (11/09/2022)   Hunger Vital Sign    Worried About Running Out of Food in the Last Year: Patient unable to answer    Ran Out of Food in the Last Year: Patient unable to answer  Transportation Needs: Patient Unable To Answer (11/09/2022)   PRAPARE - Transportation    Lack of Transportation (Medical): Patient unable to answer    Lack of Transportation (Non-Medical): Patient unable to answer  Physical Activity: Not on file  Stress: Not on file  Social Connections: Not on file  Intimate Partner Violence: Patient Unable To Answer (11/09/2022)   Humiliation, Afraid, Rape, and Kick questionnaire    Fear of Current or Ex-Partner: Patient unable to answer    Emotionally Abused: Patient unable to answer    Physically Abused: Patient unable to answer    Sexually Abused: Patient unable to answer     Family History  Problem Relation Age of Onset  Hypertension Mother    Heart disease Neg Hx      Current Facility-Administered Medications:    0.9 %  sodium chloride infusion, , Intravenous, Continuous, Kennieth Francois, PA, Last Rate: 10 mL/hr at 11/12/22 1251, New Bag at 11/12/22 1251   acetaminophen (TYLENOL) tablet 500 mg, 500 mg, Oral, Q6H PRN, Darlin Priestly, MD   ascorbic acid (VITAMIN C) tablet 500 mg, 500 mg, Oral, BID, Charise Killian, MD, 500 mg at 11/19/22 2101   aspirin EC tablet 81 mg, 81 mg, Oral, Daily, Cox, Amy N, DO, 81 mg at 11/19/22 0942   brimonidine (ALPHAGAN) 0.15 %  ophthalmic solution 1 drop, 1 drop, Both Eyes, BID, Cox, Amy N, DO, 1 drop at 11/19/22 2101   calcitonin (salmon) (MIACALCIN/FORTICAL) nasal spray 1 spray, 1 spray, Alternating Nares, Daily, Cox, Amy N, DO, 1 spray at 11/19/22 0951   cholecalciferol (VITAMIN D3) tablet 1,000 Units, 1,000 Units, Oral, Daily, Cox, Amy N, DO, 1,000 Units at 11/19/22 0942   enoxaparin (LOVENOX) injection 30 mg, 30 mg, Subcutaneous, QHS, Cox, Amy N, DO, 30 mg at 11/19/22 2101   feeding supplement (ENSURE ENLIVE / ENSURE PLUS) liquid 237 mL, 237 mL, Oral, TID BM, Darlin Priestly, MD, 237 mL at 11/19/22 2103   felodipine (PLENDIL) 24 hr tablet 5 mg, 5 mg, Oral, Daily, Cox, Amy N, DO, 5 mg at 11/19/22 0942   latanoprost (XALATAN) 0.005 % ophthalmic solution 1 drop, 1 drop, Left Eye, QHS, Cox, Amy N, DO, 1 drop at 11/19/22 2102   Latanoprostene Bunod 0.024 % SOLN 1 drop, 1 drop, Ophthalmic, QHS, Cox, Amy N, DO, 1 drop at 11/19/22 2102   letrozole [facility]) tablet 2.5 mg, 2.5 mg, Oral, Daily, Rickard Patience, MD, 2.5 mg at 11/19/22 0102   multivitamin with minerals tablet 1 tablet, 1 tablet, Oral, Daily, Cox, Amy N, DO, 1 tablet at 11/19/22 0942   senna-docusate (Senokot-S) tablet 1 tablet, 1 tablet, Oral, QHS PRN, Cox, Amy N, DO, 1 tablet at 11/17/22 0930   sodium bicarbonate tablet 650 mg, 650 mg, Oral, BID, Charise Killian, MD, 650 mg at 11/19/22 2101   zinc sulfate capsule 220 mg, 220 mg, Oral, Daily, Charise Killian, MD, 220 mg at 11/19/22 0942   Physical exam:  Vitals:   11/19/22 0441 11/19/22 0832 11/19/22 1610 11/19/22 2045  BP: 130/68 131/61 (!) 165/89 138/77  Pulse: 64 75 (!) 103 72  Resp:  18 18 18   Temp: 98.2 F (36.8 C) 98.8 F (37.1 C) 98.2 F (36.8 C) 98.2 F (36.8 C)  TempSrc: Oral     SpO2: 95% 96% 93% 99%  Weight:      Height:       Physical Exam Constitutional:      Appearance: She is ill-appearing.  HENT:     Head: Normocephalic.  Eyes:     General: No scleral icterus. Cardiovascular:      Rate and Rhythm: Normal rate.  Pulmonary:     Effort: Pulmonary effort is normal. No respiratory distress.  Abdominal:     General: Abdomen is flat.  Musculoskeletal:     Cervical back: Normal range of motion.     Right lower leg: No edema.     Left lower leg: No edema.  Skin:    General: Skin is warm and dry.  Neurological:     Mental Status: She is alert. Mental status is at baseline.  Psychiatric:        Mood and Affect: Mood  normal.     Palpable left breast mass, axillary lymphadenopathy  Labs    Latest Ref Rng & Units 11/19/2022    4:54 AM 11/14/2022    7:22 AM 11/13/2022    5:45 AM  CBC  WBC 4.0 - 10.5 K/uL 5.5  6.1  6.8   Hemoglobin 12.0 - 15.0 g/dL 8.6  8.6  8.0   Hematocrit 36.0 - 46.0 % 29.6  29.2  26.9   Platelets 150 - 400 K/uL 275  235  232       Latest Ref Rng & Units 11/19/2022    4:54 AM 11/14/2022    7:22 AM 11/13/2022    5:45 AM  CMP  Glucose 70 - 99 mg/dL 86  84  75   BUN 8 - 23 mg/dL 26  24  24    Creatinine 0.44 - 1.00 mg/dL 4.09  8.11  9.14   Sodium 135 - 145 mmol/L 141  142  140   Potassium 3.5 - 5.1 mmol/L 4.4  4.2  4.2   Chloride 98 - 111 mmol/L 106  113  113   CO2 22 - 32 mmol/L 27  22  22    Calcium 8.9 - 10.3 mg/dL 8.5  8.4  8.3      RADIOGRAPHIC STUDIES: I have personally reviewed the radiological images as listed and agreed with the findings in the report. CT ASPIRATION N/S  Result Date: 11/12/2022 INDICATION: 89 year old female referred for biopsy L5 vertebral body, with inability to position on left decubitus or prone. We will proceed after speaking with oncology team in the patient's family with a left chest wall/breast mass biopsy EXAM: CT BIOPSY PUNCTURE ASPIRATION SUPERFICIAL FLUID MEDICATIONS: None. ANESTHESIA/SEDATION: Moderate (conscious) sedation was not employed during this procedure. A total of Versed 0.5 mg and Fentanyl 0 mcg was administered intravenously. Moderate Sedation Time: 0 minutes. The patient's level of consciousness  and vital signs were monitored continuously by radiology nursing throughout the procedure under my direct supervision. FLUOROSCOPY TIME:  CT COMPLICATIONS: None PROCEDURE: Informed written consent was obtained from the patient after a thorough discussion of the procedural risks, benefits and alternatives. All questions were addressed. Maximal Sterile Barrier Technique was utilized including caps, mask, sterile gowns, sterile gloves, sterile drape, hand hygiene and skin antiseptic. A timeout was performed prior to the initiation of the procedure. Patient was positioned supine on the CT gantry table. Scout CT of the chest performed for planning purposes. The patient is prepped and draped in the usual sterile fashion. 1% lidocaine was used for local anesthesia. Using CT guidance, guide needle was advanced into the solid mass of the left chest wall/breast. Once we confirmed needle tip position multiple 18 gauge core biopsy were acquired. Needle was removed. Final CT was acquired. Manual pressure was used for hemostasis. Sterile bandage was placed. Patient tolerated the procedure well and remained hemodynamically stable throughout. No complications were encountered and no significant blood loss. IMPRESSION: Status post CT-guided biopsy of left chest wall mass/breast mass. Signed, Yvone Neu. Miachel Roux, RPVI [facility] Radiology Electronically Signed   By: Gilmer Mor D.O.   On: 11/12/2022 16:07   MR THORACIC SPINE W WO CONTRAST  Result Date: 11/11/2022 CLINICAL DATA:  Metastatic disease evaluation.  Weakness. EXAM: MRI THORACIC WITHOUT AND WITH CONTRAST TECHNIQUE: Multiplanar and multiecho pulse sequences of the thoracic spine were obtained without and with intravenous contrast. CONTRAST:  4mL GADAVIST GADOBUTROL 1 MMOL/ML IV SOLN COMPARISON:  CT chest 11/10/2022  FINDINGS: Multiple sequences are moderately motion degraded. Alignment: Increased thoracic kyphosis.  Thoracic dextroscoliosis. No significant listhesis. Vertebrae: Widespread enhancing lesions throughout the thoracic spine as well as the cervical and included upper lumbar spine. Large destructive lesion involving the right aspect of the T9 vertebral body and pedicle as seen on CT with right-sided ventral and lateral epidural tumor not resulting in significant spinal stenosis or spinal cord mass effect. Small volume left-sided ventral epidural tumor at T2 without stenosis. T6 and T8 compression fractures with 50% anterior vertebral body height loss. Multiple rib lesions and rib fractures, better demonstrated on the prior CT. Cord:  Normal signal and morphology. Paraspinal and other soft tissues: Small bilateral pleural effusions. Disc levels: Mild thoracic spondylosis. No spinal stenosis. Moderate right neural foraminal encroachment at T9-10 due to tumor. IMPRESSION: 1. Widespread osseous metastases. 2. Epidural tumor at T2 and T9 without significant spinal stenosis. 3. T6 and T8 compression fractures. Electronically Signed   By: Sebastian Ache M.D.   On: 11/11/2022 18:34   CT CHEST WO CONTRAST  Result Date: 11/11/2022 CLINICAL DATA:  Breast cancer staging.  * Tracking Code: BO * EXAM: CT CHEST WITHOUT CONTRAST TECHNIQUE: Multidetector CT imaging of the chest was performed following the standard protocol without IV contrast. RADIATION DOSE REDUCTION: This exam was performed according to the departmental dose-optimization program which includes automated exposure control, adjustment of the mA and/or kV according to patient size and/or use of iterative reconstruction technique. COMPARISON:  None Available. FINDINGS: Cardiovascular: The heart is markedly enlarged. No substantial pericardial effusion. Ascending thoracic aorta measures on the order of 4.2 cm diameter. Descending thoracic aorta measures 3.8 cm diameter. Enlargement of the pulmonary outflow tract/main pulmonary arteries suggests pulmonary arterial  hypertension. There is moderate atherosclerotic calcification of the abdominal aorta without aneurysm. Mediastinum/Nodes: 1.6 cm nodule identified right thyroid lobe. Upper normal mediastinal lymph nodes evident although assessment limited by lack of mediastinal fat and lack of intravenous contrast material. No evidence for gross hilar lymphadenopathy although assessment is limited by the lack of intravenous contrast on the current study. The esophagus has normal imaging features. No right axillary lymphadenopathy. Left axilla incompletely visualized. 10 mm short axis left subpectoral lymph node seen on image 49/2 with probable greater than 18 mm short axis left axillary node on 76/2, incompletely visualized. Left breast is been incompletely visualized with greater than 3 cm homogeneous well-defined incompletely visualized structure noted on image 110/2. Lungs/Pleura: Centrilobular emphsyema noted. Perifissural nodule measuring 4 mm noted right lung on 60/4 with additional 4-5 mm nodules in the right lung on images 68 and 89 of series 4. There is dependent collapse/consolidation in both lower lungs with small bilateral pleural effusions. Upper Abdomen: Unremarkable on noncontrast imaging. Musculoskeletal: Multiple lytic bone metastases evident in the thoracic spine and ribs including posterior right second rib on image 3/series 2. Metastatic lesion in the posterior T2 vertebral body erodes the posterior cortex. 3.1 cm soft tissue lesion in the T9 vertebral body destroys the right pedicle and posterior cortex of the vertebral body with tumor extension into the spinal canal (see image 68/2). Chronic fracture nonunion noted in the right clavicle pathologic fracture noted posterior right ninth and tenth ribs as well as the posterior left twelfth in tenth ribs. Multiple nonacute left-sided rib fractures evident. IMPRESSION: 1. Multiple lytic bone metastases in the thoracic spine and ribs. 3.1 cm soft tissue lesion in the  T9 vertebral body destroys the right pedicle and posterior cortex of the vertebral body with tumor  extension into the spinal canal. MRI of the thoracic spine with and without contrast recommended to further evaluate. 2. Metastatic lesion in the T2 vertebral body destroys the posterior cortex on sagittal imaging suggests extension in the anterior spinal canal. 3. Pathologic fractures of the posterior right ninth and tenth ribs as well as the posterior left twelfth and tenth ribs. Innumerable thoracic spine and rib metastases evident. 4. Left axillary and subpectoral lymphadenopathy, incompletely visualized. 5. 1.6 cm right thyroid lobe nodule. In the setting of significant comorbidities or limited life expectancy, no follow-up recommended (ref: J Am Coll Radiol. 2015 Feb;12(2): 143-50). 6. Small bilateral pleural effusions with dependent collapse/consolidation in both lower lungs. 7. Tiny right lung nodules.  Metastatic disease not excluded. 8. Aortic Atherosclerosis (ICD10-I70.0) and Emphysema (ICD10-J43.9). Electronically Signed   By: Kennith Center M.D.   On: 11/11/2022 10:29   CT ABDOMEN PELVIS W CONTRAST  Result Date: 11/09/2022 CLINICAL DATA:  Acute abdominal pain EXAM: CT ABDOMEN AND PELVIS WITH CONTRAST TECHNIQUE: Multidetector CT imaging of the abdomen and pelvis was performed using the standard protocol following bolus administration of intravenous contrast. RADIATION DOSE REDUCTION: This exam was performed according to the departmental dose-optimization program which includes automated exposure control, adjustment of the mA and/or kV according to patient size and/or use of iterative reconstruction technique. CONTRAST:  75mL OMNIPAQUE IOHEXOL 300 MG/ML  SOLN COMPARISON:  None Available. FINDINGS: Lower chest: T9 vertebral body lytic metastasis. Multiple lytic rib metastases. 3.1 cm soft tissue mass left lateral body wall. No pleural or pericardial effusion. Dependent atelectasis in the lung bases. 3.7 cm  cystic lesion in the left breast. Hepatobiliary: Subcentimeter calcified stone in the dependent aspect of the nondilated gallbladder. No focal liver lesion or biliary ductal dilatation. Pancreas: Unremarkable. No pancreatic ductal dilatation or surrounding inflammatory changes. Spleen: Normal in size without focal abnormality. Adrenals/Urinary Tract: No adrenal mass. Symmetric renal parenchymal enhancement without focal lesion or evident urolithiasis. Urinary bladder nondistended. Stomach/Bowel: Stomach decompressed. Small bowel nondilated. Normal appendix. The colon is partially distended, without acute finding. Vascular/Lymphatic: Moderate scattered calcified aortoiliac atheromatous plaque without aneurysm or stenosis. Portal vein patent. No abdominal or pelvic adenopathy. Reproductive: Status post hysterectomy. No adnexal masses. Other: No ascites.  No free air. Musculoskeletal: Multiple lytic bone lesions throughout the lumbosacral spine, left ischium, bilateral iliac bones. Lytic lesion in the inferior left ischial tuberosity with pathologic fracture, minimally displaced. Pathologic compression deformity of L5 with a greater than 50% loss of height. Mild compression deformity of T12. fixation hardware in the posterior left acetabulum. IMPRESSION: 1. Multiple lytic bone lesions throughout the lumbosacral spine, left ischium, bilateral iliac bones, and ribs. Pathologic fractures of the inferior left ischial tuberosity, T12 and L5 vertebral bodies. 2. 3.1 cm soft tissue mass left lateral body wall. 3. 3.7 cm cystic lesion in the left breast. 4. Cholelithiasis. 5.  Aortic Atherosclerosis (ICD10-I70.0). Electronically Signed   By: Corlis Leak M.D.   On: 11/09/2022 13:53   CT Head Wo Contrast  Result Date: 11/09/2022 CLINICAL DATA:  Mental status change with unknown cause. Neck trauma EXAM: CT HEAD WITHOUT CONTRAST CT CERVICAL SPINE WITHOUT CONTRAST TECHNIQUE: Multidetector CT imaging of the head and cervical  spine was performed following the standard protocol without intravenous contrast. Multiplanar CT image reconstructions of the cervical spine were also generated. RADIATION DOSE REDUCTION: This exam was performed according to the departmental dose-optimization program which includes automated exposure control, adjustment of the mA and/or kV according to patient size and/or use of iterative  reconstruction technique. COMPARISON:  Brain MRI 01/07/2021 FINDINGS: CT HEAD FINDINGS Brain: No evidence of acute infarction, hemorrhage, hydrocephalus, extra-axial collection or mass lesion/mass effect. Small right cerebellar infarct, chronic by prior MRI. Generalized brain atrophy. Vascular: No hyperdense vessel or unexpected calcification. Skull: Multiple lytic lesions scattered throughout the calvarium. Parasagittal right frontal and parasagittal left parietal deposits erode the inner table. Additional notable lesion in the left sphenoid wing eroding the inner table. Sinuses/Orbits: No acute finding CT CERVICAL SPINE FINDINGS Alignment: Normal Skull base and vertebrae: No acute fracture. Multiple lytic bone lesions seen in the C3, C6, C7, T1, T2, and T3 bodies. Notable posterior element deposits at T1-T4. Partially covered nonacute right clavicle fracture with bulky callus that could obscure underlying lesion. Soft tissues and spinal canal: No prevertebral fluid or swelling. No visible canal hematoma. Disc levels:  Ordinary degenerative spurring. Upper chest: Sizable metastatic deposit at the right second rib posteriorly. IMPRESSION: Widespread osseous metastatic disease including deposits that erodes through the inner table. No acute intracranial finding.  No visible brain metastasis. Electronically Signed   By: Tiburcio Pea M.D.   On: 11/09/2022 11:45   CT Cervical Spine Wo Contrast  Result Date: 11/09/2022 CLINICAL DATA:  Mental status change with unknown cause. Neck trauma EXAM: CT HEAD WITHOUT CONTRAST CT CERVICAL  SPINE WITHOUT CONTRAST TECHNIQUE: Multidetector CT imaging of the head and cervical spine was performed following the standard protocol without intravenous contrast. Multiplanar CT image reconstructions of the cervical spine were also generated. RADIATION DOSE REDUCTION: This exam was performed according to the departmental dose-optimization program which includes automated exposure control, adjustment of the mA and/or kV according to patient size and/or use of iterative reconstruction technique. COMPARISON:  Brain MRI 01/07/2021 FINDINGS: CT HEAD FINDINGS Brain: No evidence of acute infarction, hemorrhage, hydrocephalus, extra-axial collection or mass lesion/mass effect. Small right cerebellar infarct, chronic by prior MRI. Generalized brain atrophy. Vascular: No hyperdense vessel or unexpected calcification. Skull: Multiple lytic lesions scattered throughout the calvarium. Parasagittal right frontal and parasagittal left parietal deposits erode the inner table. Additional notable lesion in the left sphenoid wing eroding the inner table. Sinuses/Orbits: No acute finding CT CERVICAL SPINE FINDINGS Alignment: Normal Skull base and vertebrae: No acute fracture. Multiple lytic bone lesions seen in the C3, C6, C7, T1, T2, and T3 bodies. Notable posterior element deposits at T1-T4. Partially covered nonacute right clavicle fracture with bulky callus that could obscure underlying lesion. Soft tissues and spinal canal: No prevertebral fluid or swelling. No visible canal hematoma. Disc levels:  Ordinary degenerative spurring. Upper chest: Sizable metastatic deposit at the right second rib posteriorly. IMPRESSION: Widespread osseous metastatic disease including deposits that erodes through the inner table. No acute intracranial finding.  No visible brain metastasis. Electronically Signed   By: Tiburcio Pea M.D.   On: 11/09/2022 11:45   DG Chest 2 View  Result Date: 11/09/2022 CLINICAL DATA:  Chest pain.  Back and foot  wounds. EXAM: CHEST - 2 VIEW COMPARISON:  01/06/2021 FINDINGS: Moderate cardiomegaly again noted. Ectasia and atherosclerotic calcification of the thoracic aorta again seen. Both lungs remain clear. No evidence of pneumothorax or pleural effusion. Several displaced left lateral rib fracture deformities are seen several displaced rib fractures are seen involving the left lateral 4th through 9th ribs, at least 1 of which shows periosteal new bone formation. IMPRESSION: Moderate cardiomegaly. No active lung disease. Multiple left lateral rib fractures, at least 1 of which shows periosteal new bone formation. Electronically Signed   By: Mayra Neer  Eppie Gibson M.D.   On: 11/09/2022 11:16    Assessment and plan-   # Metastatic breast cancer with extensive bone metastasis, including epidural tumor at T2 and T9, compression fracture at T6 and T8. Pathology results were reviewed and discussed with patient's sisters. I recommend palliative radiation to her thoracic spine epidural tumor. Breast cancer is ER/PR strongly positive and HER2 negative.  Started on Letrozole 2.5mg  daily, so far she tolerates.   Epidural tumor at T2 and T9  If family is interested in palliative radiation, she will establish care with radiation oncology for evaluation   # Normocytic anemia, She has microcytic orders iron panel is not consistent with iron deficiency anemia. normal B12 and folate retic panel.. She has increased nRBC Could be due to marrow involvement  Please transfuse PRBC to keep hemoglobin above 7.  Goals of care discussion.  Sister POA understands that patient's poor due to poor performance status, frailty, malnutrition.  Her disease is not curable. She is no candidate for aggressive treatments except endocrinology therapy. Palliative care service on board  Thank you for allowing me to participate in the care of this patient  Rickard Patience, MD, PhD Hematology Oncology 11/19/2022

## 2022-11-20 DIAGNOSIS — E43 Unspecified severe protein-calorie malnutrition: Secondary | ICD-10-CM | POA: Diagnosis not present

## 2022-11-20 DIAGNOSIS — D649 Anemia, unspecified: Secondary | ICD-10-CM | POA: Diagnosis not present

## 2022-11-20 DIAGNOSIS — C7951 Secondary malignant neoplasm of bone: Secondary | ICD-10-CM | POA: Diagnosis not present

## 2022-11-20 DIAGNOSIS — R531 Weakness: Secondary | ICD-10-CM | POA: Diagnosis not present

## 2022-11-20 MED ORDER — AMOXICILLIN-POT CLAVULANATE 500-125 MG PO TABS
1.0000 | ORAL_TABLET | Freq: Two times a day (BID) | ORAL | Status: DC
  Administered 2022-11-20 – 2022-11-26 (×13): 1 via ORAL
  Filled 2022-11-20 (×13): qty 1

## 2022-11-20 NOTE — Progress Notes (Signed)
Nutrition Follow-up  DOCUMENTATION CODES:   Severe malnutrition in context of chronic illness, Underweight  INTERVENTION:   -Continue Ensure Enlive po TID, each supplement provides 350 kcal and 20 grams of protein.  -Continue Magic cup BID with meals, each supplement provides 290 kcal and 9 grams of protein  -Continue MVI with minerals daily -Continue regular diet -Continue 500 mg vitamin C BID -Continue 220 mg zinc sulfate daily x 14 days   NUTRITION DIAGNOSIS:   Severe Malnutrition related to chronic illness (concern for widespread metastatic disease) as evidenced by moderate muscle depletion, severe muscle depletion, moderate fat depletion, severe fat depletion, percent weight loss.  Ongoing  GOAL:   Patient will meet greater than or equal to 90% of their needs  Progressing   MONITOR:   PO intake, Supplement acceptance  REASON FOR ASSESSMENT:   Consult Assessment of nutrition requirement/status  ASSESSMENT:   Pt with history of Mobitz type I second-degree heart block, history of TIA, history of hypertension, who presents for chief concerns of worsening generalized weakness.  9/25- s/p CT guided biopsy of left chest wall/breast mass   Reviewed I/O's: +120 ml x 24 hours and +387 ml since admission   Palliative care following; pt with metastatic breast cancer with extensive bone metastasis, including epidural tumor at T2 and T9, compression fracture at T6 and T8. Oncology recommending palliative radiation therapy. Goals of care discussions ongoing. Prognosis is poor. Family still discussing, but want to hold off on treatment for now and may transition to comfort care if pt continues to decline.   Pot with fair oral intake. Noted meal completions 25-100%. She is drinking Ensure supplements.   No new wt since last visit.   Pt may d/c to SNF based upon further goals of care discussions.   Medications reviewed.  Labs reviewed: CBGS: 215 (inpatient orders for glycemic  control are none).    Diet Order:   Diet Order             Diet regular Room service appropriate? Yes; Fluid consistency: Thin  Diet effective now                   EDUCATION NEEDS:   No education needs have been identified at this time  Skin:  Skin Assessment: Skin Integrity Issues: Skin Integrity Issues:: Stage III, Other (Comment) Stage III: sacrum, buttocks Other: lt heel laceration  Last BM:  11/17/22 (type 4)  Height:   Ht Readings from Last 1 Encounters:  11/09/22 5\' 3"  (1.6 m)    Weight:   Wt Readings from Last 1 Encounters:  11/09/22 37.2 kg    Ideal Body Weight:  52.3 kg  BMI:  Body mass index is 14.53 kg/m.  Estimated Nutritional Needs:   Kcal:  1450-1650  Protein:  60-75 grams  Fluid:  > 1.4 L    Levada Schilling, RD, LDN, CDCES Registered Dietitian III Certified Diabetes Care and Education Specialist Please refer to [facility] for RD and/or RD on-call/weekend/after hours pager

## 2022-11-20 NOTE — Consult Note (Addendum)
WOC Nurse Consult Note: L heel charted on admission as skin tear after patient fell at home  Reason for Consult: assess L heel  Wound type: Full thickness ? Traumatic  Pressure Injury POA: NA  Measurement: 7 cm x 6 cm total area with approximately 4 cm x 4 area of black soft foul smelling eschar  Wound bed: 50% pink moist 50% black soft eschar  Drainage (amount, consistency, odor) minimal foul smelling exudate  Periwound: peeling skin Dressing procedure/placement/frequency: Cleanse with Vashe wound cleanser [employer] [place] [phone]) and then apply Vashe moistened gauze to wound bed 2 times daily, cover with dry gauze and secure with Kerlix roll gauze.   If aggressive treatment is desired this patient would warrant a CT scan to rule out osteomyelitis and referral to podiatry or orthopedics for further evaluation of this wound.   Discussed with bedside nurse, family in room and primary MD.    This RN performed Vashe wet to dry dressing at today's visit.  Also ordered Prevalon boot for this foot to offload any pressure when in bed.    WOC team will not follow. Re-consult if further needs arise.   Thank you,    Priscella Mann MSN, RN-BC, [employer] [phone]

## 2022-11-20 NOTE — Plan of Care (Signed)
  Problem: Clinical Measurements: Goal: Will remain free from infection Outcome: Progressing Goal: Diagnostic test results will improve Outcome: Progressing   Problem: Coping: Goal: Level of anxiety will decrease Outcome: Progressing   Problem: Elimination: Goal: Will not experience complications related to bowel motility Outcome: Progressing   Problem: Pain Managment: Goal: General experience of comfort will improve Outcome: Progressing

## 2022-11-20 NOTE — Progress Notes (Signed)
Palliative Medicine [facility] at [facility] Telephone:([phone]) [phone] [phone]   Name: Cassandra Patterson Date: 11/20/2022 MRN: [id number]  DOB: May 06, 1928  Patient Care Team: Leanna Sato, MD as PCP - General (Family Medicine) End, Cristal Deer, MD as PCP - Cardiology (Cardiology)    REASON FOR CONSULTATION: Cassandra Patterson is a 89 y.o. female with multiple medical problems including history of TIA, hypertension, and first-degree heart block, who presented to the hospital for evaluation of weakness.  CTs reveal widespread osseous metastatic disease with pathologic fractures of the inferior left ischial tuberosity, T12, and L5 vertebral bodies.  Patient also found to have a large palpable breast mass and underwent biopsy of same.  Palliative care was consulted to address goals and manage ongoing symptoms.    CODE STATUS: DNR  PAST MEDICAL HISTORY: Past Medical History:  Diagnosis Date   Cataracts, bilateral    Glaucoma    Hypertension    Mobitz type 1 second degree atrioventricular block    TIA (transient ischemic attack)     PAST SURGICAL HISTORY:  Past Surgical History:  Procedure Laterality Date   CATARACT EXTRACTION      HEMATOLOGY/ONCOLOGY HISTORY:  Oncology History   No history exists.    ALLERGIES:  has No Known Allergies.  MEDICATIONS:  Current Facility-Administered Medications  Medication Dose Route Frequency Provider Last Rate Last Admin   0.9 %  sodium chloride infusion   Intravenous Continuous Kennieth Francois, PA 10 mL/hr at 11/12/22 1251 New Bag at 11/12/22 1251   acetaminophen (TYLENOL) tablet 500 mg  500 mg Oral Q6H PRN Darlin Priestly, MD       ascorbic acid (VITAMIN C) tablet 500 mg  500 mg Oral BID Charise Killian, MD   500 mg at 11/19/22 2101   aspirin EC tablet 81 mg  81 mg Oral Daily Cox, Amy N, DO   81 mg at 11/19/22 0942   brimonidine (ALPHAGAN) 0.15 % ophthalmic solution 1 drop  1 drop Both Eyes BID  Cox, Amy N, DO   1 drop at 11/19/22 2101   calcitonin (salmon) (MIACALCIN/FORTICAL) nasal spray 1 spray  1 spray Alternating Nares Daily Cox, Amy N, DO   1 spray at 11/19/22 0951   cholecalciferol (VITAMIN D3) tablet 1,000 Units  1,000 Units Oral Daily Cox, Amy N, DO   1,000 Units at 11/19/22 0942   enoxaparin (LOVENOX) injection 30 mg  30 mg Subcutaneous QHS Cox, Amy N, DO   30 mg at 11/19/22 2101   feeding supplement (ENSURE ENLIVE / ENSURE PLUS) liquid 237 mL  237 mL Oral TID BM Darlin Priestly, MD   237 mL at 11/19/22 2103   felodipine (PLENDIL) 24 hr tablet 5 mg  5 mg Oral Daily Cox, Amy N, DO   5 mg at 11/19/22 0942   latanoprost (XALATAN) 0.005 % ophthalmic solution 1 drop  1 drop Left Eye QHS Cox, Amy N, DO   1 drop at 11/19/22 2102   Latanoprostene Bunod 0.024 % SOLN 1 drop  1 drop Ophthalmic QHS Cox, Amy N, DO   1 drop at 11/19/22 2102   letrozole [facility]) tablet 2.5 mg  2.5 mg Oral Daily Rickard Patience, MD   2.5 mg at 11/19/22 6213   multivitamin with minerals tablet 1 tablet  1 tablet Oral Daily Cox, Amy N, DO   1 tablet at 11/19/22 0865   senna-docusate (Senokot-S) tablet 1 tablet  1 tablet Oral QHS PRN Cox,  Amy N, DO   1 tablet at 11/17/22 0930   sodium bicarbonate tablet 650 mg  650 mg Oral BID Charise Killian, MD   650 mg at 11/19/22 2101   zinc sulfate capsule 220 mg  220 mg Oral Daily Charise Killian, MD   220 mg at 11/19/22 7425    VITAL SIGNS: BP 127/63 (BP Location: Right Arm)   Pulse (!) 58   Temp 97.7 F (36.5 C)   Resp 17   Ht 5\' 3"  (1.6 m)   Wt 82 lb (37.2 kg)   SpO2 98%   BMI 14.53 kg/m  Filed Weights   11/09/22 1053  Weight: 82 lb (37.2 kg)    Estimated body mass index is 14.53 kg/m as calculated from the following:   Height as of this encounter: 5\' 3"  (1.6 m).   Weight as of this encounter: 82 lb (37.2 kg).  LABS: CBC:    Component Value Date/Time   WBC 5.5 11/19/2022 0454   HGB 8.6 (L) 11/19/2022 0454   HCT 29.6 (L) 11/19/2022 0454   PLT 275  11/19/2022 0454   MCV 80.4 11/19/2022 0454   NEUTROABS 3.2 01/06/2021 2029   LYMPHSABS 1.2 01/06/2021 2029   MONOABS 0.4 01/06/2021 2029   EOSABS 0.2 01/06/2021 2029   BASOSABS 0.0 01/06/2021 2029   Comprehensive Metabolic Panel:    Component Value Date/Time   NA 141 11/19/2022 0454   K 4.4 11/19/2022 0454   CL 106 11/19/2022 0454   CO2 27 11/19/2022 0454   BUN 26 (H) 11/19/2022 0454   CREATININE 0.76 11/19/2022 0454   GLUCOSE 86 11/19/2022 0454   CALCIUM 8.5 (L) 11/19/2022 0454   AST 35 11/12/2022 0704   ALT 19 11/12/2022 0704   ALKPHOS 137 (H) 11/12/2022 0704   BILITOT 0.6 11/12/2022 0704   PROT 5.3 (L) 11/12/2022 0704   ALBUMIN 2.4 (L) 11/12/2022 0704    RADIOGRAPHIC STUDIES: CT ASPIRATION N/S  Result Date: 11/12/2022 INDICATION: 89 year old female referred for biopsy L5 vertebral body, with inability to position on left decubitus or prone. We will proceed after speaking with oncology team in the patient's family with a left chest wall/breast mass biopsy EXAM: CT BIOPSY PUNCTURE ASPIRATION SUPERFICIAL FLUID MEDICATIONS: None. ANESTHESIA/SEDATION: Moderate (conscious) sedation was not employed during this procedure. A total of Versed 0.5 mg and Fentanyl 0 mcg was administered intravenously. Moderate Sedation Time: 0 minutes. The patient's level of consciousness and vital signs were monitored continuously by radiology nursing throughout the procedure under my direct supervision. FLUOROSCOPY TIME:  CT COMPLICATIONS: None PROCEDURE: Informed written consent was obtained from the patient after a thorough discussion of the procedural risks, benefits and alternatives. All questions were addressed. Maximal Sterile Barrier Technique was utilized including caps, mask, sterile gowns, sterile gloves, sterile drape, hand hygiene and skin antiseptic. A timeout was performed prior to the initiation of the procedure. Patient was positioned supine on the CT gantry table. Scout CT of the chest  performed for planning purposes. The patient is prepped and draped in the usual sterile fashion. 1% lidocaine was used for local anesthesia. Using CT guidance, guide needle was advanced into the solid mass of the left chest wall/breast. Once we confirmed needle tip position multiple 18 gauge core biopsy were acquired. Needle was removed. Final CT was acquired. Manual pressure was used for hemostasis. Sterile bandage was placed. Patient tolerated the procedure well and remained hemodynamically stable throughout. No complications were encountered and no significant blood loss. IMPRESSION: Status  post CT-guided biopsy of left chest wall mass/breast mass. Signed, Yvone Neu. Miachel Roux, RPVI [facility] Radiology Electronically Signed   By: Gilmer Mor D.O.   On: 11/12/2022 16:07   MR THORACIC SPINE W WO CONTRAST  Result Date: 11/11/2022 CLINICAL DATA:  Metastatic disease evaluation.  Weakness. EXAM: MRI THORACIC WITHOUT AND WITH CONTRAST TECHNIQUE: Multiplanar and multiecho pulse sequences of the thoracic spine were obtained without and with intravenous contrast. CONTRAST:  4mL GADAVIST GADOBUTROL 1 MMOL/ML IV SOLN COMPARISON:  CT chest 11/10/2022 FINDINGS: Multiple sequences are moderately motion degraded. Alignment: Increased thoracic kyphosis. Thoracic dextroscoliosis. No significant listhesis. Vertebrae: Widespread enhancing lesions throughout the thoracic spine as well as the cervical and included upper lumbar spine. Large destructive lesion involving the right aspect of the T9 vertebral body and pedicle as seen on CT with right-sided ventral and lateral epidural tumor not resulting in significant spinal stenosis or spinal cord mass effect. Small volume left-sided ventral epidural tumor at T2 without stenosis. T6 and T8 compression fractures with 50% anterior vertebral body height loss. Multiple rib lesions and rib fractures, better demonstrated on the  prior CT. Cord:  Normal signal and morphology. Paraspinal and other soft tissues: Small bilateral pleural effusions. Disc levels: Mild thoracic spondylosis. No spinal stenosis. Moderate right neural foraminal encroachment at T9-10 due to tumor. IMPRESSION: 1. Widespread osseous metastases. 2. Epidural tumor at T2 and T9 without significant spinal stenosis. 3. T6 and T8 compression fractures. Electronically Signed   By: Sebastian Ache M.D.   On: 11/11/2022 18:34   CT CHEST WO CONTRAST  Result Date: 11/11/2022 CLINICAL DATA:  Breast cancer staging.  * Tracking Code: BO * EXAM: CT CHEST WITHOUT CONTRAST TECHNIQUE: Multidetector CT imaging of the chest was performed following the standard protocol without IV contrast. RADIATION DOSE REDUCTION: This exam was performed according to the departmental dose-optimization program which includes automated exposure control, adjustment of the mA and/or kV according to patient size and/or use of iterative reconstruction technique. COMPARISON:  None Available. FINDINGS: Cardiovascular: The heart is markedly enlarged. No substantial pericardial effusion. Ascending thoracic aorta measures on the order of 4.2 cm diameter. Descending thoracic aorta measures 3.8 cm diameter. Enlargement of the pulmonary outflow tract/main pulmonary arteries suggests pulmonary arterial hypertension. There is moderate atherosclerotic calcification of the abdominal aorta without aneurysm. Mediastinum/Nodes: 1.6 cm nodule identified right thyroid lobe. Upper normal mediastinal lymph nodes evident although assessment limited by lack of mediastinal fat and lack of intravenous contrast material. No evidence for gross hilar lymphadenopathy although assessment is limited by the lack of intravenous contrast on the current study. The esophagus has normal imaging features. No right axillary lymphadenopathy. Left axilla incompletely visualized. 10 mm short axis left subpectoral lymph node seen on image 49/2 with  probable greater than 18 mm short axis left axillary node on 76/2, incompletely visualized. Left breast is been incompletely visualized with greater than 3 cm homogeneous well-defined incompletely visualized structure noted on image 110/2. Lungs/Pleura: Centrilobular emphsyema noted. Perifissural nodule measuring 4 mm noted right lung on 60/4 with additional 4-5 mm nodules in the right lung on images 68 and 89 of series 4. There is dependent collapse/consolidation in both lower lungs with small bilateral pleural effusions. Upper Abdomen: Unremarkable on noncontrast imaging. Musculoskeletal: Multiple lytic bone metastases evident in the thoracic spine and ribs including posterior right second rib on image 3/series 2. Metastatic lesion in the posterior T2 vertebral body erodes the posterior cortex. 3.1 cm soft tissue lesion  in the T9 vertebral body destroys the right pedicle and posterior cortex of the vertebral body with tumor extension into the spinal canal (see image 68/2). Chronic fracture nonunion noted in the right clavicle pathologic fracture noted posterior right ninth and tenth ribs as well as the posterior left twelfth in tenth ribs. Multiple nonacute left-sided rib fractures evident. IMPRESSION: 1. Multiple lytic bone metastases in the thoracic spine and ribs. 3.1 cm soft tissue lesion in the T9 vertebral body destroys the right pedicle and posterior cortex of the vertebral body with tumor extension into the spinal canal. MRI of the thoracic spine with and without contrast recommended to further evaluate. 2. Metastatic lesion in the T2 vertebral body destroys the posterior cortex on sagittal imaging suggests extension in the anterior spinal canal. 3. Pathologic fractures of the posterior right ninth and tenth ribs as well as the posterior left twelfth and tenth ribs. Innumerable thoracic spine and rib metastases evident. 4. Left axillary and subpectoral lymphadenopathy, incompletely visualized. 5. 1.6 cm  right thyroid lobe nodule. In the setting of significant comorbidities or limited life expectancy, no follow-up recommended (ref: J Am Coll Radiol. 2015 Feb;12(2): 143-50). 6. Small bilateral pleural effusions with dependent collapse/consolidation in both lower lungs. 7. Tiny right lung nodules.  Metastatic disease not excluded. 8. Aortic Atherosclerosis (ICD10-I70.0) and Emphysema (ICD10-J43.9). Electronically Signed   By: Kennith Center M.D.   On: 11/11/2022 10:29   CT ABDOMEN PELVIS W CONTRAST  Result Date: 11/09/2022 CLINICAL DATA:  Acute abdominal pain EXAM: CT ABDOMEN AND PELVIS WITH CONTRAST TECHNIQUE: Multidetector CT imaging of the abdomen and pelvis was performed using the standard protocol following bolus administration of intravenous contrast. RADIATION DOSE REDUCTION: This exam was performed according to the departmental dose-optimization program which includes automated exposure control, adjustment of the mA and/or kV according to patient size and/or use of iterative reconstruction technique. CONTRAST:  75mL OMNIPAQUE IOHEXOL 300 MG/ML  SOLN COMPARISON:  None Available. FINDINGS: Lower chest: T9 vertebral body lytic metastasis. Multiple lytic rib metastases. 3.1 cm soft tissue mass left lateral body wall. No pleural or pericardial effusion. Dependent atelectasis in the lung bases. 3.7 cm cystic lesion in the left breast. Hepatobiliary: Subcentimeter calcified stone in the dependent aspect of the nondilated gallbladder. No focal liver lesion or biliary ductal dilatation. Pancreas: Unremarkable. No pancreatic ductal dilatation or surrounding inflammatory changes. Spleen: Normal in size without focal abnormality. Adrenals/Urinary Tract: No adrenal mass. Symmetric renal parenchymal enhancement without focal lesion or evident urolithiasis. Urinary bladder nondistended. Stomach/Bowel: Stomach decompressed. Small bowel nondilated. Normal appendix. The colon is partially distended, without acute finding.  Vascular/Lymphatic: Moderate scattered calcified aortoiliac atheromatous plaque without aneurysm or stenosis. Portal vein patent. No abdominal or pelvic adenopathy. Reproductive: Status post hysterectomy. No adnexal masses. Other: No ascites.  No free air. Musculoskeletal: Multiple lytic bone lesions throughout the lumbosacral spine, left ischium, bilateral iliac bones. Lytic lesion in the inferior left ischial tuberosity with pathologic fracture, minimally displaced. Pathologic compression deformity of L5 with a greater than 50% loss of height. Mild compression deformity of T12. fixation hardware in the posterior left acetabulum. IMPRESSION: 1. Multiple lytic bone lesions throughout the lumbosacral spine, left ischium, bilateral iliac bones, and ribs. Pathologic fractures of the inferior left ischial tuberosity, T12 and L5 vertebral bodies. 2. 3.1 cm soft tissue mass left lateral body wall. 3. 3.7 cm cystic lesion in the left breast. 4. Cholelithiasis. 5.  Aortic Atherosclerosis (ICD10-I70.0). Electronically Signed   By: Corlis Leak M.D.   On: 11/09/2022  13:53   CT Head Wo Contrast  Result Date: 11/09/2022 CLINICAL DATA:  Mental status change with unknown cause. Neck trauma EXAM: CT HEAD WITHOUT CONTRAST CT CERVICAL SPINE WITHOUT CONTRAST TECHNIQUE: Multidetector CT imaging of the head and cervical spine was performed following the standard protocol without intravenous contrast. Multiplanar CT image reconstructions of the cervical spine were also generated. RADIATION DOSE REDUCTION: This exam was performed according to the departmental dose-optimization program which includes automated exposure control, adjustment of the mA and/or kV according to patient size and/or use of iterative reconstruction technique. COMPARISON:  Brain MRI 01/07/2021 FINDINGS: CT HEAD FINDINGS Brain: No evidence of acute infarction, hemorrhage, hydrocephalus, extra-axial collection or mass lesion/mass effect. Small right cerebellar  infarct, chronic by prior MRI. Generalized brain atrophy. Vascular: No hyperdense vessel or unexpected calcification. Skull: Multiple lytic lesions scattered throughout the calvarium. Parasagittal right frontal and parasagittal left parietal deposits erode the inner table. Additional notable lesion in the left sphenoid wing eroding the inner table. Sinuses/Orbits: No acute finding CT CERVICAL SPINE FINDINGS Alignment: Normal Skull base and vertebrae: No acute fracture. Multiple lytic bone lesions seen in the C3, C6, C7, T1, T2, and T3 bodies. Notable posterior element deposits at T1-T4. Partially covered nonacute right clavicle fracture with bulky callus that could obscure underlying lesion. Soft tissues and spinal canal: No prevertebral fluid or swelling. No visible canal hematoma. Disc levels:  Ordinary degenerative spurring. Upper chest: Sizable metastatic deposit at the right second rib posteriorly. IMPRESSION: Widespread osseous metastatic disease including deposits that erodes through the inner table. No acute intracranial finding.  No visible brain metastasis. Electronically Signed   By: Tiburcio Pea M.D.   On: 11/09/2022 11:45   CT Cervical Spine Wo Contrast  Result Date: 11/09/2022 CLINICAL DATA:  Mental status change with unknown cause. Neck trauma EXAM: CT HEAD WITHOUT CONTRAST CT CERVICAL SPINE WITHOUT CONTRAST TECHNIQUE: Multidetector CT imaging of the head and cervical spine was performed following the standard protocol without intravenous contrast. Multiplanar CT image reconstructions of the cervical spine were also generated. RADIATION DOSE REDUCTION: This exam was performed according to the departmental dose-optimization program which includes automated exposure control, adjustment of the mA and/or kV according to patient size and/or use of iterative reconstruction technique. COMPARISON:  Brain MRI 01/07/2021 FINDINGS: CT HEAD FINDINGS Brain: No evidence of acute infarction, hemorrhage,  hydrocephalus, extra-axial collection or mass lesion/mass effect. Small right cerebellar infarct, chronic by prior MRI. Generalized brain atrophy. Vascular: No hyperdense vessel or unexpected calcification. Skull: Multiple lytic lesions scattered throughout the calvarium. Parasagittal right frontal and parasagittal left parietal deposits erode the inner table. Additional notable lesion in the left sphenoid wing eroding the inner table. Sinuses/Orbits: No acute finding CT CERVICAL SPINE FINDINGS Alignment: Normal Skull base and vertebrae: No acute fracture. Multiple lytic bone lesions seen in the C3, C6, C7, T1, T2, and T3 bodies. Notable posterior element deposits at T1-T4. Partially covered nonacute right clavicle fracture with bulky callus that could obscure underlying lesion. Soft tissues and spinal canal: No prevertebral fluid or swelling. No visible canal hematoma. Disc levels:  Ordinary degenerative spurring. Upper chest: Sizable metastatic deposit at the right second rib posteriorly. IMPRESSION: Widespread osseous metastatic disease including deposits that erodes through the inner table. No acute intracranial finding.  No visible brain metastasis. Electronically Signed   By: Tiburcio Pea M.D.   On: 11/09/2022 11:45   DG Chest 2 View  Result Date: 11/09/2022 CLINICAL DATA:  Chest pain.  Back and foot wounds. EXAM: CHEST -  2 VIEW COMPARISON:  01/06/2021 FINDINGS: Moderate cardiomegaly again noted. Ectasia and atherosclerotic calcification of the thoracic aorta again seen. Both lungs remain clear. No evidence of pneumothorax or pleural effusion. Several displaced left lateral rib fracture deformities are seen several displaced rib fractures are seen involving the left lateral 4th through 9th ribs, at least 1 of which shows periosteal new bone formation. IMPRESSION: Moderate cardiomegaly. No active lung disease. Multiple left lateral rib fractures, at least 1 of which shows periosteal new bone formation.  Electronically Signed   By: Danae Orleans M.D.   On: 11/09/2022 11:16    PERFORMANCE STATUS (ECOG) : 3 - Symptomatic, >50% confined to bed  Review of Systems Unless otherwise noted, a complete review of systems is negative.  Physical Exam General: NAD Pulmonary: Unlabored Extremities: no edema, no joint deformities Skin: no rashes Neurological: Weakness, mild confusion  IMPRESSION: Follow-up visit.  Left breast biopsy positive for moderately differentiated adenocarcinoma of the breast.  Strongly ER/PR positive, HER2 negative.  Patient has been started on letrozole per family request to pursue treatment.    I again met with patient's sister, [name].  Also met with another sister, Wilnette Kales.  Discussed goals extensively with both sisters.  Both verbalized a strong desire to try treatment with an AI and pursue rehab.  They would like to hold on pursuing XRT at this time but are interested in possibly considering it in the future.  If patient further declines or fails to improve, they would be willing at that time to have a conversation regarding less aggressive measures and/or hospice.  However, family is not ready for that at this time.  Of note, prior to this hospitalization patient was living alone independently with good performance status per family. Patient is widowed with no children. Sisters are elderly and limited in their ability to care for her at home. They would like to try rehab to see if patient can improve. Unclear if this is possible.   Family do seem to have an understanding that patient's overall prognosis is poor.   Symptomatically, patient is currently comfortable appearing.  Mildly confused.  PLAN: -Continue current scope of treatment -Family requests rehab -I am happy to follow-up outpatient.  Would recommend palliative care following at SNF if that is her ultimate disposition  Discussed with TOC, Dr. Cathie Hoops   Time Total: 60 minutes  Visit consisted of counseling and  education dealing with the complex and emotionally intense issues of symptom management and palliative care in the setting of serious and potentially life-threatening illness.Greater than 50%  of this time was spent counseling and coordinating care related to the above assessment and plan.  Signed by: Laurette Schimke, PhD, NP-C

## 2022-11-20 NOTE — Progress Notes (Addendum)
Mobility Specialist - Progress Note   11/20/22 1122  Mobility  Activity Stood at bedside;Dangled on edge of bed;Transferred from bed to chair  Level of Assistance Moderate assist, patient does 50-74%  Assistive Device Other (Comment) (B HHA)  Distance Ambulated (ft) 4 ft  Range of Motion/Exercises Active Assistive;Right leg;Left leg  Activity Response Tolerated well  $Mobility charge 1 Mobility     Pt lying in bed upon arrival, utilizing RA. Pt pleasantly agreeable to activity. Participated in BLE therex with active-assist prior to OOB activity. Pt rolled L/R with modA for peri-care d/t soiled linen; linen changed and new gown provided. Multimodal cueing for hand placement to logroll towards EOB with maxA +2. Increased time for processing directions. Posterior lean in sitting and standing. STS and pivot transfer with modA +2. Pt able to take 4 small steps this date before reverting to slide-shuffle. Pt left in chair with alarm set, needs in reach. Family at bedside.    Filiberto Pinks Mobility Specialist 11/20/22, 11:58 AM

## 2022-11-20 NOTE — Progress Notes (Addendum)
[facility]- Civil engineer, contracting   Received a request from [facility], Jonetta Speak, RN, to speak with the patient's sister about Hospice services. Patient's sister, [name], asked to meet at 1pm tomorrow in the patient's room.    Please don't hesitate to call with any Hospice related questions or concerns.    Thank you for the opportunity to participate in this patient's care. [facility] Liaison [phone]

## 2022-11-20 NOTE — Progress Notes (Addendum)
PROGRESS NOTE    Cassandra Patterson  OZD:[id number] DOB: 1928/10/11 DOA: 11/09/2022 PCP: Leanna Sato, MD   Assessment & Plan:   Principal Problem:   Weakness Active Problems:   AKI (acute kidney injury) (HCC)   Lytic bone lesion of hip   Severe protein-calorie malnutrition (HCC)   Dry skin   Essential hypertension   First degree AV block   Mobitz type 1 second degree AV block   TIA (transient ischemic attack)   History of TIA (transient ischemic attack)   Metastasis to bone (HCC)   Normocytic anemia   Pressure injury of skin   Palliative care encounter   Malignant neoplasm of overlapping sites of left breast in female, estrogen receptor positive (HCC)  Assessment and Plan: Weakness: likely secondary to metastatic cancer. PT/OT recs SNF but pt's insurance denied SNF even after peer to peer. Discussed this w/ pt's family and they want to think over their options before making a final decision    Breast cancer: w/ extensive lytic bone lesions w/ metastatic lesions of lumbosacral spine, left ischium, bilateral iliac bones, ribs, pathologic fractures at the inferior left ischial tuberosity, T12, L5 vertebral bodies.  Large palpable breast mass as per onco. CT abd/pelvis shows 3.1 cm soft tissue mass left lateral body wall, 3.7 cm cystic lesion in the left breast. CT chest shows multiple lytic bone metastases in T spine, ribs, 3.1 cm soft tissue lesion in T9 vertebral body destroys right pedicle w/ tumor extension into the spinal cord with multiple other findings, see CT impression.   Pathology showed metastatic moderately differentiated adenocarcinoma consistent with breast primary. Continue on letrozole as per onco    AKI: resolved   Anemia of chronic disease: H&H are stable. No need for a transfusion currently    Severe protein-calorie malnutrition: continue on nutritional supplements    Hx of TIA: continue on aspirin    HTN: continue on home dose of felodipine    Pressure  injury left buttock: stage 3, present on admission. Continue w/ wound care   Partial thickness skin tear to left heel: w/ foul odor. Start po augmentin. Continue w/ daily wound care        DVT prophylaxis: lovenox  Code Status: DNR Family Communication: discussed pt's care w/ pt's family at bedside  Disposition Plan: PT/OT recs SNF but pt's insurance are refusing SNF despite peer to peer. Discussed w/ pt's family who want to think things over before making a final decision   Level of care: Telemetry Medical Status is: Inpatient Remains inpatient appropriate because: severity of illness     Consultants:  Onco Palliative care  Procedures:   Antimicrobials:    Subjective: Pt c/o malaise   Objective: Vitals:   11/19/22 0832 11/19/22 1610 11/19/22 2045 11/20/22 0426  BP: 131/61 (!) 165/89 138/77 (!) 150/69  Pulse: 75 (!) 103 72 (!) 53  Resp: 18 18 18 18   Temp: 98.8 F (37.1 C) 98.2 F (36.8 C) 98.2 F (36.8 C) 98.7 F (37.1 C)  TempSrc:    Oral  SpO2: 96% 93% 99% 97%  Weight:      Height:        Intake/Output Summary (Last 24 hours) at 11/20/2022 0807 Last data filed at 11/19/2022 1000 Gross per 24 hour  Intake 120 ml  Output --  Net 120 ml   Filed Weights   11/09/22 1053  Weight: 37.2 kg    Examination:  General exam: Appears comfortable. Cachetic appearing  Respiratory system:  clear breath sounds b/l  Cardiovascular system: S1/S2+. No rubs or clicks  Gastrointestinal system: Abd is soft, NT, ND & hypoactive bowel sounds  Central nervous system: alert & awake. Moves all extremities Psychiatry: Judgement and insight appears poor. Appropriate mood and affect    Data Reviewed: I have personally reviewed following labs and imaging studies  CBC: Recent Labs  Lab 11/14/22 0722 11/19/22 0454  WBC 6.1 5.5  HGB 8.6* 8.6*  HCT 29.2* 29.6*  MCV 79.1* 80.4  PLT 235 275   Basic Metabolic Panel: Recent Labs  Lab 11/14/22 0722 11/19/22 0454  NA 142  141  K 4.2 4.4  CL 113* 106  CO2 22 27  GLUCOSE 84 86  BUN 24* 26*  CREATININE 0.82 0.76  CALCIUM 8.4* 8.5*  MG 2.2 2.2   GFR: Estimated Creatinine Clearance: 26.4 mL/min (by C-G formula based on SCr of 0.76 mg/dL). Liver Function Tests: No results for input(s): "AST", "ALT", "ALKPHOS", "BILITOT", "PROT", "ALBUMIN" in the last 168 hours. No results for input(s): "LIPASE", "AMYLASE" in the last 168 hours. No results for input(s): "AMMONIA" in the last 168 hours. Coagulation Profile: No results for input(s): "INR", "PROTIME" in the last 168 hours. Cardiac Enzymes: No results for input(s): "CKTOTAL", "CKMB", "CKMBINDEX", "TROPONINI" in the last 168 hours. BNP (last 3 results) No results for input(s): "PROBNP" in the last 8760 hours. HbA1C: No results for input(s): "HGBA1C" in the last 72 hours. CBG: No results for input(s): "GLUCAP" in the last 168 hours. Lipid Profile: No results for input(s): "CHOL", "HDL", "LDLCALC", "TRIG", "CHOLHDL", "LDLDIRECT" in the last 72 hours. Thyroid Function Tests: No results for input(s): "TSH", "T4TOTAL", "FREET4", "T3FREE", "THYROIDAB" in the last 72 hours. Anemia Panel: No results for input(s): "VITAMINB12", "FOLATE", "FERRITIN", "TIBC", "IRON", "RETICCTPCT" in the last 72 hours.  Sepsis Labs: No results for input(s): "PROCALCITON", "LATICACIDVEN" in the last 168 hours.  No results found for this or any previous visit (from the past 240 hour(s)).       Radiology Studies: No results found.      Scheduled Meds:  vitamin C  500 mg Oral BID   aspirin EC  81 mg Oral Daily   brimonidine  1 drop Both Eyes BID   calcitonin (salmon)  1 spray Alternating Nares Daily   cholecalciferol  1,000 Units Oral Daily   enoxaparin (LOVENOX) injection  30 mg Subcutaneous QHS   feeding supplement  237 mL Oral TID BM   felodipine  5 mg Oral Daily   latanoprost  1 drop Left Eye QHS   Latanoprostene Bunod  1 drop Ophthalmic QHS   letrozole  2.5 mg  Oral Daily   multivitamin with minerals  1 tablet Oral Daily   sodium bicarbonate  650 mg Oral BID   zinc sulfate  220 mg Oral Daily   Continuous Infusions:  sodium chloride 10 mL/hr at 11/12/22 1251     LOS: 11 days        Charise Killian, MD [facility] Pager [phone]  If 7PM-7AM, please contact night-coverage [email or web address]  11/20/2022, 8:07 AM

## 2022-11-21 DIAGNOSIS — C50919 Malignant neoplasm of unspecified site of unspecified female breast: Secondary | ICD-10-CM | POA: Diagnosis not present

## 2022-11-21 DIAGNOSIS — Z170 Estrogen receptor positive status [ER+]: Secondary | ICD-10-CM | POA: Diagnosis not present

## 2022-11-21 LAB — CBC
HCT: 31.8 % — ABNORMAL LOW (ref 36.0–46.0)
Hemoglobin: 9.3 g/dL — ABNORMAL LOW (ref 12.0–15.0)
MCH: 23.2 pg — ABNORMAL LOW (ref 26.0–34.0)
MCHC: 29.2 g/dL — ABNORMAL LOW (ref 30.0–36.0)
MCV: 79.3 fL — ABNORMAL LOW (ref 80.0–100.0)
Platelets: 286 10*3/uL (ref 150–400)
RBC: 4.01 MIL/uL (ref 3.87–5.11)
RDW: 18.2 % — ABNORMAL HIGH (ref 11.5–15.5)
WBC: 5.4 10*3/uL (ref 4.0–10.5)
nRBC: 0.4 % — ABNORMAL HIGH (ref 0.0–0.2)

## 2022-11-21 LAB — BASIC METABOLIC PANEL
Anion gap: 8 (ref 5–15)
BUN: 25 mg/dL — ABNORMAL HIGH (ref 8–23)
CO2: 27 mmol/L (ref 22–32)
Calcium: 8.6 mg/dL — ABNORMAL LOW (ref 8.9–10.3)
Chloride: 105 mmol/L (ref 98–111)
Creatinine, Ser: 0.81 mg/dL (ref 0.44–1.00)
GFR, Estimated: >60 mL/min (ref >60)
Glucose, Bld: 86 mg/dL (ref 70–99)
Potassium: 4.9 mmol/L (ref 3.5–5.1)
Sodium: 140 mmol/L (ref 135–145)

## 2022-11-21 LAB — MAGNESIUM: Magnesium: 2.2 mg/dL (ref 1.7–2.4)

## 2022-11-21 NOTE — TOC Progression Note (Signed)
Transition of Care [facility]) - Progression Note    Patient Details  Name: Cassandra Patterson MRN: [id number] Date of Birth: 10/16/1928  Transition of Care [facility]) CM/SW Contact  Garret Reddish, RN Phone Number: 11/21/2022, 4:27 PM  Clinical Narrative:     I have spoken to Mrs. Cassandra Patterson patient's sister at bedside.  I have given her appeal notice.  I have given her the number to call for a fast track appeal with [employer].    TOC will follow for discharge planning.    Expected Discharge Plan: Skilled Nursing Facility Barriers to Discharge: Continued Medical Work up, SNF Pending bed offer, Insurance Authorization  Expected Discharge Plan and Services                                               Social Determinants of Health (SDOH) Interventions SDOH Screenings   Food Insecurity: Patient Unable To Answer (11/09/2022)  Housing: Patient Unable To Answer (11/09/2022)  Transportation Needs: Patient Unable To Answer (11/09/2022)  Utilities: Patient Unable To Answer (11/09/2022)  Tobacco Use: Low Risk  (11/09/2022)    Readmission Risk Interventions     No data to display

## 2022-11-21 NOTE — Progress Notes (Addendum)
[facility] Liaison Note   Received request from [facility], Jonetta Speak, RN, to meet with patient's family at bedside to explain hospice services at home, ALF, LTC and IPU.  HL met with patient's family and provided extensive education for hospice services.  Family requested additional time to consider options before proceeding. All questions answered and no concerns voiced.  [facility] will follow peripherally as  family continues to have ongoing GOC discussions.     [facility] information and contact numbers given to family & above information shared with TOC.   Please call with any questions/concerns.    Thank you for the opportunity to participate in this patient's care.  [facility] Liaison [phone]

## 2022-11-21 NOTE — Plan of Care (Signed)
  Problem: Clinical Measurements: Goal: Will remain free from infection Outcome: Progressing   Problem: Coping: Goal: Level of anxiety will decrease Outcome: Progressing   Problem: Elimination: Goal: Will not experience complications related to bowel motility Outcome: Progressing   Problem: Pain Managment: Goal: General experience of comfort will improve Outcome: Progressing   Problem: Safety: Goal: Ability to remain free from injury will improve Outcome: Progressing   

## 2022-11-21 NOTE — Progress Notes (Signed)
PROGRESS NOTE    Cassandra Patterson  WER:[id number] DOB: 1928-02-25 DOA: 11/09/2022 PCP: Leanna Sato, MD   Assessment & Plan:   Principal Problem:   Weakness Active Problems:   AKI (acute kidney injury) (HCC)   Lytic bone lesion of hip   Severe protein-calorie malnutrition (HCC)   Dry skin   Essential hypertension   First degree AV block   Mobitz type 1 second degree AV block   TIA (transient ischemic attack)   History of TIA (transient ischemic attack)   Metastasis to bone (HCC)   Normocytic anemia   Pressure injury of skin   Palliative care encounter   Malignant neoplasm of overlapping sites of left breast in female, estrogen receptor positive (HCC)  Assessment and Plan: Weakness: likely secondary to metastatic cancer. PT/OT recs SNF but pt's insurance denied SNF even after peer to peer. Discussed this w/ pt's family and they want to think over their options before making a final decision    Breast cancer: w/ extensive lytic bone lesions w/ metastatic lesions of lumbosacral spine, left ischium, bilateral iliac bones, ribs, pathologic fractures at the inferior left ischial tuberosity, T12, L5 vertebral bodies.  Large palpable breast mass as per onco. CT abd/pelvis shows 3.1 cm soft tissue mass left lateral body wall, 3.7 cm cystic lesion in the left breast. CT chest shows multiple lytic bone metastases in T spine, ribs, 3.1 cm soft tissue lesion in T9 vertebral body destroys right pedicle w/ tumor extension into the spinal cord with multiple other findings, see CT impression.   Pathology showed metastatic moderately differentiated adenocarcinoma consistent with breast primary. Continue on letrozole as per onco    AKI: resolved   Anemia of chronic disease:H&H are stable. No need for a transfusion currently    Severe protein-calorie malnutrition: continue on nutritional supplements    Hx of TIA: continue on aspirin   HTN: continue on home dose of felodipine   Pressure  injury left buttock: stage 3, present on admission. Continue w/ wound care   Partial thickness skin tear to left heel: w/ foul odor. Continue on augmentin. Continue w/ daily wound care         DVT prophylaxis: lovenox  Code Status: DNR Family Communication: discussed pt's care w/ pt's family at bedside  Disposition Plan: PT/OT recs SNF but pt's insurance are refusing SNF despite peer to peer. Discussed w/ pt's family who want to think things over before making a final decision. Family is meeting w/ hospice today   Level of care: Telemetry Medical Status is: Inpatient Remains inpatient appropriate because: family is meeting w/ hospice today     Consultants:  Onco Palliative care Hospice   Procedures:   Antimicrobials:    Subjective: Pt c/o fatigue   Objective: Vitals:   11/20/22 1633 11/20/22 2100 11/21/22 0511 11/21/22 0757  BP: 122/76 (!) 166/70 130/71 (!) 159/73  Pulse: 60 71 (!) 53 (!) 57  Resp: 18 18  18   Temp: 98.2 F (36.8 C) 97.7 F (36.5 C) 98.8 F (37.1 C) 98.2 F (36.8 C)  TempSrc: Oral Oral Oral   SpO2: 97% 97% 99% 98%  Weight:      Height:        Intake/Output Summary (Last 24 hours) at 11/21/2022 0839 Last data filed at 11/20/2022 0900 Gross per 24 hour  Intake 240 ml  Output --  Net 240 ml   Filed Weights   11/09/22 1053  Weight: 37.2 kg    Examination:  General exam: Appears sleepy. Cachetic appearing  Respiratory system: decreased breath sounds b/l   Cardiovascular system: S1 &S2+. No rubs or clicks  Gastrointestinal system: abd is soft, NT, obese & hypoactive bowel sounds  Central nervous system: moves all extremities  Psychiatry: Judgement and insight appears poor. Flat mood and affect     Data Reviewed: I have personally reviewed following labs and imaging studies  CBC: Recent Labs  Lab 11/19/22 0454 11/21/22 0456  WBC 5.5 5.4  HGB 8.6* 9.3*  HCT 29.6* 31.8*  MCV 80.4 79.3*  PLT 275 286   Basic Metabolic  Panel: Recent Labs  Lab 11/19/22 0454 11/21/22 0456  NA 141 140  K 4.4 4.9  CL 106 105  CO2 27 27  GLUCOSE 86 86  BUN 26* 25*  CREATININE 0.76 0.81  CALCIUM 8.5* 8.6*  MG 2.2 2.2   GFR: Estimated Creatinine Clearance: 26 mL/min (by C-G formula based on SCr of 0.81 mg/dL). Liver Function Tests: No results for input(s): "AST", "ALT", "ALKPHOS", "BILITOT", "PROT", "ALBUMIN" in the last 168 hours. No results for input(s): "LIPASE", "AMYLASE" in the last 168 hours. No results for input(s): "AMMONIA" in the last 168 hours. Coagulation Profile: No results for input(s): "INR", "PROTIME" in the last 168 hours. Cardiac Enzymes: No results for input(s): "CKTOTAL", "CKMB", "CKMBINDEX", "TROPONINI" in the last 168 hours. BNP (last 3 results) No results for input(s): "PROBNP" in the last 8760 hours. HbA1C: No results for input(s): "HGBA1C" in the last 72 hours. CBG: No results for input(s): "GLUCAP" in the last 168 hours. Lipid Profile: No results for input(s): "CHOL", "HDL", "LDLCALC", "TRIG", "CHOLHDL", "LDLDIRECT" in the last 72 hours. Thyroid Function Tests: No results for input(s): "TSH", "T4TOTAL", "FREET4", "T3FREE", "THYROIDAB" in the last 72 hours. Anemia Panel: No results for input(s): "VITAMINB12", "FOLATE", "FERRITIN", "TIBC", "IRON", "RETICCTPCT" in the last 72 hours.  Sepsis Labs: No results for input(s): "PROCALCITON", "LATICACIDVEN" in the last 168 hours.  No results found for this or any previous visit (from the past 240 hour(s)).       Radiology Studies: No results found.      Scheduled Meds:  amoxicillin-clavulanate  1 tablet Oral Q12H   vitamin C  500 mg Oral BID   aspirin EC  81 mg Oral Daily   brimonidine  1 drop Both Eyes BID   calcitonin (salmon)  1 spray Alternating Nares Daily   cholecalciferol  1,000 Units Oral Daily   enoxaparin (LOVENOX) injection  30 mg Subcutaneous QHS   feeding supplement  237 mL Oral TID BM   felodipine  5 mg Oral  Daily   latanoprost  1 drop Left Eye QHS   Latanoprostene Bunod  1 drop Ophthalmic QHS   letrozole  2.5 mg Oral Daily   multivitamin with minerals  1 tablet Oral Daily   sodium bicarbonate  650 mg Oral BID   zinc sulfate  220 mg Oral Daily   Continuous Infusions:  sodium chloride 10 mL/hr at 11/12/22 1251     LOS: 12 days        Charise Killian, MD [facility] Pager [phone]  If 7PM-7AM, please contact night-coverage [email or web address]  11/21/2022, 8:39 AM

## 2022-11-21 NOTE — Plan of Care (Signed)
  Problem: Clinical Measurements: Goal: Ability to maintain clinical measurements within normal limits will improve Outcome: Progressing   

## 2022-11-21 NOTE — TOC Progression Note (Signed)
Transition of Care [facility]) - Progression Note    Patient Details  Name: Cassandra Patterson MRN: [id number] Date of Birth: 17-Dec-1928  Transition of [facility]) CM/SW Contact  Garret Reddish, RN Phone Number: 11/21/2022, 4:13 PM  Clinical Narrative:     Received appeal letter from [facility] [employer].  I have called and left message for patient's sister [name].  I have informed [name] that I have left the Appeal Letter at the bedside.  I have also left the number to call for a fast track appeal on [name] voicemail.    [facility] was able to meet with patient at bedside today.  Patient currently does not meet inpatient Hospice criteria.  TOC will continue to follow for discharge planning.     Expected Discharge Plan: Skilled Nursing Facility Barriers to Discharge: Continued Medical Work up, SNF Pending bed offer, Insurance Authorization  Expected Discharge Plan and Services                                               Social Determinants of Health (SDOH) Interventions SDOH Screenings   Food Insecurity: Patient Unable To Answer (11/09/2022)  Housing: Patient Unable To Answer (11/09/2022)  Transportation Needs: Patient Unable To Answer (11/09/2022)  Utilities: Patient Unable To Answer (11/09/2022)  Tobacco Use: Low Risk  (11/09/2022)    Readmission Risk Interventions     No data to display

## 2022-11-21 NOTE — TOC Progression Note (Signed)
Transition of Care [facility]) - Progression Note    Patient Details  Name: Cassandra Patterson MRN: [id number] Date of Birth: [birth year]  Transition [facility] Care [facility]) CM/SW Contact  Garret Reddish, RN Phone Number: 11/21/2022, 10:56 AM  Clinical Narrative:    Chart reviewed.  I have spoken with [name] patient's sister and informed her that [employer] has denied patient for SNF.  I have informed [name] that she does have right to a fast appeal. I have made her aware that once I receive information from [employer] I would make her aware of the number to call the appeal.    I have explained in detail to [name] disposition options.  We has spoken about the SNF appeal, Home with Home Health, and Hospice Services.  [name] has requested to speak with someone from Hospice.  She would like to speak with a representative from [facility].  I have made Hospice referral to Lillia Mountain with [facility].    I have made Dr. Mayford Knife aware.    TOC will continue to follow for discharge planning.  Expected Discharge Plan: Skilled Nursing Facility Barriers to Discharge: Continued Medical Work up, SNF Pending bed offer, Insurance Authorization  Expected Discharge Plan and Services                                               Social Determinants of Health (SDOH) Interventions SDOH Screenings   Food Insecurity: Patient Unable To Answer (11/09/2022)  Housing: Patient Unable To Answer (11/09/2022)  Transportation Needs: Patient Unable To Answer (11/09/2022)  Utilities: Patient Unable To Answer (11/09/2022)  Tobacco Use: Low Risk  (11/09/2022)    Readmission Risk Interventions     No data to display

## 2022-11-21 NOTE — Progress Notes (Signed)
Mobility Specialist - Progress Note   11/21/22 1130  Mobility  Activity Ambulated with assistance in room;Transferred from bed to chair  Level of Assistance Minimal assist, patient does 75% or more  Assistive Device Front wheel walker  Distance Ambulated (ft) 16 ft  Range of Motion/Exercises Active Assistive;Right leg;Left leg  Activity Response Tolerated well  $Mobility charge 1 Mobility     Pt lying in bed upon arrival, utilizing RA. Pt agreeable to activity. Pt seems more alert today, with slightly quicker response time to instruction/questions. AO to self, location, and time (specifically month but not year). Participated in AA bed-level therex prior to OOB activity. Pt completed bed mobility with modA. No post lean noted in seated position this date; able to hold self in upright position at minG with & without use of bedrails. STS and ambulation 8' x 2 this date with minA +2. Forward flexed lean in standing with cues for corrective posture. Intermittent assist for RW negotiation. VC for sequencing. Pt left in chair with alarm set, needs in reach.    Filiberto Pinks Mobility Specialist 11/21/22, 11:46 AM

## 2022-11-22 DIAGNOSIS — Z170 Estrogen receptor positive status [ER+]: Secondary | ICD-10-CM | POA: Diagnosis not present

## 2022-11-22 DIAGNOSIS — C50919 Malignant neoplasm of unspecified site of unspecified female breast: Secondary | ICD-10-CM | POA: Diagnosis not present

## 2022-11-22 MED ORDER — ONDANSETRON 4 MG PO TBDP
4.0000 mg | ORAL_TABLET | Freq: Three times a day (TID) | ORAL | Status: DC | PRN
  Administered 2022-11-22: 4 mg via ORAL
  Filled 2022-11-22: qty 1

## 2022-11-22 NOTE — Progress Notes (Signed)
CSW contacted [employer] and spoke with Debbie. CSW was informed the appeal has been submitted and is under review.

## 2022-11-22 NOTE — Plan of Care (Signed)
  Problem: Health Behavior/Discharge Planning: Goal: Ability to manage health-related needs will improve Outcome: Progressing   Problem: Clinical Measurements: Goal: Ability to maintain clinical measurements within normal limits will improve Outcome: Progressing Goal: Will remain free from infection Outcome: Progressing   

## 2022-11-22 NOTE — Plan of Care (Signed)

## 2022-11-22 NOTE — Progress Notes (Signed)
PROGRESS NOTE    Cassandra Patterson  ZOX:[id number] DOB: Sep 07, 1928 DOA: 11/09/2022 PCP: Leanna Sato, MD   Assessment & Plan:   Principal Problem:   Weakness Active Problems:   AKI (acute kidney injury) (HCC)   Lytic bone lesion of hip   Severe protein-calorie malnutrition (HCC)   Dry skin   Essential hypertension   First degree AV block   Mobitz type 1 second degree AV block   TIA (transient ischemic attack)   History of TIA (transient ischemic attack)   Metastasis to bone (HCC)   Normocytic anemia   Pressure injury of skin   Palliative care encounter   Malignant neoplasm of overlapping sites of left breast in female, estrogen receptor positive (HCC)  Assessment and Plan: Weakness: likely secondary to metastatic cancer. PT/OT recs SNF but pt's insurance denied SNF even after peer to peer. Pt's sister is unable to care for pt by herself at home   Breast cancer: w/ extensive lytic bone lesions w/ metastatic lesions of lumbosacral spine, left ischium, bilateral iliac bones, ribs, pathologic fractures at the inferior left ischial tuberosity, T12, L5 vertebral bodies.  Large palpable breast mass as per onco. CT abd/pelvis shows 3.1 cm soft tissue mass left lateral body wall, 3.7 cm cystic lesion in the left breast. CT chest shows multiple lytic bone metastases in T spine, ribs, 3.1 cm soft tissue lesion in T9 vertebral body destroys right pedicle w/ tumor extension into the spinal cord with multiple other findings, see CT impression.   Pathology showed metastatic moderately differentiated adenocarcinoma consistent with breast primary. Continue on letrozole as per onco    AKI: resolved   Anemia of chronic disease: H&H are stable. No need for a transfusion currently    Severe protein-calorie malnutrition: continue on nutritional supplements    Hx of TIA: continue on aspirin   HTN: continue on home dose of felodipine   Pressure injury left buttock: stage 3, present on admission.  Continue w/ wound care    Partial thickness skin tear to left heel: w/ foul odor. Continue w/ daily wound care. Continue w/ augmentin         DVT prophylaxis: lovenox  Code Status: DNR Family Communication: discussed pt's care w/ pt's family at bedside  Disposition Plan: PT/OT recs SNF but pt's insurance are refusing SNF despite peer to peer. Pt's family appealed insurance denial    Level of care: Telemetry Medical Status is: Inpatient Remains inpatient appropriate because: Poor prognosis. Pt's family appealed pt's insurance denial    Consultants:  Onco Palliative care Hospice   Procedures:   Antimicrobials:    Subjective: Pt c/o malaise   Objective: Vitals:   11/21/22 0914 11/21/22 1608 11/21/22 2015 11/22/22 0427  BP: 129/68 (!) 148/69 (!) 144/66 (!) 140/64  Pulse: (!) 57 63 (!) 54 63  Resp: 17 16 15 16   Temp:  97.7 F (36.5 C) 98.2 F (36.8 C) 98.2 F (36.8 C)  TempSrc:      SpO2:  98% 100% 98%  Weight:      Height:        Intake/Output Summary (Last 24 hours) at 11/22/2022 0805 Last data filed at 11/21/2022 2018 Gross per 24 hour  Intake --  Output 250 ml  Net -250 ml   Filed Weights   11/09/22 1053  Weight: 37.2 kg    Examination:  General exam: Appears comfortable. Cachetic appearing  Respiratory system: diminished breath sounds b/l  Cardiovascular system: S1/S2+. No rubs or clicks  Gastrointestinal system: abd is soft, NT, ND & hypoactive bowel sounds Central nervous system: moves all extremities  Psychiatry: judgement and insight appears poor. Flat mood and affect    Data Reviewed: I have personally reviewed following labs and imaging studies  CBC: Recent Labs  Lab 11/19/22 0454 11/21/22 0456  WBC 5.5 5.4  HGB 8.6* 9.3*  HCT 29.6* 31.8*  MCV 80.4 79.3*  PLT 275 286   Basic Metabolic Panel: Recent Labs  Lab 11/19/22 0454 11/21/22 0456  NA 141 140  K 4.4 4.9  CL 106 105  CO2 27 27  GLUCOSE 86 86  BUN 26* 25*   CREATININE 0.76 0.81  CALCIUM 8.5* 8.6*  MG 2.2 2.2   GFR: Estimated Creatinine Clearance: 26 mL/min (by C-G formula based on SCr of 0.81 mg/dL). Liver Function Tests: No results for input(s): "AST", "ALT", "ALKPHOS", "BILITOT", "PROT", "ALBUMIN" in the last 168 hours. No results for input(s): "LIPASE", "AMYLASE" in the last 168 hours. No results for input(s): "AMMONIA" in the last 168 hours. Coagulation Profile: No results for input(s): "INR", "PROTIME" in the last 168 hours. Cardiac Enzymes: No results for input(s): "CKTOTAL", "CKMB", "CKMBINDEX", "TROPONINI" in the last 168 hours. BNP (last 3 results) No results for input(s): "PROBNP" in the last 8760 hours. HbA1C: No results for input(s): "HGBA1C" in the last 72 hours. CBG: No results for input(s): "GLUCAP" in the last 168 hours. Lipid Profile: No results for input(s): "CHOL", "HDL", "LDLCALC", "TRIG", "CHOLHDL", "LDLDIRECT" in the last 72 hours. Thyroid Function Tests: No results for input(s): "TSH", "T4TOTAL", "FREET4", "T3FREE", "THYROIDAB" in the last 72 hours. Anemia Panel: No results for input(s): "VITAMINB12", "FOLATE", "FERRITIN", "TIBC", "IRON", "RETICCTPCT" in the last 72 hours.  Sepsis Labs: No results for input(s): "PROCALCITON", "LATICACIDVEN" in the last 168 hours.  No results found for this or any previous visit (from the past 240 hour(s)).       Radiology Studies: No results found.      Scheduled Meds:  amoxicillin-clavulanate  1 tablet Oral Q12H   vitamin C  500 mg Oral BID   aspirin EC  81 mg Oral Daily   brimonidine  1 drop Both Eyes BID   calcitonin (salmon)  1 spray Alternating Nares Daily   cholecalciferol  1,000 Units Oral Daily   enoxaparin (LOVENOX) injection  30 mg Subcutaneous QHS   feeding supplement  237 mL Oral TID BM   felodipine  5 mg Oral Daily   latanoprost  1 drop Left Eye QHS   Latanoprostene Bunod  1 drop Ophthalmic QHS   letrozole  2.5 mg Oral Daily   multivitamin  with minerals  1 tablet Oral Daily   sodium bicarbonate  650 mg Oral BID   zinc sulfate  220 mg Oral Daily   Continuous Infusions:  sodium chloride 10 mL/hr at 11/12/22 1251     LOS: 13 days        Charise Killian, MD [facility] Pager [phone]  If 7PM-7AM, please contact night-coverage [email or web address]  11/22/2022, 8:05 AM

## 2022-11-23 DIAGNOSIS — C50919 Malignant neoplasm of unspecified site of unspecified female breast: Secondary | ICD-10-CM | POA: Diagnosis not present

## 2022-11-23 DIAGNOSIS — Z170 Estrogen receptor positive status [ER+]: Secondary | ICD-10-CM | POA: Diagnosis not present

## 2022-11-23 NOTE — Plan of Care (Signed)
  Problem: Education: Goal: Knowledge of General Education information will improve Description: Including pain rating scale, medication(s)/side effects and non-pharmacologic comfort measures 11/23/2022 1544 by Leandro Reasoner, RN Outcome: Progressing 11/23/2022 1544 by Leandro Reasoner, RN Outcome: Progressing   Problem: Health Behavior/Discharge Planning: Goal: Ability to manage health-related needs will improve 11/23/2022 1544 by Leandro Reasoner, RN Outcome: Progressing 11/23/2022 1544 by Leandro Reasoner, RN Outcome: Progressing   Problem: Clinical Measurements: Goal: Ability to maintain clinical measurements within normal limits will improve 11/23/2022 1544 by Leandro Reasoner, RN Outcome: Progressing 11/23/2022 1544 by Leandro Reasoner, RN Outcome: Progressing Goal: Will remain free from infection 11/23/2022 1544 by Leandro Reasoner, RN Outcome: Progressing 11/23/2022 1544 by Leandro Reasoner, RN Outcome: Progressing Goal: Diagnostic test results will improve 11/23/2022 1544 by Leandro Reasoner, RN Outcome: Progressing 11/23/2022 1544 by Leandro Reasoner, RN Outcome: Progressing Goal: Cardiovascular complication will be avoided 11/23/2022 1544 by Leandro Reasoner, RN Outcome: Progressing 11/23/2022 1544 by Leandro Reasoner, RN Outcome: Progressing   Problem: Activity: Goal: Risk for activity intolerance will decrease 11/23/2022 1544 by Leandro Reasoner, RN Outcome: Progressing 11/23/2022 1544 by Leandro Reasoner, RN Outcome: Progressing   Problem: Nutrition: Goal: Adequate nutrition will be maintained 11/23/2022 1544 by Leandro Reasoner, RN Outcome: Progressing 11/23/2022 1544 by Leandro Reasoner, RN Outcome: Progressing   Problem: Coping: Goal: Level of anxiety will decrease Outcome: Progressing   Problem: Elimination: Goal: Will not experience complications related to bowel motility Outcome: Progressing Goal: Will not experience complications related to urinary retention Outcome:  Progressing   Problem: Pain Managment: Goal: General experience of comfort will improve Outcome: Progressing   Problem: Safety: Goal: Ability to remain free from injury will improve Outcome: Progressing   Problem: Skin Integrity: Goal: Risk for impaired skin integrity will decrease Outcome: Progressing

## 2022-11-23 NOTE — Progress Notes (Signed)
PROGRESS NOTE    Cassandra Patterson  ONG:[id number] DOB: [birth year] DOA: 11/09/2022 PCP: Leanna Sato, MD   Assessment & Plan:   Principal Problem:   Weakness Active Problems:   AKI (acute kidney injury) (HCC)   Lytic bone lesion of hip   Severe protein-calorie malnutrition (HCC)   Dry skin   Essential hypertension   First degree AV block   Mobitz type 1 second degree AV block   TIA (transient ischemic attack)   History of TIA (transient ischemic attack)   Metastasis to bone (HCC)   Normocytic anemia   Pressure injury of skin   Palliative care encounter   Malignant neoplasm of overlapping sites of left breast in female, estrogen receptor positive (HCC)  Assessment and Plan: Weakness: likely secondary to metastatic cancer. PT/OT recs SNF but pt's insurance denied SNF even after peer to peer. Pt's sister is unable to care for pt by herself at home   Breast cancer: w/ extensive lytic bone lesions w/ metastatic lesions of lumbosacral spine, left ischium, bilateral iliac bones, ribs, pathologic fractures at the inferior left ischial tuberosity, T12, L5 vertebral bodies.  Large palpable breast mass as per onco. CT abd/pelvis shows 3.1 cm soft tissue mass left lateral body wall, 3.7 cm cystic lesion in the left breast. CT chest shows multiple lytic bone metastases in T spine, ribs, 3.1 cm soft tissue lesion in T9 vertebral body destroys right pedicle w/ tumor extension into the spinal cord with multiple other findings, see CT impression.   Pathology showed metastatic moderately differentiated adenocarcinoma consistent with breast primary. Continue on letrozole as per onco    AKI: resolved   Anemia of chronic disease: H&H are stable. No need for a transfusion currently    Severe protein-calorie malnutrition: continue on nutritional supplements     Hx of TIA: continue on aspirin   HTN: continue on home dose of felodipine   Pressure injury left buttock: stage 3, present on  admission. Continue w/ wound care   Partial thickness skin tear to left heel: w/ foul odor. Continue w/ daily would care. Continue w/ augmentin         DVT prophylaxis: lovenox  Code Status: DNR Family Communication: discussed pt's care w/ pt's family at bedside  Disposition Plan: PT/OT recs SNF but pt's insurance are refusing SNF despite peer to peer. Pt's family appealed insurance denial    Level of care: Telemetry Medical Status is: Inpatient Remains inpatient appropriate because: Poor prognosis. Pt's family appealed pt's insurance denial    Consultants:  Onco Palliative care Hospice   Procedures:   Antimicrobials:    Subjective: Pt c/o fatigue   Objective: Vitals:   11/22/22 0427 11/22/22 0816 11/22/22 1633 11/22/22 2103  BP: (!) 140/64 (!) 162/66 (!) 161/72 135/74  Pulse: 63 (!) 58 63 61  Resp: 16 16  16   Temp: 98.2 F (36.8 C) 98.4 F (36.9 C) 99 F (37.2 C) 97.8 F (36.6 C)  TempSrc:    Oral  SpO2: 98% 100% 100% 97%  Weight:      Height:        Intake/Output Summary (Last 24 hours) at 11/23/2022 0807 Last data filed at 11/23/2022 0602 Gross per 24 hour  Intake 240 ml  Output 1101 ml  Net -861 ml   Filed Weights   11/09/22 1053  Weight: 37.2 kg    Examination:  General exam: Appears calm & comfortable. Cachetic  Respiratory system: decreased breath sounds b/l  Cardiovascular system: S1 &  S2+. No rubs or clicks  Gastrointestinal system: abd is soft, NT, ND & hypoactive bowel sounds Central nervous system: moves all extremities  Psychiatry: judgement and insight appears poor. Flat mood and affect     Data Reviewed: I have personally reviewed following labs and imaging studies  CBC: Recent Labs  Lab 11/19/22 0454 11/21/22 0456  WBC 5.5 5.4  HGB 8.6* 9.3*  HCT 29.6* 31.8*  MCV 80.4 79.3*  PLT 275 286   Basic Metabolic Panel: Recent Labs  Lab 11/19/22 0454 11/21/22 0456  NA 141 140  K 4.4 4.9  CL 106 105  CO2 27 27  GLUCOSE  86 86  BUN 26* 25*  CREATININE 0.76 0.81  CALCIUM 8.5* 8.6*  MG 2.2 2.2   GFR: Estimated Creatinine Clearance: 26 mL/min (by C-G formula based on SCr of 0.81 mg/dL). Liver Function Tests: No results for input(s): "AST", "ALT", "ALKPHOS", "BILITOT", "PROT", "ALBUMIN" in the last 168 hours. No results for input(s): "LIPASE", "AMYLASE" in the last 168 hours. No results for input(s): "AMMONIA" in the last 168 hours. Coagulation Profile: No results for input(s): "INR", "PROTIME" in the last 168 hours. Cardiac Enzymes: No results for input(s): "CKTOTAL", "CKMB", "CKMBINDEX", "TROPONINI" in the last 168 hours. BNP (last 3 results) No results for input(s): "PROBNP" in the last 8760 hours. HbA1C: No results for input(s): "HGBA1C" in the last 72 hours. CBG: No results for input(s): "GLUCAP" in the last 168 hours. Lipid Profile: No results for input(s): "CHOL", "HDL", "LDLCALC", "TRIG", "CHOLHDL", "LDLDIRECT" in the last 72 hours. Thyroid Function Tests: No results for input(s): "TSH", "T4TOTAL", "FREET4", "T3FREE", "THYROIDAB" in the last 72 hours. Anemia Panel: No results for input(s): "VITAMINB12", "FOLATE", "FERRITIN", "TIBC", "IRON", "RETICCTPCT" in the last 72 hours.  Sepsis Labs: No results for input(s): "PROCALCITON", "LATICACIDVEN" in the last 168 hours.  No results found for this or any previous visit (from the past 240 hour(s)).       Radiology Studies: No results found.      Scheduled Meds:  amoxicillin-clavulanate  1 tablet Oral Q12H   vitamin C  500 mg Oral BID   aspirin EC  81 mg Oral Daily   brimonidine  1 drop Both Eyes BID   calcitonin (salmon)  1 spray Alternating Nares Daily   cholecalciferol  1,000 Units Oral Daily   enoxaparin (LOVENOX) injection  30 mg Subcutaneous QHS   feeding supplement  237 mL Oral TID BM   felodipine  5 mg Oral Daily   latanoprost  1 drop Left Eye QHS   Latanoprostene Bunod  1 drop Ophthalmic QHS   letrozole  2.5 mg Oral Daily    multivitamin with minerals  1 tablet Oral Daily   sodium bicarbonate  650 mg Oral BID   zinc sulfate  220 mg Oral Daily   Continuous Infusions:  sodium chloride 10 mL/hr at 11/12/22 1251     LOS: 14 days        Charise Killian, MD [facility] Pager [phone]  If 7PM-7AM, please contact night-coverage [email or web address]  11/23/2022, 8:07 AM

## 2022-11-23 NOTE — Progress Notes (Signed)
Physical Therapy Treatment Patient Details Name: Cassandra Patterson MRN: [id number] DOB: 1928/12/31 Today's Date: 11/23/2022   History of Present Illness Pt is a 89 yo female that presented to ED for weakness. PMH of TIA, HTN, mobitz. Imaging showed multiple L rib fractures and further workup showed osseous metastatic disease.    PT Comments  The patient was alert, oriented to self and family in room. Intermittent repetition of simple one step commands needed but pt pleasant and cooperative throughout. Supine to sit with minA from family and PT. Progressed to sitting EOB with CGA-supervision with BUE support. Sit <> stand with RW and modAx2 attempted twice but pt unable to advance BLE to take steps to the recliner due to posterior lean. Squat pivot with maxAx2 to recliner. Pt set up with lunch and needs in reach at end of session. The patient would benefit from further skilled PT intervention to progress towards goals.   If plan is discharge home, recommend the following: Two people to help with walking and/or transfers;Two people to help with bathing/dressing/bathroom;Direct supervision/assist for medications management;Help with stairs or ramp for entrance;Assist for transportation;Assistance with feeding;Assistance with cooking/housework;Supervision due to cognitive status   Can travel by private vehicle     No  Equipment Recommendations  Rolling walker (2 wheels)    Recommendations for Other Services       Precautions / Restrictions Precautions Precautions: Fall Precaution Comments: multiple sites of metatasic lesions Restrictions Weight Bearing Restrictions: No     Mobility  Bed Mobility Overal bed mobility: Needs Assistance Bed Mobility: Supine to Sit     Supine to sit: Min assist     General bed mobility comments: handheld assist provided by sister, minA to assist with BLE movement as well, weight shift    Transfers Overall transfer level: Needs assistance Equipment  used: 2 person hand held assist, Rolling walker (2 wheels) Transfers: Sit to/from Stand, Bed to chair/wheelchair/BSC Sit to Stand: Mod assist, +2 physical assistance     Squat pivot transfers: Max assist, +2 safety/equipment     General transfer comment: modA with RW but unable to take any steps due to continued posterior lean. squat pivot with two person handheld assist (sister in room assisting, assists at home)    Ambulation/Gait               General Gait Details: unable at this time   Stairs             Wheelchair Mobility     Tilt Bed    Modified Rankin (Stroke Patients Only)       Balance Overall balance assessment: Needs assistance Sitting-balance support: Feet supported, Single extremity supported Sitting balance-Leahy Scale: Poor Sitting balance - Comments: initially requires support in sitting but is able to progress to supervision with BUE support.   Standing balance support: Bilateral upper extremity supported, During functional activity, Reliant on assistive device for balance Standing balance-Leahy Scale: Poor                              Cognition Arousal: Alert Behavior During Therapy: WFL for tasks assessed/performed                                   General Comments: oriented to name and famliy in the room, sister reports cognition is not at baseline on this date  Exercises      General Comments        Pertinent Vitals/Pain Pain Assessment Pain Assessment: Faces Faces Pain Scale: Hurts a little bit Pain Location: grimaces with standing attempts Pain Intervention(s): Limited activity within patient's tolerance, Monitored during session, Repositioned    Home Living                          Prior Function            PT Goals (current goals can now be found in the care plan section) Progress towards PT goals: Progressing toward goals    Frequency    Min 1X/week      PT  Plan      Co-evaluation              AM-PAC PT "6 Clicks" Mobility   Outcome Measure  Help needed turning from your back to your side while in a flat bed without using bedrails?: A Lot Help needed moving from lying on your back to sitting on the side of a flat bed without using bedrails?: A Lot Help needed moving to and from a bed to a chair (including a wheelchair)?: A Lot Help needed standing up from a chair using your arms (e.g., wheelchair or bedside chair)?: A Lot Help needed to walk in hospital room?: Total Help needed climbing 3-5 steps with a railing? : Total 6 Click Score: 10    End of Session Equipment Utilized During Treatment: Gait belt Activity Tolerance: Patient tolerated treatment well Patient left: in chair;with call bell/phone within reach;with chair alarm set;with family/visitor present Nurse Communication: Mobility status PT Visit Diagnosis: Other abnormalities of gait and mobility (R26.89);Muscle weakness (generalized) (M62.81);Unsteadiness on feet (R26.81)     Time: 1610-9604 PT Time Calculation (min) (ACUTE ONLY): 18 min  Charges:    $Therapeutic Activity: 8-22 mins PT General Charges $$ ACUTE PT VISIT: 1 Visit                     Olga Coaster PT, DPT 4:38 PM,11/23/22

## 2022-11-24 DIAGNOSIS — Z170 Estrogen receptor positive status [ER+]: Secondary | ICD-10-CM | POA: Diagnosis not present

## 2022-11-24 DIAGNOSIS — C50919 Malignant neoplasm of unspecified site of unspecified female breast: Secondary | ICD-10-CM | POA: Diagnosis not present

## 2022-11-24 LAB — CBC
HCT: 30.6 % — ABNORMAL LOW (ref 36.0–46.0)
Hemoglobin: 8.9 g/dL — ABNORMAL LOW (ref 12.0–15.0)
MCH: 23 pg — ABNORMAL LOW (ref 26.0–34.0)
MCHC: 29.1 g/dL — ABNORMAL LOW (ref 30.0–36.0)
MCV: 79.1 fL — ABNORMAL LOW (ref 80.0–100.0)
Platelets: 267 10*3/uL (ref 150–400)
RBC: 3.87 MIL/uL (ref 3.87–5.11)
RDW: 17.6 % — ABNORMAL HIGH (ref 11.5–15.5)
WBC: 5 10*3/uL (ref 4.0–10.5)
nRBC: 0 % (ref 0.0–0.2)

## 2022-11-24 LAB — BASIC METABOLIC PANEL
Anion gap: 7 (ref 5–15)
BUN: 24 mg/dL — ABNORMAL HIGH (ref 8–23)
CO2: 28 mmol/L (ref 22–32)
Calcium: 8.9 mg/dL (ref 8.9–10.3)
Chloride: 102 mmol/L (ref 98–111)
Creatinine, Ser: 0.85 mg/dL (ref 0.44–1.00)
GFR, Estimated: >60 mL/min (ref >60)
Glucose, Bld: 86 mg/dL (ref 70–99)
Potassium: 4.4 mmol/L (ref 3.5–5.1)
Sodium: 137 mmol/L (ref 135–145)

## 2022-11-24 LAB — MAGNESIUM: Magnesium: 2.3 mg/dL (ref 1.7–2.4)

## 2022-11-24 MED ORDER — ORAL CARE MOUTH RINSE: 15.0000 mL | OROMUCOSAL | Status: DC | PRN

## 2022-11-24 NOTE — Plan of Care (Signed)

## 2022-11-24 NOTE — TOC Progression Note (Signed)
Transition of Care [facility]) - Progression Note    Patient Details  Name: Cassandra Patterson MRN: [id number] Date of Birth: [birth year]  [facility] CM/SW Contact  Garret Reddish, RN Phone Number: 11/24/2022, 4:09 PM  Clinical Narrative:    Call received from [facility] with [employer].  He informs me that the Fast appeal has been uphelded.  [name] reports that he has mailed and emailed an acknowledgement letter confirming the determination. [name] has started the 2nd level of appeal for which is the IRE review.  The 2nd level appeal can take up to 72 hours.  I have made [name] aware.  I have also made Dr. Mayford Knife aware.    TOC will continue to follow for discharge planning.     Expected Discharge Plan: Skilled Nursing Facility Barriers to Discharge: Continued Medical Work up, SNF Pending bed offer, Insurance Authorization  Expected Discharge Plan and Services                                               Social Determinants of Health (SDOH) Interventions SDOH Screenings   Food Insecurity: Patient Unable To Answer (11/09/2022)  Housing: Patient Unable To Answer (11/09/2022)  Transportation Needs: Patient Unable To Answer (11/09/2022)  Utilities: Patient Unable To Answer (11/09/2022)  Tobacco Use: Low Risk  (11/09/2022)    Readmission Risk Interventions     No data to display

## 2022-11-24 NOTE — Progress Notes (Signed)
Mobility Specialist - Progress Note   11/24/22 1000  Mobility  Activity Transferred from bed to chair  Level of Assistance Moderate assist, patient does 50-74%  Assistive Device Other (Comment) (B HHA)  Distance Ambulated (ft) 2 ft  Range of Motion/Exercises Active Assistive;Right leg;Left leg  Activity Response Tolerated well  $Mobility charge 1 Mobility     Pt lying in bed upon arrival, utilizing RA. Pt pleasant and agreeable to activity. Voiced pain at lesion site on tailbone. Participated in bed-level therex prior to OOB. Completed bed mobility with maxA +2. STS and pivot with modA +2. Post lean in standing. VC for sequencing steps with extra time for processing. Pt left in chair with alarm set, needs in reach.    Cassandra Patterson Mobility Specialist 11/24/22, 10:33 AM

## 2022-11-24 NOTE — Progress Notes (Signed)
Occupational Therapy Treatment Patient Details Name: Cassandra Patterson MRN: [id number] DOB: 1928-07-11 Today's Date: 11/24/2022   History of present illness Pt is a 89 yo female that presented to ED for weakness. PMH of TIA, HTN, mobitz. Imaging showed multiple L rib fractures and further workup showed osseous metastatic disease.   OT comments  Pt is supine in bed on arrival. Easily arousable and agreeable to OT session with encouragement. No pain verbally expressed, but pt grimacing with standing trials. Pt performed supine to sit at EOB with Min A and increased verb/tactile cues for hand placement on bed rails. She sat at EOB with CGA/SBA, but unable to scoot to EOB needing Mod A. Pt required Max A for STS from EOB to RW for ~10 seconds with posterior lean/push back d/t fear of falling. She returned to seated at EOB with reports of "being woozy." Required Max A to return to supine and increased time spent repositioning pt in bed with Max A to scoot to [facility] and Min/Mod A for rolling to bil sides to reposition chuck pad and pillow to offload pressure from her buttocks.  Pt left with all needs in place and will cont to require skilled acute OT services to maximize his safety and IND to return to PLOF.       If plan is discharge home, recommend the following:  Assistance with cooking/housework;Assist for transportation;Help with stairs or ramp for entrance;Supervision due to cognitive status;Two people to help with walking and/or transfers;Two people to help with bathing/dressing/bathroom   Equipment Recommendations  BSC/3in1    Recommendations for Other Services      Precautions / Restrictions Precautions Precaution Comments: multiple sites of metatasic lesions Restrictions Weight Bearing Restrictions: No       Mobility Bed Mobility Overal bed mobility: Needs Assistance Bed Mobility: Supine to Sit     Supine to sit: Min assist Sit to supine: Max assist         Transfers Overall transfer level: Needs assistance Equipment used: Rolling walker (2 wheels) Transfers: Sit to/from Stand Sit to Stand: Max assist           General transfer comment: Max A for STS at EOB to RW with posterior lean and pt fearful of falling     Balance Overall balance assessment: Needs assistance Sitting-balance support: Single extremity supported, Bilateral upper extremity supported Sitting balance-Leahy Scale: Fair                                     ADL either performed or assessed with clinical judgement   ADL Overall ADL's : Needs assistance/impaired                                       General ADL Comments: Pt reqiured increased motivation to participate, declined oral hygiene or face washing. Pt needed Min A for supine to sit at EOB and MOD A to scoot to EOB with ability to maitain seated balance at EOB with CGA/SBA. Performed STS from EOB with it slightly elevated to RW requiring Max A with posterior lean and pt fearful of falling.    Extremity/Trunk Assessment Upper Extremity Assessment Upper Extremity Assessment: Generalized weakness   Lower Extremity Assessment Lower Extremity Assessment: Generalized weakness   Cervical / Trunk Assessment Cervical / Trunk Assessment: Kyphotic    Vision  Perception     Praxis      Cognition Arousal: Alert Behavior During Therapy: WFL for tasks assessed/performed                                   General Comments: oriented to name and famliy in the room, sister reports cognition is not at baseline on this date        Exercises      Shoulder Instructions       General Comments pt with prevlon bootie on L foot for offloading    Pertinent Vitals/ Pain       Pain Assessment Pain Assessment: Faces Faces Pain Scale: Hurts a little bit Breathing: normal Negative Vocalization: none Facial Expression: smiling or inexpressive Body Language:  relaxed Consolability: no need to console PAINAD Score: 0 Pain Location: grimaces with standing attempts Pain Descriptors / Indicators: Grimacing Pain Intervention(s): Monitored during session, Limited activity within patient's tolerance  Home Living                                          Prior Functioning/Environment              Frequency  Min 1X/week        Progress Toward Goals  OT Goals(current goals can now be found in the care plan section)  Progress towards OT goals: Progressing toward goals  Acute Rehab OT Goals Patient Stated Goal: to feel better OT Goal Formulation: With patient Time For Goal Achievement: 12/01/22 Potential to Achieve Goals: Good  Plan      Co-evaluation                 AM-PAC OT "6 Clicks" Daily Activity     Outcome Measure   Help from another person eating meals?: A Little Help from another person taking care of personal grooming?: A Little Help from another person toileting, which includes using toliet, bedpan, or urinal?: A Lot Help from another person bathing (including washing, rinsing, drying)?: A Lot Help from another person to put on and taking off regular upper body clothing?: A Little Help from another person to put on and taking off regular lower body clothing?: A Lot 6 Click Score: 15    End of Session Equipment Utilized During Treatment: Gait belt;Rolling walker (2 wheels)  OT Visit Diagnosis: Other abnormalities of gait and mobility (R26.89);Muscle weakness (generalized) (M62.81);Unsteadiness on feet (R26.81)   Activity Tolerance Patient tolerated treatment well   Patient Left with bed alarm set   Nurse Communication Mobility status        Time: 8657-8469 OT Time Calculation (min): 26 min  Charges: OT General Charges $OT Visit: 1 Visit OT Treatments $Therapeutic Activity: 23-37 mins  Mayley Lish, OTR/L  11/24/22, 4:50 PM  Rubee Vega E Helayna Dun 11/24/2022, 4:47 PM

## 2022-11-24 NOTE — Plan of Care (Signed)
  Problem: Clinical Measurements: Goal: Ability to maintain clinical measurements within normal limits will improve Outcome: Progressing Goal: Will remain free from infection Outcome: Progressing   

## 2022-11-24 NOTE — Care Management Important Message (Signed)
Important Message  Patient Details  Name: Cassandra Patterson MRN: [id number] Date of Birth: [birth year]   Important Message Given:  Yes - Medicare IM     Jylian Pappalardo, Stephan Minister 11/24/2022, 3:22 PM

## 2022-11-24 NOTE — Progress Notes (Addendum)
PROGRESS NOTE    Cassandra Patterson  ZOX:[id number] DOB: May 30, 1928 DOA: 11/09/2022 PCP: Leanna Sato, MD   Assessment & Plan:   Principal Problem:   Weakness Active Problems:   AKI (acute kidney injury) (HCC)   Lytic bone lesion of hip   Severe protein-calorie malnutrition (HCC)   Dry skin   Essential hypertension   First degree AV block   Mobitz type 1 second degree AV block   TIA (transient ischemic attack)   History of TIA (transient ischemic attack)   Metastasis to bone (HCC)   Normocytic anemia   Pressure injury of skin   Palliative care encounter   Malignant neoplasm of overlapping sites of left breast in female, estrogen receptor positive (HCC)  Assessment and Plan: Weakness: likely secondary to metastatic cancer. PT/OT recs SNF but pt's insurance denied SNF even after peer to peer. Pt's sister is unable to care for pt by herself at home   Breast cancer: w/ extensive lytic bone lesions w/ metastatic lesions of lumbosacral spine, left ischium, bilateral iliac bones, ribs, pathologic fractures at the inferior left ischial tuberosity, T12, L5 vertebral bodies.  Large palpable breast mass as per onco. CT abd/pelvis shows 3.1 cm soft tissue mass left lateral body wall, 3.7 cm cystic lesion in the left breast. CT chest shows multiple lytic bone metastases in T spine, ribs, 3.1 cm soft tissue lesion in T9 vertebral body destroys right pedicle w/ tumor extension into the spinal cord with multiple other findings, see CT impression.   Pathology showed metastatic moderately differentiated adenocarcinoma consistent with breast primary. Continue on letrozole as per onco   Poor cognitive function: continue w/ supportive care    AKI: resolved   Anemia of chronic disease: H&H are labile. Will transfuse if Hb < 7.0    Severe protein-calorie malnutrition: continue on nutritional supplements    Hx of TIA: continue on aspirin   HTN: continue on home dose of felodipine   Pressure  injury left buttock: stage 3, present on admission. Continue w/ wound care   Partial thickness skin tear to left heel: w/ foul odor. Continue w/ daily wound care. Continue w/ augmentin       DVT prophylaxis: lovenox  Code Status: DNR Family Communication: discussed pt's care w/ pt's family at bedside  Disposition Plan: PT/OT recs SNF but pt's insurance are refusing SNF despite peer to peer. Pt's family appealed insurance denial    Level of care: Telemetry Medical Status is: Inpatient Remains inpatient appropriate because: Poor prognosis. Pt's family appealed pt's insurance denial    Consultants:  Onco Palliative care Hospice   Procedures:   Antimicrobials:    Subjective: Pt c/o malaise   Objective: Vitals:   11/23/22 2027 11/24/22 0019 11/24/22 0419 11/24/22 0806  BP: (!) 163/67 130/61 128/63 121/62  Pulse: 72 (!) 54 (!) 54 (!) 55  Resp: 18 18 18 14   Temp: 97.8 F (36.6 C) 97.6 F (36.4 C) 98.2 F (36.8 C) 98 F (36.7 C)  TempSrc: Oral Oral Oral   SpO2: 100% 99% 100% 100%  Weight:      Height:        Intake/Output Summary (Last 24 hours) at 11/24/2022 0814 Last data filed at 11/24/2022 0300 Gross per 24 hour  Intake 240 ml  Output 1050 ml  Net -810 ml   Filed Weights   11/09/22 1053  Weight: 37.2 kg    Examination:  General exam: appears comfortable. Cachetic  Respiratory system: diminished breath sounds b/l  Cardiovascular system: S1/S2+. No rubs or clicks  Gastrointestinal system: abd is soft, NT, ND & hypoactive bowel sounds  Central nervous system: moves all extremities  Psychiatry: judgement and insight appears poor. Flat mood and affect     Data Reviewed: I have personally reviewed following labs and imaging studies  CBC: Recent Labs  Lab 11/19/22 0454 11/21/22 0456 11/24/22 0502  WBC 5.5 5.4 5.0  HGB 8.6* 9.3* 8.9*  HCT 29.6* 31.8* 30.6*  MCV 80.4 79.3* 79.1*  PLT 275 286 267   Basic Metabolic Panel: Recent Labs  Lab  11/19/22 0454 11/21/22 0456 11/24/22 0502  NA 141 140 137  K 4.4 4.9 4.4  CL 106 105 102  CO2 27 27 28   GLUCOSE 86 86 86  BUN 26* 25* 24*  CREATININE 0.76 0.81 0.85  CALCIUM 8.5* 8.6* 8.9  MG 2.2 2.2 2.3   GFR: Estimated Creatinine Clearance: 24.8 mL/min (by C-G formula based on SCr of 0.85 mg/dL). Liver Function Tests: No results for input(s): "AST", "ALT", "ALKPHOS", "BILITOT", "PROT", "ALBUMIN" in the last 168 hours. No results for input(s): "LIPASE", "AMYLASE" in the last 168 hours. No results for input(s): "AMMONIA" in the last 168 hours. Coagulation Profile: No results for input(s): "INR", "PROTIME" in the last 168 hours. Cardiac Enzymes: No results for input(s): "CKTOTAL", "CKMB", "CKMBINDEX", "TROPONINI" in the last 168 hours. BNP (last 3 results) No results for input(s): "PROBNP" in the last 8760 hours. HbA1C: No results for input(s): "HGBA1C" in the last 72 hours. CBG: No results for input(s): "GLUCAP" in the last 168 hours. Lipid Profile: No results for input(s): "CHOL", "HDL", "LDLCALC", "TRIG", "CHOLHDL", "LDLDIRECT" in the last 72 hours. Thyroid Function Tests: No results for input(s): "TSH", "T4TOTAL", "FREET4", "T3FREE", "THYROIDAB" in the last 72 hours. Anemia Panel: No results for input(s): "VITAMINB12", "FOLATE", "FERRITIN", "TIBC", "IRON", "RETICCTPCT" in the last 72 hours.  Sepsis Labs: No results for input(s): "PROCALCITON", "LATICACIDVEN" in the last 168 hours.  No results found for this or any previous visit (from the past 240 hour(s)).       Radiology Studies: No results found.      Scheduled Meds:  amoxicillin-clavulanate  1 tablet Oral Q12H   vitamin C  500 mg Oral BID   aspirin EC  81 mg Oral Daily   brimonidine  1 drop Both Eyes BID   calcitonin (salmon)  1 spray Alternating Nares Daily   cholecalciferol  1,000 Units Oral Daily   enoxaparin (LOVENOX) injection  30 mg Subcutaneous QHS   feeding supplement  237 mL Oral TID BM    felodipine  5 mg Oral Daily   latanoprost  1 drop Left Eye QHS   Latanoprostene Bunod  1 drop Ophthalmic QHS   letrozole  2.5 mg Oral Daily   multivitamin with minerals  1 tablet Oral Daily   sodium bicarbonate  650 mg Oral BID   zinc sulfate  220 mg Oral Daily   Continuous Infusions:  sodium chloride 10 mL/hr at 11/12/22 1251     LOS: 15 days        Charise Killian, MD [facility] Pager [phone]  If 7PM-7AM, please contact night-coverage [email or web address]  11/24/2022, 8:14 AM

## 2022-11-24 NOTE — TOC Progression Note (Signed)
Transition of Care [facility]) - Progression Note    Patient Details  Name: Cassandra Patterson MRN: [id number] Date of Birth: 04-24-1928  [facility] CM/SW Contact  Garret Reddish, RN Phone Number: 11/24/2022, 12:30 PM  Clinical Narrative:     Chart reviewed.  I have spoken with [name] patient's sister.  She has informed me that she has sent requested HCPOA paperwork to [employer].  She reports that [employer] Team member Rob reports that he will also need a note from Provider stating that patient able to make her own decisions.   I have spoken with [name], team member at [facility].  He informs me that he has followed up with his Compliance office and the Durable HPOA paperwork that was sent will be acceptable.  He reports that he will need a letter from the provider that states she is not able to make medical decisions.    I have faxed Letter from Provider to [phone].    I have informed [name] of the above information.  [name] has requested confirmation that [name] did receive HCPOA paperwork and letter that was sent by me.  I have left a voice message for [name] to confirm that he has received information sent and the request for confirmation from [name].    TOC will continue to follow for discharge planning.    Expected Discharge Plan: Skilled Nursing Facility Barriers to Discharge: Continued Medical Work up, SNF Pending bed offer, Insurance Authorization  Expected Discharge Plan and Services                                               Social Determinants of Health (SDOH) Interventions SDOH Screenings   Food Insecurity: Patient Unable To Answer (11/09/2022)  Housing: Patient Unable To Answer (11/09/2022)  Transportation Needs: Patient Unable To Answer (11/09/2022)  Utilities: Patient Unable To Answer (11/09/2022)  Tobacco Use: Low Risk  (11/09/2022)    Readmission Risk Interventions     No data to  display

## 2022-11-25 DIAGNOSIS — C7951 Secondary malignant neoplasm of bone: Secondary | ICD-10-CM | POA: Diagnosis not present

## 2022-11-25 DIAGNOSIS — Z170 Estrogen receptor positive status [ER+]: Secondary | ICD-10-CM | POA: Diagnosis not present

## 2022-11-25 DIAGNOSIS — D649 Anemia, unspecified: Secondary | ICD-10-CM | POA: Diagnosis not present

## 2022-11-25 DIAGNOSIS — C50812 Malignant neoplasm of overlapping sites of left female breast: Secondary | ICD-10-CM | POA: Diagnosis not present

## 2022-11-25 DIAGNOSIS — C50919 Malignant neoplasm of unspecified site of unspecified female breast: Secondary | ICD-10-CM | POA: Diagnosis not present

## 2022-11-25 NOTE — Plan of Care (Signed)

## 2022-11-25 NOTE — Progress Notes (Signed)
Hematology/Oncology Progress note [phone] [phone]     Patient Care Team: Leanna Sato, MD as PCP - General (Family Medicine) End, Cristal Deer, MD as PCP - Cardiology (Cardiology)   Name of the patient: Cassandra Patterson  [id number]  1928-11-11  Date of visit: 11/25/22   INTERVAL HISTORY-    11/12/2022 status post left breast/chest wall mass biopsy by IR. Pathology showed metastatic moderately differentiated adenocarcinoma consistent with breast primary.  ER positive, GATA3 positive.  PR and HER2 immunohistochemistry staining is pending. ER 90% positive, ER 90% positive, HER2 negative.   11/18/2022 started on Letrozole 2.5mg  daily.  10.09/2022 patient tolerates letrozole well.  Sister [name] and brother are at the bedside. Patient is sitting in chair.  Voices no new complaints.    No Known Allergies  Patient Active Problem List   Diagnosis Date Noted   Palliative care encounter 11/14/2022   Malignant neoplasm of overlapping sites of left breast in female, estrogen receptor positive (HCC) 11/14/2022   Metastasis to bone (HCC) 11/10/2022   Normocytic anemia 11/10/2022   Pressure injury of skin 11/10/2022   Weakness 11/09/2022   Severe protein-calorie malnutrition (HCC) 11/09/2022   AKI (acute kidney injury) (HCC) 11/09/2022   Lytic bone lesion of hip 11/09/2022   Dry skin 11/09/2022   History of TIA (transient ischemic attack) 05/01/2021   TIA (transient ischemic attack) 01/06/2021   Mobitz type 1 second degree AV block 02/22/2020   Palpitations 01/26/2020   Heart murmur 01/26/2020   Essential hypertension 01/26/2020   Bradycardia 01/26/2020   First degree AV block 01/26/2020     Past Medical History:  Diagnosis Date   Cataracts, bilateral    Glaucoma    Hypertension    Mobitz type 1 second degree atrioventricular block    TIA (transient ischemic attack)      Past Surgical History:  Procedure Laterality Date   CATARACT  EXTRACTION      Social History   Socioeconomic History   Marital status: Married    Spouse name: Not on file   Number of children: Not on file   Years of education: Not on file   Highest education level: Not on file  Occupational History   Not on file  Tobacco Use   Smoking status: Never   Smokeless tobacco: Never  Vaping Use   Vaping status: Never Used  Substance and Sexual Activity   Alcohol use: No   Drug use: No   Sexual activity: Not Currently  Other Topics Concern   Not on file  Social History Narrative   Not on file   Social Determinants of Health   Financial Resource Strain: Not on file  Food Insecurity: Patient Unable To Answer (11/09/2022)   Hunger Vital Sign    Worried About Running Out of Food in the Last Year: Patient unable to answer    Ran Out of Food in the Last Year: Patient unable to answer  Transportation Needs: Patient Unable To Answer (11/09/2022)   PRAPARE - Transportation    Lack of Transportation (Medical): Patient unable to answer    Lack of Transportation (Non-Medical): Patient unable to answer  Physical Activity: Not on file  Stress: Not on file  Social Connections: Not on file  Intimate Partner Violence: Patient Unable To Answer (11/09/2022)   Humiliation, Afraid, Rape, and Kick questionnaire    Fear of Current or Ex-Partner: Patient unable to answer    Emotionally Abused: Patient unable to answer    Physically Abused: Patient  unable to answer    Sexually Abused: Patient unable to answer     Family History  Problem Relation Age of Onset   Hypertension Mother    Heart disease Neg Hx      Current Facility-Administered Medications:    acetaminophen (TYLENOL) tablet 500 mg, 500 mg, Oral, Q6H PRN, Darlin Priestly, MD   amoxicillin-clavulanate (AUGMENTIN) 500-125 MG per tablet 1 tablet, 1 tablet, Oral, Q12H, Charise Killian, MD, 1 tablet at 11/25/22 1610   ascorbic acid (VITAMIN C) tablet 500 mg, 500 mg, Oral, BID, Charise Killian, MD,  500 mg at 11/25/22 9604   aspirin EC tablet 81 mg, 81 mg, Oral, Daily, Cox, Amy N, DO, 81 mg at 11/25/22 0947   brimonidine (ALPHAGAN) 0.15 % ophthalmic solution 1 drop, 1 drop, Both Eyes, BID, Cox, Amy N, DO, 1 drop at 11/25/22 0948   calcitonin (salmon) (MIACALCIN/FORTICAL) nasal spray 1 spray, 1 spray, Alternating Nares, Daily, Cox, Amy N, DO, 1 spray at 11/25/22 1022   cholecalciferol (VITAMIN D3) tablet 1,000 Units, 1,000 Units, Oral, Daily, Cox, Amy N, DO, 1,000 Units at 11/25/22 0946   enoxaparin (LOVENOX) injection 30 mg, 30 mg, Subcutaneous, QHS, Cox, Amy N, DO, 30 mg at 11/24/22 2200   feeding supplement (ENSURE ENLIVE / ENSURE PLUS) liquid 237 mL, 237 mL, Oral, TID BM, Darlin Priestly, MD, 237 mL at 11/24/22 0932   felodipine (PLENDIL) 24 hr tablet 5 mg, 5 mg, Oral, Daily, Cox, Amy N, DO, 5 mg at 11/25/22 0946   latanoprost (XALATAN) 0.005 % ophthalmic solution 1 drop, 1 drop, Left Eye, QHS, Cox, Amy N, DO, 1 drop at 11/24/22 2202   Latanoprostene Bunod 0.024 % SOLN 1 drop, 1 drop, Ophthalmic, QHS, Cox, Amy N, DO, 1 drop at 11/24/22 2202   letrozole [facility]) tablet 2.5 mg, 2.5 mg, Oral, Daily, Rickard Patience, MD, 2.5 mg at 11/25/22 0946   multivitamin with minerals tablet 1 tablet, 1 tablet, Oral, Daily, Cox, Amy N, DO, 1 tablet at 11/25/22 0946   ondansetron (ZOFRAN-ODT) disintegrating tablet 4 mg, 4 mg, Oral, Q8H PRN, Charise Killian, MD, 4 mg at 11/22/22 1501   Oral care mouth rinse, 15 mL, Mouth Rinse, PRN, Charise Killian, MD   senna-docusate (Senokot-S) tablet 1 tablet, 1 tablet, Oral, QHS PRN, Cox, Amy N, DO, 1 tablet at 11/22/22 1023   sodium bicarbonate tablet 650 mg, 650 mg, Oral, BID, Charise Killian, MD, 650 mg at 11/25/22 0946   Physical exam:  Vitals:   11/24/22 1656 11/24/22 2015 11/25/22 0411 11/25/22 0755  BP: (!) 159/69 (!) 154/61 (!) 162/61 (!) 156/66  Pulse: 74 62 (!) 56 (!) 57  Resp: 15 18 18 20   Temp: 98.6 F (37 C) 98.5 F (36.9 C) 98.2 F (36.8 C) 98.3 F  (36.8 C)  TempSrc:  Oral Oral   SpO2: 98% 100% 99% 99%  Weight:      Height:       Physical Exam Constitutional:      Appearance: She is ill-appearing.  HENT:     Head: Normocephalic.  Eyes:     General: No scleral icterus. Cardiovascular:     Rate and Rhythm: Normal rate.  Pulmonary:     Effort: Pulmonary effort is normal. No respiratory distress.  Abdominal:     General: Abdomen is flat.  Musculoskeletal:     Cervical back: Normal range of motion.     Right lower leg: No edema.     Left lower  leg: No edema.  Skin:    General: Skin is warm and dry.  Neurological:     Mental Status: She is alert. Mental status is at baseline.  Psychiatric:        Mood and Affect: Mood normal.     Palpable left breast mass, axillary lymphadenopathy  Labs    Latest Ref Rng & Units 11/24/2022    5:02 AM 11/21/2022    4:56 AM 11/19/2022    4:54 AM  CBC  WBC 4.0 - 10.5 K/uL 5.0  5.4  5.5   Hemoglobin 12.0 - 15.0 g/dL 8.9  9.3  8.6   Hematocrit 36.0 - 46.0 % 30.6  31.8  29.6   Platelets 150 - 400 K/uL 267  286  275       Latest Ref Rng & Units 11/24/2022    5:02 AM 11/21/2022    4:56 AM 11/19/2022    4:54 AM  CMP  Glucose 70 - 99 mg/dL 86  86  86   BUN 8 - 23 mg/dL 24  25  26    Creatinine 0.44 - 1.00 mg/dL 1.61  0.96  0.45   Sodium 135 - 145 mmol/L 137  140  141   Potassium 3.5 - 5.1 mmol/L 4.4  4.9  4.4   Chloride 98 - 111 mmol/L 102  105  106   CO2 22 - 32 mmol/L 28  27  27    Calcium 8.9 - 10.3 mg/dL 8.9  8.6  8.5      RADIOGRAPHIC STUDIES: I have personally reviewed the radiological images as listed and agreed with the findings in the report. CT ASPIRATION N/S  Result Date: 11/12/2022 INDICATION: 89 year old female referred for biopsy L5 vertebral body, with inability to position on left decubitus or prone. We will proceed after speaking with oncology team in the patient's family with a left chest wall/breast mass biopsy EXAM: CT BIOPSY PUNCTURE ASPIRATION SUPERFICIAL FLUID  MEDICATIONS: None. ANESTHESIA/SEDATION: Moderate (conscious) sedation was not employed during this procedure. A total of Versed 0.5 mg and Fentanyl 0 mcg was administered intravenously. Moderate Sedation Time: 0 minutes. The patient's level of consciousness and vital signs were monitored continuously by radiology nursing throughout the procedure under my direct supervision. FLUOROSCOPY TIME:  CT COMPLICATIONS: None PROCEDURE: Informed written consent was obtained from the patient after a thorough discussion of the procedural risks, benefits and alternatives. All questions were addressed. Maximal Sterile Barrier Technique was utilized including caps, mask, sterile gowns, sterile gloves, sterile drape, hand hygiene and skin antiseptic. A timeout was performed prior to the initiation of the procedure. Patient was positioned supine on the CT gantry table. Scout CT of the chest performed for planning purposes. The patient is prepped and draped in the usual sterile fashion. 1% lidocaine was used for local anesthesia. Using CT guidance, guide needle was advanced into the solid mass of the left chest wall/breast. Once we confirmed needle tip position multiple 18 gauge core biopsy were acquired. Needle was removed. Final CT was acquired. Manual pressure was used for hemostasis. Sterile bandage was placed. Patient tolerated the procedure well and remained hemodynamically stable throughout. No complications were encountered and no significant blood loss. IMPRESSION: Status post CT-guided biopsy of left chest wall mass/breast mass. Signed, Yvone Neu. Miachel Roux, RPVI [facility] Radiology Electronically Signed   By: Gilmer Mor D.O.   On: 11/12/2022 16:07   MR THORACIC SPINE W WO CONTRAST  Result Date: 11/11/2022 CLINICAL DATA:  Metastatic disease evaluation.  Weakness. EXAM: MRI THORACIC WITHOUT AND WITH CONTRAST TECHNIQUE: Multiplanar and multiecho pulse sequences of  the thoracic spine were obtained without and with intravenous contrast. CONTRAST:  4mL GADAVIST GADOBUTROL 1 MMOL/ML IV SOLN COMPARISON:  CT chest 11/10/2022 FINDINGS: Multiple sequences are moderately motion degraded. Alignment: Increased thoracic kyphosis. Thoracic dextroscoliosis. No significant listhesis. Vertebrae: Widespread enhancing lesions throughout the thoracic spine as well as the cervical and included upper lumbar spine. Large destructive lesion involving the right aspect of the T9 vertebral body and pedicle as seen on CT with right-sided ventral and lateral epidural tumor not resulting in significant spinal stenosis or spinal cord mass effect. Small volume left-sided ventral epidural tumor at T2 without stenosis. T6 and T8 compression fractures with 50% anterior vertebral body height loss. Multiple rib lesions and rib fractures, better demonstrated on the prior CT. Cord:  Normal signal and morphology. Paraspinal and other soft tissues: Small bilateral pleural effusions. Disc levels: Mild thoracic spondylosis. No spinal stenosis. Moderate right neural foraminal encroachment at T9-10 due to tumor. IMPRESSION: 1. Widespread osseous metastases. 2. Epidural tumor at T2 and T9 without significant spinal stenosis. 3. T6 and T8 compression fractures. Electronically Signed   By: Sebastian Ache M.D.   On: 11/11/2022 18:34   CT CHEST WO CONTRAST  Result Date: 11/11/2022 CLINICAL DATA:  Breast cancer staging.  * Tracking Code: BO * EXAM: CT CHEST WITHOUT CONTRAST TECHNIQUE: Multidetector CT imaging of the chest was performed following the standard protocol without IV contrast. RADIATION DOSE REDUCTION: This exam was performed according to the departmental dose-optimization program which includes automated exposure control, adjustment of the mA and/or kV according to patient size and/or use of iterative reconstruction technique. COMPARISON:  None Available. FINDINGS: Cardiovascular: The heart is markedly  enlarged. No substantial pericardial effusion. Ascending thoracic aorta measures on the order of 4.2 cm diameter. Descending thoracic aorta measures 3.8 cm diameter. Enlargement of the pulmonary outflow tract/main pulmonary arteries suggests pulmonary arterial hypertension. There is moderate atherosclerotic calcification of the abdominal aorta without aneurysm. Mediastinum/Nodes: 1.6 cm nodule identified right thyroid lobe. Upper normal mediastinal lymph nodes evident although assessment limited by lack of mediastinal fat and lack of intravenous contrast material. No evidence for gross hilar lymphadenopathy although assessment is limited by the lack of intravenous contrast on the current study. The esophagus has normal imaging features. No right axillary lymphadenopathy. Left axilla incompletely visualized. 10 mm short axis left subpectoral lymph node seen on image 49/2 with probable greater than 18 mm short axis left axillary node on 76/2, incompletely visualized. Left breast is been incompletely visualized with greater than 3 cm homogeneous well-defined incompletely visualized structure noted on image 110/2. Lungs/Pleura: Centrilobular emphsyema noted. Perifissural nodule measuring 4 mm noted right lung on 60/4 with additional 4-5 mm nodules in the right lung on images 68 and 89 of series 4. There is dependent collapse/consolidation in both lower lungs with small bilateral pleural effusions. Upper Abdomen: Unremarkable on noncontrast imaging. Musculoskeletal: Multiple lytic bone metastases evident in the thoracic spine and ribs including posterior right second rib on image 3/series 2. Metastatic lesion in the posterior T2 vertebral body erodes the posterior cortex. 3.1 cm soft tissue lesion in the T9 vertebral body destroys the right pedicle and posterior cortex of the vertebral body with tumor extension into the spinal canal (see image 68/2). Chronic fracture nonunion noted in the right clavicle pathologic  fracture noted posterior right ninth and tenth ribs as well as the posterior left twelfth in  tenth ribs. Multiple nonacute left-sided rib fractures evident. IMPRESSION: 1. Multiple lytic bone metastases in the thoracic spine and ribs. 3.1 cm soft tissue lesion in the T9 vertebral body destroys the right pedicle and posterior cortex of the vertebral body with tumor extension into the spinal canal. MRI of the thoracic spine with and without contrast recommended to further evaluate. 2. Metastatic lesion in the T2 vertebral body destroys the posterior cortex on sagittal imaging suggests extension in the anterior spinal canal. 3. Pathologic fractures of the posterior right ninth and tenth ribs as well as the posterior left twelfth and tenth ribs. Innumerable thoracic spine and rib metastases evident. 4. Left axillary and subpectoral lymphadenopathy, incompletely visualized. 5. 1.6 cm right thyroid lobe nodule. In the setting of significant comorbidities or limited life expectancy, no follow-up recommended (ref: J Am Coll Radiol. 2015 Feb;12(2): 143-50). 6. Small bilateral pleural effusions with dependent collapse/consolidation in both lower lungs. 7. Tiny right lung nodules.  Metastatic disease not excluded. 8. Aortic Atherosclerosis (ICD10-I70.0) and Emphysema (ICD10-J43.9). Electronically Signed   By: Kennith Center M.D.   On: 11/11/2022 10:29   CT ABDOMEN PELVIS W CONTRAST  Result Date: 11/09/2022 CLINICAL DATA:  Acute abdominal pain EXAM: CT ABDOMEN AND PELVIS WITH CONTRAST TECHNIQUE: Multidetector CT imaging of the abdomen and pelvis was performed using the standard protocol following bolus administration of intravenous contrast. RADIATION DOSE REDUCTION: This exam was performed according to the departmental dose-optimization program which includes automated exposure control, adjustment of the mA and/or kV according to patient size and/or use of iterative reconstruction technique. CONTRAST:  75mL OMNIPAQUE  IOHEXOL 300 MG/ML  SOLN COMPARISON:  None Available. FINDINGS: Lower chest: T9 vertebral body lytic metastasis. Multiple lytic rib metastases. 3.1 cm soft tissue mass left lateral body wall. No pleural or pericardial effusion. Dependent atelectasis in the lung bases. 3.7 cm cystic lesion in the left breast. Hepatobiliary: Subcentimeter calcified stone in the dependent aspect of the nondilated gallbladder. No focal liver lesion or biliary ductal dilatation. Pancreas: Unremarkable. No pancreatic ductal dilatation or surrounding inflammatory changes. Spleen: Normal in size without focal abnormality. Adrenals/Urinary Tract: No adrenal mass. Symmetric renal parenchymal enhancement without focal lesion or evident urolithiasis. Urinary bladder nondistended. Stomach/Bowel: Stomach decompressed. Small bowel nondilated. Normal appendix. The colon is partially distended, without acute finding. Vascular/Lymphatic: Moderate scattered calcified aortoiliac atheromatous plaque without aneurysm or stenosis. Portal vein patent. No abdominal or pelvic adenopathy. Reproductive: Status post hysterectomy. No adnexal masses. Other: No ascites.  No free air. Musculoskeletal: Multiple lytic bone lesions throughout the lumbosacral spine, left ischium, bilateral iliac bones. Lytic lesion in the inferior left ischial tuberosity with pathologic fracture, minimally displaced. Pathologic compression deformity of L5 with a greater than 50% loss of height. Mild compression deformity of T12. fixation hardware in the posterior left acetabulum. IMPRESSION: 1. Multiple lytic bone lesions throughout the lumbosacral spine, left ischium, bilateral iliac bones, and ribs. Pathologic fractures of the inferior left ischial tuberosity, T12 and L5 vertebral bodies. 2. 3.1 cm soft tissue mass left lateral body wall. 3. 3.7 cm cystic lesion in the left breast. 4. Cholelithiasis. 5.  Aortic Atherosclerosis (ICD10-I70.0). Electronically Signed   By: Corlis Leak M.D.    On: 11/09/2022 13:53   CT Head Wo Contrast  Result Date: 11/09/2022 CLINICAL DATA:  Mental status change with unknown cause. Neck trauma EXAM: CT HEAD WITHOUT CONTRAST CT CERVICAL SPINE WITHOUT CONTRAST TECHNIQUE: Multidetector CT imaging of the head and cervical spine was performed following the standard protocol without intravenous contrast.  Multiplanar CT image reconstructions of the cervical spine were also generated. RADIATION DOSE REDUCTION: This exam was performed according to the departmental dose-optimization program which includes automated exposure control, adjustment of the mA and/or kV according to patient size and/or use of iterative reconstruction technique. COMPARISON:  Brain MRI 01/07/2021 FINDINGS: CT HEAD FINDINGS Brain: No evidence of acute infarction, hemorrhage, hydrocephalus, extra-axial collection or mass lesion/mass effect. Small right cerebellar infarct, chronic by prior MRI. Generalized brain atrophy. Vascular: No hyperdense vessel or unexpected calcification. Skull: Multiple lytic lesions scattered throughout the calvarium. Parasagittal right frontal and parasagittal left parietal deposits erode the inner table. Additional notable lesion in the left sphenoid wing eroding the inner table. Sinuses/Orbits: No acute finding CT CERVICAL SPINE FINDINGS Alignment: Normal Skull base and vertebrae: No acute fracture. Multiple lytic bone lesions seen in the C3, C6, C7, T1, T2, and T3 bodies. Notable posterior element deposits at T1-T4. Partially covered nonacute right clavicle fracture with bulky callus that could obscure underlying lesion. Soft tissues and spinal canal: No prevertebral fluid or swelling. No visible canal hematoma. Disc levels:  Ordinary degenerative spurring. Upper chest: Sizable metastatic deposit at the right second rib posteriorly. IMPRESSION: Widespread osseous metastatic disease including deposits that erodes through the inner table. No acute intracranial finding.  No  visible brain metastasis. Electronically Signed   By: Tiburcio Pea M.D.   On: 11/09/2022 11:45   CT Cervical Spine Wo Contrast  Result Date: 11/09/2022 CLINICAL DATA:  Mental status change with unknown cause. Neck trauma EXAM: CT HEAD WITHOUT CONTRAST CT CERVICAL SPINE WITHOUT CONTRAST TECHNIQUE: Multidetector CT imaging of the head and cervical spine was performed following the standard protocol without intravenous contrast. Multiplanar CT image reconstructions of the cervical spine were also generated. RADIATION DOSE REDUCTION: This exam was performed according to the departmental dose-optimization program which includes automated exposure control, adjustment of the mA and/or kV according to patient size and/or use of iterative reconstruction technique. COMPARISON:  Brain MRI 01/07/2021 FINDINGS: CT HEAD FINDINGS Brain: No evidence of acute infarction, hemorrhage, hydrocephalus, extra-axial collection or mass lesion/mass effect. Small right cerebellar infarct, chronic by prior MRI. Generalized brain atrophy. Vascular: No hyperdense vessel or unexpected calcification. Skull: Multiple lytic lesions scattered throughout the calvarium. Parasagittal right frontal and parasagittal left parietal deposits erode the inner table. Additional notable lesion in the left sphenoid wing eroding the inner table. Sinuses/Orbits: No acute finding CT CERVICAL SPINE FINDINGS Alignment: Normal Skull base and vertebrae: No acute fracture. Multiple lytic bone lesions seen in the C3, C6, C7, T1, T2, and T3 bodies. Notable posterior element deposits at T1-T4. Partially covered nonacute right clavicle fracture with bulky callus that could obscure underlying lesion. Soft tissues and spinal canal: No prevertebral fluid or swelling. No visible canal hematoma. Disc levels:  Ordinary degenerative spurring. Upper chest: Sizable metastatic deposit at the right second rib posteriorly. IMPRESSION: Widespread osseous metastatic disease  including deposits that erodes through the inner table. No acute intracranial finding.  No visible brain metastasis. Electronically Signed   By: Tiburcio Pea M.D.   On: 11/09/2022 11:45   DG Chest 2 View  Result Date: 11/09/2022 CLINICAL DATA:  Chest pain.  Back and foot wounds. EXAM: CHEST - 2 VIEW COMPARISON:  01/06/2021 FINDINGS: Moderate cardiomegaly again noted. Ectasia and atherosclerotic calcification of the thoracic aorta again seen. Both lungs remain clear. No evidence of pneumothorax or pleural effusion. Several displaced left lateral rib fracture deformities are seen several displaced rib fractures are seen involving the left lateral  4th through 9th ribs, at least 1 of which shows periosteal new bone formation. IMPRESSION: Moderate cardiomegaly. No active lung disease. Multiple left lateral rib fractures, at least 1 of which shows periosteal new bone formation. Electronically Signed   By: Danae Orleans M.D.   On: 11/09/2022 11:16    Assessment and plan-   # Metastatic breast cancer with extensive bone metastasis, including epidural tumor at T2 and T9, compression fracture at T6 and T8. Pathology results were reviewed and discussed with patient's sisters. I recommend palliative radiation to her thoracic spine epidural tumor. Breast cancer is ER/PR strongly positive and HER2 negative.  She tolerates letrozole 2.5mg  daily well.  Continue current regimen.  Epidural tumor at T2 and T9  If family is interested in palliative radiation, she will establish care with radiation oncology for evaluation   # Normocytic anemia, She has microcytic orders iron panel is not consistent with iron deficiency anemia. normal B12 and folate retic panel.. She has increased nRBC Could be due to marrow involvement  Please transfuse PRBC to keep hemoglobin above 7.  Goals of care discussion.  Sister POA understands that patient's poor due to poor performance status, frailty, malnutrition. Her disease is not  curable. She is no candidate for aggressive treatments except endocrinology therapy. Palliative care service on board  Thank you for allowing me to participate in the care of this patient  Rickard Patience, MD, PhD Hematology Oncology 11/25/2022

## 2022-11-25 NOTE — Progress Notes (Signed)
PROGRESS NOTE   HPI was taken from Dr. Sedalia Muta: [name] is a 89 year old female with history of Mobitz type I second-degree heart block, history of TIA, history of hypertension, who presents to the emergency department for chief concerns of worsening generalized weakness.   Vitals in the ED showed temperature of 98, respiration rate 20, heart rate of 85, blood pressure 99/85, improved to 126/72, SpO2 99% on room air.   Serum sodium is  142, potassium 4.3, chloride 109, bicarb 20, BUN of 54, serum creatinine 1.27, EGFR 40, nonfasting blood glucose 104, WBC 8.6, hemoglobin 9.6, platelets of 239.   Chest x-ray 2 view: Moderate cardiomegaly.  No active lung disease.  Multiple left lateral rib fractures, at least 1 shows periosteal new bone formation.   CT head wo contrast and CT cervical spine wo contrast: Widespread osseous metastatic disease including deposits that erodes through the inner table.  No acute intracranial finding.  No visible brain metastasis.   CT abdomen pelvis w contrast: Multiple lytic bone lesions throughout lumbosacral spine, left ischium, bilateral iliac bones, and ribs.  Pathologic fracture inferior left ischial tuberosity, T12, L5 vertebral bodies.  3.1 cm soft tissue mass of the left lateral body wall.  3.7 cm cystic lesion in the left breast.  Cholelithiasis.  Aortic atherosclerosis.   ED treatment: LR 1 L bolus. ---------------------------- At bedside, patient patient was able to tell me her name, she states that she is 89 years old.  She knows the current calendar year and she knows she is in the hospital.   She notes her 2 sisters are at bedside.  She reports generalized weakness, is unable to tell me for how long this has been ongoing.  She denies being in pain currently. She endorses weight loss however denies lack of appetite.   As per Dr. Mayford Knife 10/2-10/8/24: Pt is medically stable. Pt's family has filed a second appeal to the second denial. Please  see CM's notes.     Cassandra Patterson  WUJ:[id number] DOB: [birth year] DOA: 11/09/2022 PCP: Cassandra Sato, MD   Assessment & Plan:   Principal Problem:   Weakness Active Problems:   AKI (acute kidney injury) (HCC)   Lytic bone lesion of hip   Severe protein-calorie malnutrition (HCC)   Dry skin   Essential hypertension   First degree AV block   Mobitz type 1 second degree AV block   TIA (transient ischemic attack)   History of TIA (transient ischemic attack)   Metastasis to bone (HCC)   Normocytic anemia   Pressure injury of skin   Palliative care encounter   Malignant neoplasm of overlapping sites of left breast in female, estrogen receptor positive (HCC)  Assessment and Plan: Weakness: likely secondary to metastatic cancer. PT/OT recs SNF but pt's insurance denied SNF even after peer to peer. Pt's sister is unable to care for pt by herself at home. No one else is available to care for pt at home   Breast cancer: w/ extensive lytic bone lesions w/ metastatic lesions of lumbosacral spine, left ischium, bilateral iliac bones, ribs, pathologic fractures at the inferior left ischial tuberosity, T12, L5 vertebral bodies.  Large palpable breast mass as per onco. CT abd/pelvis shows 3.1 cm soft tissue mass left lateral body wall, 3.7 cm cystic lesion in the left breast. CT chest shows multiple lytic bone metastases in T spine, ribs, 3.1 cm soft tissue lesion in T9 vertebral body destroys right pedicle w/ tumor extension into the spinal cord  with multiple other findings, see CT impression.   Pathology showed metastatic moderately differentiated adenocarcinoma consistent with breast primary. Continue on letrozole as per onco   Poor cognitive function: continue w/ supportive care    AKI: resolved   Anemia of chronic disease: H&H are labile. Will transfuse if Hb < 7.0    Severe protein-calorie malnutrition: continue on nutritional supplements     Hx of TIA: continue on aspirin   HTN:  continue on home dose of felodipine   Pressure injury left buttock: stage 3, present on admission. Continue w/ wound care    Partial thickness skin tear to left heel: w/ foul odor. Continue w/ daily wound care. Continue w/ augmentin        DVT prophylaxis: lovenox  Code Status: DNR Family Communication: discussed pt's care w/ pt's family at bedside  Disposition Plan: PT/OT recs SNF but pt's insurance are refusing SNF despite peer to peer. Pt's family appealed insurance denial    Level of care: Telemetry Medical Status is: Inpatient Remains inpatient appropriate because: Poor prognosis. Pt's family appealed pt's insurance denial    Consultants:  Onco Palliative care Hospice   Procedures:   Antimicrobials:    Subjective: Pt c/o malaise   Objective: Vitals:   11/24/22 1656 11/24/22 2015 11/25/22 0411 11/25/22 0755  BP: (!) 159/69 (!) 154/61 (!) 162/61 (!) 156/66  Pulse: 74 62 (!) 56 (!) 57  Resp: 15 18 18 20   Temp: 98.6 F (37 C) 98.5 F (36.9 C) 98.2 F (36.8 C) 98.3 F (36.8 C)  TempSrc:  Oral Oral   SpO2: 98% 100% 99% 99%  Weight:      Height:        Intake/Output Summary (Last 24 hours) at 11/25/2022 0820 Last data filed at 11/24/2022 1919 Gross per 24 hour  Intake 360 ml  Output 400 ml  Net -40 ml   Filed Weights   11/09/22 1053  Weight: 37.2 kg    Examination:  General exam: appears comfortable. Cachetic   Respiratory system: decreased breath sounds b/l  Cardiovascular system: S1 & S2+. No rubs or clicks  Gastrointestinal system: abd is soft, NT, ND & normal bowel sounds Central nervous system: moves all extremities  Psychiatry: judgement and insight appears poor. Flat mood and affect    Data Reviewed: I have personally reviewed following labs and imaging studies  CBC: Recent Labs  Lab 11/19/22 0454 11/21/22 0456 11/24/22 0502  WBC 5.5 5.4 5.0  HGB 8.6* 9.3* 8.9*  HCT 29.6* 31.8* 30.6*  MCV 80.4 79.3* 79.1*  PLT 275 286 267    Basic Metabolic Panel: Recent Labs  Lab 11/19/22 0454 11/21/22 0456 11/24/22 0502  NA 141 140 137  K 4.4 4.9 4.4  CL 106 105 102  CO2 27 27 28   GLUCOSE 86 86 86  BUN 26* 25* 24*  CREATININE 0.76 0.81 0.85  CALCIUM 8.5* 8.6* 8.9  MG 2.2 2.2 2.3   GFR: Estimated Creatinine Clearance: 24.8 mL/min (by C-G formula based on SCr of 0.85 mg/dL). Liver Function Tests: No results for input(s): "AST", "ALT", "ALKPHOS", "BILITOT", "PROT", "ALBUMIN" in the last 168 hours. No results for input(s): "LIPASE", "AMYLASE" in the last 168 hours. No results for input(s): "AMMONIA" in the last 168 hours. Coagulation Profile: No results for input(s): "INR", "PROTIME" in the last 168 hours. Cardiac Enzymes: No results for input(s): "CKTOTAL", "CKMB", "CKMBINDEX", "TROPONINI" in the last 168 hours. BNP (last 3 results) No results for input(s): "PROBNP" in the last  8760 hours. HbA1C: No results for input(s): "HGBA1C" in the last 72 hours. CBG: No results for input(s): "GLUCAP" in the last 168 hours. Lipid Profile: No results for input(s): "CHOL", "HDL", "LDLCALC", "TRIG", "CHOLHDL", "LDLDIRECT" in the last 72 hours. Thyroid Function Tests: No results for input(s): "TSH", "T4TOTAL", "FREET4", "T3FREE", "THYROIDAB" in the last 72 hours. Anemia Panel: No results for input(s): "VITAMINB12", "FOLATE", "FERRITIN", "TIBC", "IRON", "RETICCTPCT" in the last 72 hours.  Sepsis Labs: No results for input(s): "PROCALCITON", "LATICACIDVEN" in the last 168 hours.  No results found for this or any previous visit (from the past 240 hour(s)).       Radiology Studies: No results found.      Scheduled Meds:  amoxicillin-clavulanate  1 tablet Oral Q12H   vitamin C  500 mg Oral BID   aspirin EC  81 mg Oral Daily   brimonidine  1 drop Both Eyes BID   calcitonin (salmon)  1 spray Alternating Nares Daily   cholecalciferol  1,000 Units Oral Daily   enoxaparin (LOVENOX) injection  30 mg Subcutaneous QHS    feeding supplement  237 mL Oral TID BM   felodipine  5 mg Oral Daily   latanoprost  1 drop Left Eye QHS   Latanoprostene Bunod  1 drop Ophthalmic QHS   letrozole  2.5 mg Oral Daily   multivitamin with minerals  1 tablet Oral Daily   sodium bicarbonate  650 mg Oral BID   Continuous Infusions:     LOS: 16 days        Charise Killian, MD [facility] Pager [phone]  If 7PM-7AM, please contact night-coverage [email or web address]  11/25/2022, 8:20 AM

## 2022-11-25 NOTE — Progress Notes (Signed)
Occupational Therapy Treatment Patient Details Name: Cassandra Patterson MRN: [id number] DOB: March 25, 1928 Today's Date: 11/25/2022   History of present illness Pt is a 89 yo female that presented to ED for weakness. PMH of TIA, HTN, mobitz. Imaging showed multiple L rib fractures and further workup showed osseous metastatic disease.   OT comments  Pt is supine in bed on arrival. Easily arousable and agreeable to OT session. She denies pain. Pt performed supine to sit at EOB with CGA/Min. Engaged in seated balance exercises at EOB with unilateral support and SBA. OT educated on BUE RROM and BLE AROM HEP and had pt perform 10 reps of each to maximize her overall strength/endurance for carryover to bed mobility, transfers, and ADLs. Pt returned to supine in bed with SUP/SBA and all needs in place and will cont to require skilled acute OT services to maximize his safety and IND to return to PLOF.       If plan is discharge home, recommend the following:  Assistance with cooking/housework;Assist for transportation;Help with stairs or ramp for entrance;Supervision due to cognitive status;Two people to help with walking and/or transfers;A lot of help with bathing/dressing/bathroom   Equipment Recommendations  BSC/3in1    Recommendations for Other Services      Precautions / Restrictions Precautions Precaution Comments: multiple sites of metatasic lesions Restrictions Weight Bearing Restrictions: No       Mobility Bed Mobility Overal bed mobility: Needs Assistance Bed Mobility: Supine to Sit     Supine to sit: Min assist, Contact guard, HOB elevated, Used rails Sit to supine: Contact guard assist, Supervision, Used rails, HOB elevated   General bed mobility comments: pt able to lateral scoot up in bed while seated EOB with Min A and verb cues    Transfers                   General transfer comment: better seated balance today     Balance Overall balance assessment: Needs  assistance Sitting-balance support: Single extremity supported Sitting balance-Leahy Scale: Fair Sitting balance - Comments: unilateral support on EOB at all times to maintain seated balance with SBA and able to participate in functional reaching activity at EOB with unilateral support                                   ADL either performed or assessed with clinical judgement   ADL Overall ADL's : Needs assistance/impaired                                       General ADL Comments: better static and dynamic sitting balance today with unilateral support on bed at all times with CGA, would likely need assist with all ADLs    Extremity/Trunk Assessment Upper Extremity Assessment Upper Extremity Assessment: Generalized weakness   Lower Extremity Assessment Lower Extremity Assessment: Generalized weakness   Cervical / Trunk Assessment Cervical / Trunk Assessment: Kyphotic    Vision       Perception     Praxis      Cognition                                                Exercises Other  Exercises Other Exercises: pt performed BUE RROM exercises using lotion bottle in the room x10 reps as well as BLE AROM exercises seated at EOB in all planes at all joints to maximize full body strength/endurance    Shoulder Instructions       General Comments      Pertinent Vitals/ Pain       Pain Assessment Pain Assessment: No/denies pain  Home Living                                          Prior Functioning/Environment              Frequency  Min 1X/week        Progress Toward Goals  OT Goals(current goals can now be found in the care plan section)  Progress towards OT goals: Progressing toward goals  Acute Rehab OT Goals Patient Stated Goal: to feel better OT Goal Formulation: With patient Time For Goal Achievement: 12/01/22 Potential to Achieve Goals: Good  Plan      Co-evaluation           OT goals addressed during session: Strengthening/ROM      AM-PAC OT "6 Clicks" Daily Activity     Outcome Measure   Help from another person eating meals?: A Little Help from another person taking care of personal grooming?: A Little Help from another person toileting, which includes using toliet, bedpan, or urinal?: A Lot Help from another person bathing (including washing, rinsing, drying)?: A Lot Help from another person to put on and taking off regular upper body clothing?: A Little Help from another person to put on and taking off regular lower body clothing?: A Lot 6 Click Score: 15    End of Session    OT Visit Diagnosis: Other abnormalities of gait and mobility (R26.89);Muscle weakness (generalized) (M62.81);Unsteadiness on feet (R26.81)   Activity Tolerance Patient tolerated treatment well   Patient Left with bed alarm set;in bed;with call bell/phone within reach;with family/visitor present   Nurse Communication Mobility status        Time: 1452-1520 OT Time Calculation (min): 28 min  Charges: OT General Charges $OT Visit: 1 Visit OT Treatments $Therapeutic Exercise: 23-37 mins  Narek Kniss, OTR/L  11/25/22, 4:36 PM   [name] 11/25/2022, 4:34 PM

## 2022-11-25 NOTE — Progress Notes (Addendum)
Mobility Specialist - Progress Note   11/25/22 1042  Mobility  Activity Ambulated with assistance in room;Transferred from bed to chair  Level of Assistance Minimal assist, patient does 75% or more  Assistive Device Front wheel walker  Distance Ambulated (ft) 18 ft  Activity Response Tolerated well  $Mobility charge 1 Mobility     Pt lying in bed upon arrival, utilizing RA. Pt agreeable to activity. Increased time for processing instruction. Completed bed mobility with modA. Able to support self in sitting position with use of bed rails. STS with modA. VC for hand/foot placement and weight shifting anteriorly. Ambulated ~18' in room with minA + chair follow. Assist to negotiate RW during turns. No LOB. Fatigued with activity. Pt left in chair with needs in reach, family at bedside. Pillow wedge support to offset pain in tailbone and support mild L lateral lean while sitting in recliner.    Filiberto Pinks Mobility Specialist 11/25/22, 10:52 AM

## 2022-11-26 DIAGNOSIS — Z170 Estrogen receptor positive status [ER+]: Secondary | ICD-10-CM | POA: Diagnosis not present

## 2022-11-26 DIAGNOSIS — R531 Weakness: Secondary | ICD-10-CM | POA: Diagnosis not present

## 2022-11-26 DIAGNOSIS — C50812 Malignant neoplasm of overlapping sites of left female breast: Secondary | ICD-10-CM | POA: Diagnosis not present

## 2022-11-26 LAB — MAGNESIUM: Magnesium: 2.4 mg/dL (ref 1.7–2.4)

## 2022-11-26 LAB — CBC
HCT: 32.4 % — ABNORMAL LOW (ref 36.0–46.0)
Hemoglobin: 9.5 g/dL — ABNORMAL LOW (ref 12.0–15.0)
MCH: 22.8 pg — ABNORMAL LOW (ref 26.0–34.0)
MCHC: 29.3 g/dL — ABNORMAL LOW (ref 30.0–36.0)
MCV: 77.7 fL — ABNORMAL LOW (ref 80.0–100.0)
Platelets: 264 10*3/uL (ref 150–400)
RBC: 4.17 MIL/uL (ref 3.87–5.11)
RDW: 17.7 % — ABNORMAL HIGH (ref 11.5–15.5)
WBC: 4.7 10*3/uL (ref 4.0–10.5)
nRBC: 0 % (ref 0.0–0.2)

## 2022-11-26 LAB — BASIC METABOLIC PANEL
Anion gap: 7 (ref 5–15)
BUN: 23 mg/dL (ref 8–23)
CO2: 27 mmol/L (ref 22–32)
Calcium: 8.8 mg/dL — ABNORMAL LOW (ref 8.9–10.3)
Chloride: 103 mmol/L (ref 98–111)
Creatinine, Ser: 0.76 mg/dL (ref 0.44–1.00)
GFR, Estimated: >60 mL/min (ref >60)
Glucose, Bld: 85 mg/dL (ref 70–99)
Potassium: 4.6 mmol/L (ref 3.5–5.1)
Sodium: 137 mmol/L (ref 135–145)

## 2022-11-26 MED ORDER — AMOXICILLIN-POT CLAVULANATE 500-125 MG PO TABS
1.0000 | ORAL_TABLET | Freq: Two times a day (BID) | ORAL | Status: AC
  Administered 2022-11-26: 1 via ORAL
  Filled 2022-11-26: qty 1

## 2022-11-26 NOTE — Progress Notes (Signed)
Nutrition Follow-up  DOCUMENTATION CODES:   Severe malnutrition in context of chronic illness, Underweight  INTERVENTION:   -Continue Ensure Enlive po TID, each supplement provides 350 kcal and 20 grams of protein.  -Continue Magic cup BID with meals, each supplement provides 290 kcal and 9 grams of protein  -Continue MVI with minerals daily -Continue regular diet -Continue 500 mg vitamin C BID -Continue 220 mg zinc sulfate daily x 14 days   NUTRITION DIAGNOSIS:   Severe Malnutrition related to chronic illness (concern for widespread metastatic disease) as evidenced by moderate muscle depletion, severe muscle depletion, moderate fat depletion, severe fat depletion, percent weight loss.  Ongoing  GOAL:   Patient will meet greater than or equal to 90% of their needs  Progressing   MONITOR:   PO intake, Supplement acceptance  REASON FOR ASSESSMENT:   Consult Assessment of nutrition requirement/status  ASSESSMENT:   Pt with history of Mobitz type I second-degree heart block, history of TIA, history of hypertension, who presents for chief concerns of worsening generalized weakness.  Reviewed I/O's: -580 ml x 24 hours and -4.3 L since 11/12/22   Intake has improved since last visit. Noted meal completions 50-100%. Pt with variable acceptance of Ensure supplements.   No new wt since admission.   Per palliative care notes, pt limited code (DNR, DNI). Pt awaiting insurance approval for SNF.   Mediations reviewed and include vitamin C, vitamin D3, and sodium bicarbonate.   Labs reviewed: CBGS: 215 (inpatient orders for glycemic control are none).    Diet Order:   Diet Order             Diet regular Room service appropriate? Yes; Fluid consistency: Thin  Diet effective now                   EDUCATION NEEDS:   No education needs have been identified at this time  Skin:  Skin Assessment: Skin Integrity Issues: Skin Integrity Issues:: Stage III, Other  (Comment) Stage III: sacrum, buttocks Other: lt heel laceration  Last BM:  11/23/22  Height:   Ht Readings from Last 1 Encounters:  11/09/22 5\' 3"  (1.6 m)    Weight:   Wt Readings from Last 1 Encounters:  11/09/22 37.2 kg    Ideal Body Weight:  52.3 kg  BMI:  Body mass index is 14.53 kg/m.  Estimated Nutritional Needs:   Kcal:  1450-1650  Protein:  60-75 grams  Fluid:  > 1.4 L    Levada Schilling, RD, LDN, CDCES Registered Dietitian III Certified Diabetes Care and Education Specialist Please refer to [facility] for RD and/or RD on-call/weekend/after hours pager

## 2022-11-26 NOTE — Progress Notes (Signed)
Physical Therapy Treatment Patient Details Name: Cassandra Patterson MRN: [id number] DOB: 09-02-1928 Today's Date: 11/26/2022   History of Present Illness Pt is a 89 yo female that presented to ED for weakness. PMH of TIA, HTN, mobitz. Imaging showed multiple L rib fractures and further workup showed osseous metastatic disease.    PT Comments  Pt was sitting in recliner upon arrival. She is alert and agreeable but overall confused and disoriented. Pleasantly confuse with supportive sisters at bedside. She was able to stand and ambulate in room ~ 15 ft. Distance limited due to fatigue. Posterior push noted in both sitting and standing. Pt will benefit from continued skilled PT to maximize her independence while decreasing caregiver burden.    If plan is discharge home, recommend the following: A lot of help with walking and/or transfers;A lot of help with bathing/dressing/bathroom;Assistance with cooking/housework;Direct supervision/assist for medications management;Direct supervision/assist for financial management;Assist for transportation;Help with stairs or ramp for entrance;Supervision due to cognitive status     Equipment Recommendations  Other (comment) (Defer to next level of care.)       Precautions / Restrictions Precautions Precautions: Fall Precaution Comments: multiple sites of metatasic lesions Restrictions Weight Bearing Restrictions: No     Mobility  Bed Mobility Overal bed mobility: Needs Assistance Bed Mobility: Sit to Supine  Sit to supine: Mod assist   Transfers Overall transfer level: Needs assistance Equipment used: Rolling walker (2 wheels) Transfers: Sit to/from Stand Sit to Stand: Mod assist, Max assist  General transfer comment: mod-max to stand from recliner > RW. Pt has posterior push that improved with vcs and TCs. pt needs constant vcs + assistance to propel RW fwd.    Ambulation/Gait Ambulation/Gait assistance: Min assist Gait Distance (Feet): 15  Feet Assistive device: Rolling walker (2 wheels) Gait Pattern/deviations: Step-to pattern Gait velocity: decreased  General Gait Details: Posterior push noted with vcs and assistance to fwd wt shift. Author assisted with propelling RW fwd. Pt very limited by fatigue    Balance Overall balance assessment: Needs assistance Sitting-balance support: Feet supported Sitting balance-Leahy Scale: Poor     Standing balance support: Bilateral upper extremity supported, During functional activity, Reliant on assistive device for balance Standing balance-Leahy Scale: Poor       Cognition Arousal: Alert Behavior During Therapy: WFL for tasks assessed/performed Overall Cognitive Status: Impaired/Different from baseline Area of Impairment: Orientation, Following commands, Awareness, Problem solving, Safety/judgement, Attention, Memory    Orientation Level: Disoriented to, Situation, Time     Following Commands: Follows one step commands inconsistently, Follows one step commands with increased time Safety/Judgement: Decreased awareness of safety, Decreased awareness of deficits Awareness: Intellectual Problem Solving: Slow processing, Decreased initiation, Difficulty sequencing, Requires verbal cues, Requires tactile cues                 Pertinent Vitals/Pain Pain Assessment Pain Assessment: 0-10 Pain Score: 6  Faces Pain Scale: Hurts a little bit Pain Location: grimaces with standing attempts     PT Goals (current goals can now be found in the care plan section) Acute Rehab PT Goals Patient Stated Goal: none stated Progress towards PT goals: Progressing toward goals    Frequency    Min 1X/week           Co-evaluation     PT goals addressed during session: Mobility/safety with mobility;Balance        AM-PAC PT "6 Clicks" Mobility   Outcome Measure  Help needed turning from your back to your side while in  a flat bed without using bedrails?: A Lot Help needed  moving from lying on your back to sitting on the side of a flat bed without using bedrails?: A Lot Help needed moving to and from a bed to a chair (including a wheelchair)?: A Lot Help needed standing up from a chair using your arms (e.g., wheelchair or bedside chair)?: A Lot Help needed to walk in hospital room?: A Lot Help needed climbing 3-5 steps with a railing? : Total 6 Click Score: 11    End of Session   Activity Tolerance: Patient tolerated treatment well;Patient limited by fatigue Patient left: in bed;with call bell/phone within reach;with bed alarm set;with family/visitor present Nurse Communication: Mobility status PT Visit Diagnosis: Other abnormalities of gait and mobility (R26.89);Muscle weakness (generalized) (M62.81);Unsteadiness on feet (R26.81)     Time: 1450-1511 PT Time Calculation (min) (ACUTE ONLY): 21 min  Charges:    $Therapeutic Activity: 8-22 mins PT General Charges $$ ACUTE PT VISIT: 1 Visit                     Jetta Lout PTA 11/26/22, 4:31 PM

## 2022-11-26 NOTE — Progress Notes (Signed)
Mobility Specialist - Progress Note   11/26/22 1200  Mobility  Activity Ambulated with assistance in hallway  Level of Assistance Minimal assist, patient does 75% or more  Assistive Device Front wheel walker  Distance Ambulated (ft) 40 ft  Activity Response Tolerated well  $Mobility charge 1 Mobility     Pt sitting in recliner upon arrival, utilizing RA. Pt agreeable to activity. Pt wheeled to hallway for ambulation this date. STS from chair with modA +1. Post lean in standing, requiring cues for anterior weight shifting. Ambulated 71' with minA + chair follow. No LOB. Assist for RW negotiation. Extra time for processing instruction. Fatigued with activity. Pt wheeled back to room and was left in chair with needs in reach, family at bedside.    Cassandra Patterson Mobility Specialist 11/26/22, 12:05 PM

## 2022-11-26 NOTE — Progress Notes (Signed)
Progress Note    Cassandra Patterson  YTK:[id number] DOB: 30-Mar-1928  DOA: 11/09/2022 PCP: Leanna Sato, MD      Brief Narrative:    Medical records reviewed and are as summarized below:  Cassandra Patterson is a 89 y.o. female history of Mobitz type I second-degree heart block, history of TIA, history of hypertension, who presented to the emergency department because of worsening generalized weakness.  Vitals in the ED showed temperature of 98, respiration rate 20, heart rate of 85, blood pressure 99/85, improved to 126/72, SpO2 99% on room air.  Serum sodium is  142, potassium 4.3, chloride 109, bicarb 20, BUN of 54, serum creatinine 1.27, EGFR 40, nonfasting blood glucose 104, WBC 8.6, hemoglobin 9.6, platelets of 239.  Chest x-ray 2 view: Moderate cardiomegaly.  No active lung disease.  Multiple left lateral rib fractures, at least 1 shows periosteal new bone formation.  CT head wo contrast and CT cervical spine wo contrast: Widespread osseous metastatic disease including deposits that erodes through the inner table.  No acute intracranial finding.  No visible brain metastasis.  CT abdomen pelvis w contrast: Multiple lytic bone lesions throughout lumbosacral spine, left ischium, bilateral iliac bones, and ribs.  Pathologic fracture inferior left ischial tuberosity, T12, L5 vertebral bodies.  3.1 cm soft tissue mass of the left lateral body wall.  3.7 cm cystic lesion in the left breast.  Cholelithiasis.  Aortic atherosclerosis.  ED treatment: LR 1 L bolus.      Assessment/Plan:   Principal Problem:   Weakness Active Problems:   AKI (acute kidney injury) (HCC)   Lytic bone lesion of hip   Severe protein-calorie malnutrition (HCC)   Dry skin   Essential hypertension   First degree AV block   Mobitz type 1 second degree AV block   TIA (transient ischemic attack)   History of TIA (transient ischemic attack)   Metastasis to bone (HCC)   Normocytic anemia    Pressure injury of skin   Palliative care encounter   Malignant neoplasm of overlapping sites of left breast in female, estrogen receptor positive (HCC)   Nutrition Problem: Severe Malnutrition Etiology: chronic illness (concern for widespread metastatic disease)  Signs/Symptoms: moderate muscle depletion, severe muscle depletion, moderate fat depletion, severe fat depletion, percent weight loss   Body mass index is 14.53 kg/m.  (Underweight)   Weakness: likely secondary to metastatic cancer. PT/OT recs SNF but pt's insurance denied SNF even after peer to peer. Pt's sister is unable to care for pt by herself at home.  Second appeal has been filed.  Awaiting results   Breast cancer: w/ extensive lytic bone lesions w/ metastatic lesions of lumbosacral spine, left ischium, bilateral iliac bones, ribs, pathologic fractures at the inferior left ischial tuberosity, T12, L5 vertebral bodies.  Large palpable breast mass as per onco. CT abd/pelvis shows 3.1 cm soft tissue mass left lateral body wall, 3.7 cm cystic lesion in the left breast. CT chest shows multiple lytic bone metastases in T spine, ribs, 3.1 cm soft tissue lesion in T9 vertebral body destroys right pedicle w/ tumor extension into the spinal cord with multiple other findings, see CT impression.   Pathology showed metastatic moderately differentiated adenocarcinoma consistent with breast primary. Continue on letrozole as per onco  S/p CT-guided biopsy of left chest wall/breast mass on 11/12/2022.   Poor cognitive function: continue w/ supportive care    AKI: resolved   Anemia of chronic disease: H&H stable.  No  indication for blood transfusion at this time.   Severe protein-calorie malnutrition: continue on nutritional supplements     Hx of TIA: continue on aspirin    HTN: continue on home dose of felodipine    Pressure injury left buttock: stage 3, present on admission. Continue w/ wound care    Partial thickness skin tear to  left heel: w/ foul odor. Continue w/ daily wound care.  Completed 7 days of Augmentin on 11/26/2022.         Diet Order             Diet regular Room service appropriate? Yes; Fluid consistency: Thin  Diet effective now                            Consultants: Oncologist Interventional radiologist  Procedures: CT-guided biopsy of left chest wall/breast on 11/12/2022    Medications:    amoxicillin-clavulanate  1 tablet Oral Q12H   vitamin C  500 mg Oral BID   aspirin EC  81 mg Oral Daily   brimonidine  1 drop Both Eyes BID   calcitonin (salmon)  1 spray Alternating Nares Daily   cholecalciferol  1,000 Units Oral Daily   enoxaparin (LOVENOX) injection  30 mg Subcutaneous QHS   feeding supplement  237 mL Oral TID BM   felodipine  5 mg Oral Daily   latanoprost  1 drop Left Eye QHS   Latanoprostene Bunod  1 drop Ophthalmic QHS   letrozole  2.5 mg Oral Daily   multivitamin with minerals  1 tablet Oral Daily   sodium bicarbonate  650 mg Oral BID   Continuous Infusions:   Anti-infectives (From admission, onward)    Start     Dose/Rate Route Frequency Ordered Stop   11/20/22 1415  amoxicillin-clavulanate (AUGMENTIN) 500-125 MG per tablet 1 tablet        1 tablet Oral Every 12 hours 11/20/22 1322                Family Communication/Anticipated D/C date and plan/Code Status   DVT prophylaxis: enoxaparin (LOVENOX) injection 30 mg Start: 11/09/22 2200 Place TED hose Start: 11/09/22 1441     Code Status: Limited: Do not attempt resuscitation (DNR) -DNR-LIMITED -Do Not Intubate/DNI   Family Communication: Plan discussed with her sister at the bedside Disposition Plan: Plan to discharge to SNF   Status is: Inpatient Remains inpatient appropriate because: Awaiting placement to SNF       Subjective:   Interval events noted.  She has no complaints. Viera, her sister, was at the bedside.  Objective:    Vitals:   11/25/22 1722 11/25/22 1923  11/26/22 0514 11/26/22 0745  BP: (!) 156/73 (!) 160/62 (!) 137/55 (!) 150/69  Pulse: (!) 58 64 (!) 52   Resp: 19 18 18 16   Temp: 98.4 F (36.9 C) 98 F (36.7 C) 98 F (36.7 C) 97.8 F (36.6 C)  TempSrc:   Oral   SpO2: 95% 97% 99% 100%  Weight:      Height:       No data found.   Intake/Output Summary (Last 24 hours) at 11/26/2022 1450 Last data filed at 11/26/2022 0500 Gross per 24 hour  Intake --  Output 400 ml  Net -400 ml   Filed Weights   11/09/22 1053  Weight: 37.2 kg    Exam:  GEN: NAD, sitting up in the chair SKIN: Warm and dry EYES: No pallor or  icterus ENT: MMM CV: RRR PULM: CTA B ABD: soft, ND, NT, +BS CNS: AAO x 2 (person and place), non focal EXT: No edema or tenderness       Data Reviewed:   I have personally reviewed following labs and imaging studies:  Labs: Labs show the following:   Basic Metabolic Panel: Recent Labs  Lab 11/21/22 0456 11/24/22 0502 11/26/22 0407  NA 140 137 137  K 4.9 4.4 4.6  CL 105 102 103  CO2 27 28 27   GLUCOSE 86 86 85  BUN 25* 24* 23  CREATININE 0.81 0.85 0.76  CALCIUM 8.6* 8.9 8.8*  MG 2.2 2.3 2.4   GFR Estimated Creatinine Clearance: 26.4 mL/min (by C-G formula based on SCr of 0.76 mg/dL). Liver Function Tests: No results for input(s): "AST", "ALT", "ALKPHOS", "BILITOT", "PROT", "ALBUMIN" in the last 168 hours. No results for input(s): "LIPASE", "AMYLASE" in the last 168 hours. No results for input(s): "AMMONIA" in the last 168 hours. Coagulation profile No results for input(s): "INR", "PROTIME" in the last 168 hours.  CBC: Recent Labs  Lab 11/21/22 0456 11/24/22 0502 11/26/22 0407  WBC 5.4 5.0 4.7  HGB 9.3* 8.9* 9.5*  HCT 31.8* 30.6* 32.4*  MCV 79.3* 79.1* 77.7*  PLT 286 267 264   Cardiac Enzymes: No results for input(s): "CKTOTAL", "CKMB", "CKMBINDEX", "TROPONINI" in the last 168 hours. BNP (last 3 results) No results for input(s): "PROBNP" in the last 8760 hours. CBG: No results  for input(s): "GLUCAP" in the last 168 hours. D-Dimer: No results for input(s): "DDIMER" in the last 72 hours. Hgb A1c: No results for input(s): "HGBA1C" in the last 72 hours. Lipid Profile: No results for input(s): "CHOL", "HDL", "LDLCALC", "TRIG", "CHOLHDL", "LDLDIRECT" in the last 72 hours. Thyroid function studies: No results for input(s): "TSH", "T4TOTAL", "T3FREE", "THYROIDAB" in the last 72 hours.  Invalid input(s): "FREET3" Anemia work up: No results for input(s): "VITAMINB12", "FOLATE", "FERRITIN", "TIBC", "IRON", "RETICCTPCT" in the last 72 hours. Sepsis Labs: Recent Labs  Lab 11/21/22 0456 11/24/22 0502 11/26/22 0407  WBC 5.4 5.0 4.7    Microbiology No results found for this or any previous visit (from the past 240 hour(s)).  Procedures and diagnostic studies:  No results found.             LOS: 17 days   Areeba Sulser  [employer] loss adjuster on [email or web address]. If 7PM-7AM, please contact night-coverage at [email or web address]     11/26/2022, 2:50 PM

## 2022-11-26 NOTE — Plan of Care (Signed)

## 2022-11-27 DIAGNOSIS — R531 Weakness: Secondary | ICD-10-CM | POA: Diagnosis not present

## 2022-11-27 DIAGNOSIS — Z170 Estrogen receptor positive status [ER+]: Secondary | ICD-10-CM | POA: Diagnosis not present

## 2022-11-27 DIAGNOSIS — C50812 Malignant neoplasm of overlapping sites of left female breast: Secondary | ICD-10-CM | POA: Diagnosis not present

## 2022-11-27 NOTE — Progress Notes (Signed)
Physical Therapy Treatment Patient Details Name: Cassandra Patterson MRN: [id number] DOB: Jul 02, 1928 Today's Date: 11/27/2022   History of Present Illness Pt is a 89 yo female that presented to ED for weakness. PMH of TIA, HTN, mobitz. Imaging showed multiple L rib fractures and further workup showed osseous metastatic disease.    PT Comments  Patient was supine in bed upon arrival, with family member at bedside. Patient seems lethargic at start of session, but agreeable to session. Patient able to stand at bedside with Mod to Max A, multimodal cues required due to frequent difficulty with technique. Continue to demo increased posterior pushing in standing. Patient able to take lateral steps at edge of bed, but refusing additional ambulation due to fear of falling. Assisted with return to supine per patient request despite encouragement of sitting in recliner. Patient will continue to benefit from skilled PT to address impairments and maximize functional mobility. Will continue to follow acutely.    If plan is discharge home, recommend the following: A lot of help with walking and/or transfers;A lot of help with bathing/dressing/bathroom;Assistance with cooking/housework;Direct supervision/assist for medications management;Direct supervision/assist for financial management;Assist for transportation;Help with stairs or ramp for entrance;Supervision due to cognitive status   Can travel by private vehicle     No  Equipment Recommendations  Other (comment) (defer to next level of care)    Recommendations for Other Services       Precautions / Restrictions Precautions Precautions: Fall Precaution Comments: multiple sites of metatasic lesions Restrictions Weight Bearing Restrictions: No     Mobility  Bed Mobility Overal bed mobility: Needs Assistance Bed Mobility: Supine to Sit, Sit to Supine     Supine to sit: Min assist, Contact guard, HOB elevated, Used rails Sit to supine: Mod  assist   General bed mobility comments: increased time for processing; patient seems very lethargic    Transfers Overall transfer level: Needs assistance Equipment used: Rolling walker (2 wheels) Transfers: Sit to/from Stand Sit to Stand: Mod assist, Max assist           General transfer comment: mod-max to stand from bed to RW. Pt has posterior push that improved with multimodal cues. Completed x 2 trials working on hand placement= as patient tends to want to try to place bilateral UE's on RW, despite max cues.    Ambulation/Gait Ambulation/Gait assistance: Min assist Gait Distance (Feet): 4 Feet Assistive device: Rolling walker (2 wheels)   Gait velocity: Decreased     General Gait Details: Posterior push noted with vcs and assistance to fwd wt shift.  assist required with RW, patient completed lateral weight shift x 3-4 steps. Pt noted with increased fear of falling, unwilling to ambulate further at this time.   Stairs             Wheelchair Mobility     Tilt Bed    Modified Rankin (Stroke Patients Only)       Balance Overall balance assessment: Needs assistance Sitting-balance support: Feet supported Sitting balance-Leahy Scale: Poor Sitting balance - Comments: unilateral support on EOB at all times to maintain seated balance   Standing balance support: Bilateral upper extremity supported, During functional activity, Reliant on assistive device for balance Standing balance-Leahy Scale: Poor                              Cognition Arousal: Lethargic Behavior During Therapy: WFL for tasks assessed/performed Overall Cognitive Status: Impaired/Different from baseline  Area of Impairment: Orientation, Following commands, Awareness, Problem solving                 Orientation Level: Disoriented to, Situation, Time   Memory: Decreased short-term memory Following Commands: Follows one step commands inconsistently, Follows one step  commands with increased time Safety/Judgement: Decreased awareness of safety, Decreased awareness of deficits              Exercises      General Comments        Pertinent Vitals/Pain Pain Assessment Pain Assessment: No/denies pain Pain Score: 0-No pain    Home Living                          Prior Function            PT Goals (current goals can now be found in the care plan section) Acute Rehab PT Goals PT Goal Formulation: With family Time For Goal Achievement: 11/27/22 Potential to Achieve Goals: Fair Progress towards PT goals: Progressing toward goals    Frequency    Min 1X/week      PT Plan      Co-evaluation              AM-PAC PT "6 Clicks" Mobility   Outcome Measure  Help needed turning from your back to your side while in a flat bed without using bedrails?: A Lot Help needed moving from lying on your back to sitting on the side of a flat bed without using bedrails?: A Lot Help needed moving to and from a bed to a chair (including a wheelchair)?: A Lot Help needed standing up from a chair using your arms (e.g., wheelchair or bedside chair)?: A Lot Help needed to walk in hospital room?: A Lot Help needed climbing 3-5 steps with a railing? : Total 6 Click Score: 11    End of Session Equipment Utilized During Treatment: Gait belt Activity Tolerance: Patient tolerated treatment well;Patient limited by fatigue Patient left: in bed;with call bell/phone within reach;with bed alarm set;with family/visitor present Nurse Communication: Mobility status PT Visit Diagnosis: Other abnormalities of gait and mobility (R26.89);Muscle weakness (generalized) (M62.81);Unsteadiness on feet (R26.81)     Time: 2956-2130 PT Time Calculation (min) (ACUTE ONLY): 19 min  Charges:    $Therapeutic Activity: 8-22 mins PT General Charges $$ ACUTE PT VISIT: 1 Visit                     Howie Ill, PT, DPT 11/27/22 3:44 PM

## 2022-11-27 NOTE — Progress Notes (Signed)
Progress Note    Cassandra Patterson  LKG:[id number] DOB: [birth year]  DOA: 11/09/2022 PCP: Leanna Sato, MD      Brief Narrative:    Medical records reviewed and are as summarized below:  Cassandra Patterson is a 89 y.o. female history of Mobitz type I second-degree heart block, history of TIA, history of hypertension, who presented to the emergency department because of worsening generalized weakness.  Vitals in the ED showed temperature of 98, respiration rate 20, heart rate of 85, blood pressure 99/85, improved to 126/72, SpO2 99% on room air.  Serum sodium is  142, potassium 4.3, chloride 109, bicarb 20, BUN of 54, serum creatinine 1.27, EGFR 40, nonfasting blood glucose 104, WBC 8.6, hemoglobin 9.6, platelets of 239.  Chest x-ray 2 view: Moderate cardiomegaly.  No active lung disease.  Multiple left lateral rib fractures, at least 1 shows periosteal new bone formation.  CT head wo contrast and CT cervical spine wo contrast: Widespread osseous metastatic disease including deposits that erodes through the inner table.  No acute intracranial finding.  No visible brain metastasis.  CT abdomen pelvis w contrast: Multiple lytic bone lesions throughout lumbosacral spine, left ischium, bilateral iliac bones, and ribs.  Pathologic fracture inferior left ischial tuberosity, T12, L5 vertebral bodies.  3.1 cm soft tissue mass of the left lateral body wall.  3.7 cm cystic lesion in the left breast.  Cholelithiasis.  Aortic atherosclerosis.  ED treatment: LR 1 L bolus.      Assessment/Plan:   Principal Problem:   Weakness Active Problems:   AKI (acute kidney injury) (HCC)   Lytic bone lesion of hip   Severe protein-calorie malnutrition (HCC)   Dry skin   Essential hypertension   First degree AV block   Mobitz type 1 second degree AV block   TIA (transient ischemic attack)   History of TIA (transient ischemic attack)   Metastasis to bone (HCC)   Normocytic anemia    Pressure injury of skin   Palliative care encounter   Malignant neoplasm of overlapping sites of left breast in female, estrogen receptor positive (HCC)   Nutrition Problem: Severe Malnutrition Etiology: chronic illness (concern for widespread metastatic disease)  Signs/Symptoms: moderate muscle depletion, severe muscle depletion, moderate fat depletion, severe fat depletion, percent weight loss   Body mass index is 14.53 kg/m.  (Underweight)   Weakness: likely secondary to metastatic cancer. PT/OT recs SNF but pt's insurance denied SNF even after peer to peer. Pt's sister is unable to care for pt by herself at home.  Second appeal has been filed.  Awaiting results I told her sister to have a plan B in case her appeal is denied.     Breast cancer: w/ extensive lytic bone lesions w/ metastatic lesions of lumbosacral spine, left ischium, bilateral iliac bones, ribs, pathologic fractures at the inferior left ischial tuberosity, T12, L5 vertebral bodies.  Large palpable breast mass as per onco. CT abd/pelvis shows 3.1 cm soft tissue mass left lateral body wall, 3.7 cm cystic lesion in the left breast. CT chest shows multiple lytic bone metastases in T spine, ribs, 3.1 cm soft tissue lesion in T9 vertebral body destroys right pedicle w/ tumor extension into the spinal cord with multiple other findings, see CT impression.   Pathology showed metastatic moderately differentiated adenocarcinoma consistent with breast primary. Continue on letrozole as per onco  S/p CT-guided biopsy of left chest wall/breast mass on 11/12/2022.   Poor cognitive function: continue w/  supportive care    AKI: resolved   Anemia of chronic disease: H&H stable.  No indication for blood transfusion at this time.   Severe protein-calorie malnutrition: continue on nutritional supplements     Hx of TIA: continue on aspirin    HTN: continue on home dose of felodipine    Pressure injury left buttock: stage 3, present on  admission. Continue w/ wound care    Partial thickness skin tear to left heel: w/ foul odor. Continue w/ daily wound care.  Completed 7 days of Augmentin on 11/26/2022.         Diet Order             Diet regular Room service appropriate? Yes; Fluid consistency: Thin  Diet effective now                            Consultants: Oncologist Interventional radiologist  Procedures: CT-guided biopsy of left chest wall/breast on 11/12/2022    Medications:    vitamin C  500 mg Oral BID   aspirin EC  81 mg Oral Daily   brimonidine  1 drop Both Eyes BID   calcitonin (salmon)  1 spray Alternating Nares Daily   cholecalciferol  1,000 Units Oral Daily   enoxaparin (LOVENOX) injection  30 mg Subcutaneous QHS   feeding supplement  237 mL Oral TID BM   felodipine  5 mg Oral Daily   latanoprost  1 drop Left Eye QHS   Latanoprostene Bunod  1 drop Ophthalmic QHS   letrozole  2.5 mg Oral Daily   multivitamin with minerals  1 tablet Oral Daily   sodium bicarbonate  650 mg Oral BID   Continuous Infusions:   Anti-infectives (From admission, onward)    Start     Dose/Rate Route Frequency Ordered Stop   11/26/22 2200  amoxicillin-clavulanate (AUGMENTIN) 500-125 MG per tablet 1 tablet        1 tablet Oral Every 12 hours 11/26/22 1501 11/26/22 2051   11/20/22 1415  amoxicillin-clavulanate (AUGMENTIN) 500-125 MG per tablet 1 tablet  Status:  Discontinued        1 tablet Oral Every 12 hours 11/20/22 1322 11/26/22 1501              Family Communication/Anticipated D/C date and plan/Code Status   DVT prophylaxis: enoxaparin (LOVENOX) injection 30 mg Start: 11/09/22 2200 Place TED hose Start: 11/09/22 1441     Code Status: Limited: Do not attempt resuscitation (DNR) -DNR-LIMITED -Do Not Intubate/DNI   Family Communication: Plan discussed with her sister at the bedside Disposition Plan: Plan to discharge to SNF   Status is: Inpatient Remains inpatient appropriate  because: Awaiting placement to SNF       Subjective:   Interval events noted.  No new complaints.  She feels well. Her 2 sisters,  Frederich Balding and [place], and son-in-law were at the bedside.   Objective:    Vitals:   11/26/22 1828 11/26/22 2059 11/27/22 0549 11/27/22 0801  BP: 132/67 (!) 160/89 133/65 (!) 109/100  Pulse: 99 94 66 69  Resp: 18 19 19 17   Temp: 98.2 F (36.8 C) 98.1 F (36.7 C) 98.2 F (36.8 C) 98.7 F (37.1 C)  TempSrc:      SpO2: 100% 100% 100% 100%  Weight:      Height:       No data found.  No intake or output data in the 24 hours ending 11/27/22 1543  Filed Weights   11/09/22 1053  Weight: 37.2 kg    Exam:  GEN: NAD SKIN: Warm and dry EYES: No pallor or icterus ENT: MMM CV: RRR PULM: CTA B ABD: soft, ND, NT, +BS CNS: AAO x 2 (person and place), non focal EXT: No edema or tenderness      Data Reviewed:   I have personally reviewed following labs and imaging studies:  Labs: Labs show the following:   Basic Metabolic Panel: Recent Labs  Lab 11/21/22 0456 11/24/22 0502 11/26/22 0407  NA 140 137 137  K 4.9 4.4 4.6  CL 105 102 103  CO2 27 28 27   GLUCOSE 86 86 85  BUN 25* 24* 23  CREATININE 0.81 0.85 0.76  CALCIUM 8.6* 8.9 8.8*  MG 2.2 2.3 2.4   GFR Estimated Creatinine Clearance: 26.4 mL/min (by C-G formula based on SCr of 0.76 mg/dL). Liver Function Tests: No results for input(s): "AST", "ALT", "ALKPHOS", "BILITOT", "PROT", "ALBUMIN" in the last 168 hours. No results for input(s): "LIPASE", "AMYLASE" in the last 168 hours. No results for input(s): "AMMONIA" in the last 168 hours. Coagulation profile No results for input(s): "INR", "PROTIME" in the last 168 hours.  CBC: Recent Labs  Lab 11/21/22 0456 11/24/22 0502 11/26/22 0407  WBC 5.4 5.0 4.7  HGB 9.3* 8.9* 9.5*  HCT 31.8* 30.6* 32.4*  MCV 79.3* 79.1* 77.7*  PLT 286 267 264   Cardiac Enzymes: No results for input(s): "CKTOTAL", "CKMB", "CKMBINDEX",  "TROPONINI" in the last 168 hours. BNP (last 3 results) No results for input(s): "PROBNP" in the last 8760 hours. CBG: No results for input(s): "GLUCAP" in the last 168 hours. D-Dimer: No results for input(s): "DDIMER" in the last 72 hours. Hgb A1c: No results for input(s): "HGBA1C" in the last 72 hours. Lipid Profile: No results for input(s): "CHOL", "HDL", "LDLCALC", "TRIG", "CHOLHDL", "LDLDIRECT" in the last 72 hours. Thyroid function studies: No results for input(s): "TSH", "T4TOTAL", "T3FREE", "THYROIDAB" in the last 72 hours.  Invalid input(s): "FREET3" Anemia work up: No results for input(s): "VITAMINB12", "FOLATE", "FERRITIN", "TIBC", "IRON", "RETICCTPCT" in the last 72 hours. Sepsis Labs: Recent Labs  Lab 11/21/22 0456 11/24/22 0502 11/26/22 0407  WBC 5.4 5.0 4.7    Microbiology No results found for this or any previous visit (from the past 240 hour(s)).  Procedures and diagnostic studies:  No results found.             LOS: 18 days   Adasyn Mcadams  [employer] loss adjuster on [email or web address]. If 7PM-7AM, please contact night-coverage at [email or web address]     11/27/2022, 3:43 PM

## 2022-11-27 NOTE — Progress Notes (Signed)
OT Cancellation Note  Patient Details Name: YARENI CREPS MRN: [id number] DOB: 1928/07/30   Cancelled Treatment:    Reason Eval/Treat Not Completed:  OT attempted to see pt prior to lunch, pt was asleep. Family present in room report she had just went to sleep to try again later. OT will re-attempt as time allows/available.   Emeline Darling Chelisa Hennen 11/27/2022, 3:21 PM

## 2022-11-27 NOTE — Plan of Care (Signed)

## 2022-11-28 DIAGNOSIS — R531 Weakness: Secondary | ICD-10-CM | POA: Diagnosis not present

## 2022-11-28 DIAGNOSIS — Z170 Estrogen receptor positive status [ER+]: Secondary | ICD-10-CM | POA: Diagnosis not present

## 2022-11-28 DIAGNOSIS — C50812 Malignant neoplasm of overlapping sites of left female breast: Secondary | ICD-10-CM | POA: Diagnosis not present

## 2022-11-28 LAB — BASIC METABOLIC PANEL
Anion gap: 9 (ref 5–15)
BUN: 25 mg/dL — ABNORMAL HIGH (ref 8–23)
CO2: 28 mmol/L (ref 22–32)
Calcium: 8.9 mg/dL (ref 8.9–10.3)
Chloride: 104 mmol/L (ref 98–111)
Creatinine, Ser: 0.84 mg/dL (ref 0.44–1.00)
GFR, Estimated: >60 mL/min (ref >60)
Glucose, Bld: 84 mg/dL (ref 70–99)
Potassium: 4.4 mmol/L (ref 3.5–5.1)
Sodium: 141 mmol/L (ref 135–145)

## 2022-11-28 LAB — CBC
HCT: 33 % — ABNORMAL LOW (ref 36.0–46.0)
Hemoglobin: 9.7 g/dL — ABNORMAL LOW (ref 12.0–15.0)
MCH: 23.4 pg — ABNORMAL LOW (ref 26.0–34.0)
MCHC: 29.4 g/dL — ABNORMAL LOW (ref 30.0–36.0)
MCV: 79.5 fL — ABNORMAL LOW (ref 80.0–100.0)
Platelets: 267 10*3/uL (ref 150–400)
RBC: 4.15 MIL/uL (ref 3.87–5.11)
RDW: 17.7 % — ABNORMAL HIGH (ref 11.5–15.5)
WBC: 5.5 10*3/uL (ref 4.0–10.5)
nRBC: 0 % (ref 0.0–0.2)

## 2022-11-28 LAB — MAGNESIUM: Magnesium: 2.3 mg/dL (ref 1.7–2.4)

## 2022-11-28 NOTE — Progress Notes (Signed)
Mobility Specialist - Progress Note   11/28/22 1000  Mobility  Activity Ambulated with assistance in room;Transferred to/from [facility];Transferred from bed to chair  Level of Assistance Minimal assist, patient does 75% or more  Assistive Device Front wheel walker  Distance Ambulated (ft) 20 ft  Activity Response Tolerated well  $Mobility charge 1 Mobility     Pt lying in bed upon arrival, utilizing RA. Pt agreeable to activity. Completed bed mobility with modA. STS and ambulation in room with minA. Post lean present upon standing. Pt ambulated to [facility] for urinary output and large BM. TotalA for peri-care d/t need for BUE support. Pt returned to recliner with alarm set, needs in reach. Family at bedside. RN notified.    Filiberto Pinks Mobility Specialist 11/28/22, 10:19 AM

## 2022-11-28 NOTE — TOC Progression Note (Signed)
Transition of Care [facility]) - Progression Note    Patient Details  Name: Cassandra Patterson MRN: [id number] Date of Birth: [birth year]  Transition of Care [facility]) CM/SW Contact  Garret Reddish, RN Phone Number: 11/28/2022, 5:55 PM  Clinical Narrative:     Received notice from [employer] that second level appeal was denied.  Will follow up with patient's sister to discuss discharge options.   Expected Discharge Plan: Skilled Nursing Facility Barriers to Discharge: Continued Medical Work up, SNF Pending bed offer, Insurance Authorization  Expected Discharge Plan and Services                                               Social Determinants of Health (SDOH) Interventions SDOH Screenings   Food Insecurity: Patient Unable To Answer (11/09/2022)  Housing: Patient Unable To Answer (11/09/2022)  Transportation Needs: Patient Unable To Answer (11/09/2022)  Utilities: Patient Unable To Answer (11/09/2022)  Tobacco Use: Low Risk  (11/09/2022)    Readmission Risk Interventions     No data to display

## 2022-11-28 NOTE — Progress Notes (Signed)
Progress Note    Cassandra Patterson  WUJ:[id number] DOB: 1928-04-02  DOA: 11/09/2022 PCP: Leanna Sato, MD      Brief Narrative:    Medical records reviewed and are as summarized below:  Cassandra Patterson is a 89 y.o. female history of Mobitz type I second-degree heart block, history of TIA, history of hypertension, who presented to the emergency department because of worsening generalized weakness.  Vitals in the ED showed temperature of 98, respiration rate 20, heart rate of 85, blood pressure 99/85, improved to 126/72, SpO2 99% on room air.  Serum sodium is  142, potassium 4.3, chloride 109, bicarb 20, BUN of 54, serum creatinine 1.27, EGFR 40, nonfasting blood glucose 104, WBC 8.6, hemoglobin 9.6, platelets of 239.  Chest x-ray 2 view: Moderate cardiomegaly.  No active lung disease.  Multiple left lateral rib fractures, at least 1 shows periosteal new bone formation.  CT head wo contrast and CT cervical spine wo contrast: Widespread osseous metastatic disease including deposits that erodes through the inner table.  No acute intracranial finding.  No visible brain metastasis.  CT abdomen pelvis w contrast: Multiple lytic bone lesions throughout lumbosacral spine, left ischium, bilateral iliac bones, and ribs.  Pathologic fracture inferior left ischial tuberosity, T12, L5 vertebral bodies.  3.1 cm soft tissue mass of the left lateral body wall.  3.7 cm cystic lesion in the left breast.  Cholelithiasis.  Aortic atherosclerosis.  ED treatment: LR 1 L bolus.      Assessment/Plan:   Principal Problem:   Weakness Active Problems:   AKI (acute kidney injury) (HCC)   Lytic bone lesion of hip   Severe protein-calorie malnutrition (HCC)   Dry skin   Essential hypertension   First degree AV block   Mobitz type 1 second degree AV block   TIA (transient ischemic attack)   History of TIA (transient ischemic attack)   Metastasis to bone (HCC)   Normocytic anemia    Pressure injury of skin   Palliative care encounter   Malignant neoplasm of overlapping sites of left breast in female, estrogen receptor positive (HCC)   Nutrition Problem: Severe Malnutrition Etiology: chronic illness (concern for widespread metastatic disease)  Signs/Symptoms: moderate muscle depletion, severe muscle depletion, moderate fat depletion, severe fat depletion, percent weight loss   Body mass index is 14.53 kg/m.  (Underweight)   Weakness: likely secondary to metastatic cancer. PT/OT recs SNF but pt's insurance denied SNF even after peer to peer. Pt's sister is unable to care for pt by herself at home.  Second appeal has been filed.  Awaiting results of appeal.    Breast cancer: w/ extensive lytic bone lesions w/ metastatic lesions of lumbosacral spine, left ischium, bilateral iliac bones, ribs, pathologic fractures at the inferior left ischial tuberosity, T12, L5 vertebral bodies.  Large palpable breast mass as per onco. CT abd/pelvis shows 3.1 cm soft tissue mass left lateral body wall, 3.7 cm cystic lesion in the left breast. CT chest shows multiple lytic bone metastases in T spine, ribs, 3.1 cm soft tissue lesion in T9 vertebral body destroys right pedicle w/ tumor extension into the spinal cord with multiple other findings, see CT impression.   Pathology showed metastatic moderately differentiated adenocarcinoma consistent with breast primary. Continue on letrozole as per onco  S/p CT-guided biopsy of left chest wall/breast mass on 11/12/2022.    Poor cognitive function: continue w/ supportive care     AKI: resolved Metabolic acidosis: Improved.  Discontinue  oral sodium bicarbonate.     Anemia of chronic disease: H&H stable.  No indication for blood transfusion at this time.    Severe protein-calorie malnutrition: continue on nutritional supplements      Hx of TIA: Continue aspirin    Hypertension: Continue felodipine   Intermittent sinus bradycardia,  asymptomatic    Pressure injury left buttock: stage 3, present on admission. Continue w/ wound care     Partial thickness skin tear to left heel: w/ foul odor. Continue w/ daily wound care.  Completed 7 days of Augmentin on 11/26/2022.         Diet Order             Diet regular Room service appropriate? Yes; Fluid consistency: Thin  Diet effective now                            Consultants: Oncologist Interventional radiologist  Procedures: CT-guided biopsy of left chest wall/breast on 11/12/2022    Medications:    vitamin C  500 mg Oral BID   aspirin EC  81 mg Oral Daily   brimonidine  1 drop Both Eyes BID   calcitonin (salmon)  1 spray Alternating Nares Daily   cholecalciferol  1,000 Units Oral Daily   enoxaparin (LOVENOX) injection  30 mg Subcutaneous QHS   feeding supplement  237 mL Oral TID BM   felodipine  5 mg Oral Daily   latanoprost  1 drop Left Eye QHS   Latanoprostene Bunod  1 drop Ophthalmic QHS   letrozole  2.5 mg Oral Daily   multivitamin with minerals  1 tablet Oral Daily   sodium bicarbonate  650 mg Oral BID   Continuous Infusions:   Anti-infectives (From admission, onward)    Start     Dose/Rate Route Frequency Ordered Stop   11/26/22 2200  amoxicillin-clavulanate (AUGMENTIN) 500-125 MG per tablet 1 tablet        1 tablet Oral Every 12 hours 11/26/22 1501 11/26/22 2051   11/20/22 1415  amoxicillin-clavulanate (AUGMENTIN) 500-125 MG per tablet 1 tablet  Status:  Discontinued        1 tablet Oral Every 12 hours 11/20/22 1322 11/26/22 1501              Family Communication/Anticipated D/C date and plan/Code Status   DVT prophylaxis: enoxaparin (LOVENOX) injection 30 mg Start: 11/09/22 2200 Place TED hose Start: 11/09/22 1441     Code Status: Limited: Do not attempt resuscitation (DNR) -DNR-LIMITED -Do Not Intubate/DNI   Family Communication: Plan discussed with her sister at the bedside Disposition Plan: Plan to  discharge to SNF   Status is: Inpatient Remains inpatient appropriate because: Awaiting placement to SNF       Subjective:   Interval events noted.  She has no complaints. Viera, sister, was at the bedside.   Objective:    Vitals:   11/27/22 1649 11/27/22 2035 11/28/22 0511 11/28/22 0747  BP: 137/81 124/82 (!) 147/61 (!) 152/68  Pulse: 90 92 (!) 58 62  Resp: 17 18 18 16   Temp: 98.4 F (36.9 C) 98.2 F (36.8 C) (!) 97.5 F (36.4 C) 98 F (36.7 C)  TempSrc:    Oral  SpO2: 100% 100% 100% 100%  Weight:      Height:       No data found.   Intake/Output Summary (Last 24 hours) at 11/28/2022 1342 Last data filed at 11/27/2022 1600 Gross per 24 hour  Intake --  Output 450 ml  Net -450 ml    Filed Weights   11/09/22 1053  Weight: 37.2 kg    Exam:  GEN: NAD, sitting up in the chair, pleasant  SKIN: Warm and dry EYES: EOMI ENT: MMM CV: RRR PULM: CTA B ABD: soft, ND, NT, +BS CNS: AAO x 2 (person and place), non focal EXT: No edema or tenderness     Data Reviewed:   I have personally reviewed following labs and imaging studies:  Labs: Labs show the following:   Basic Metabolic Panel: Recent Labs  Lab 11/24/22 0502 11/26/22 0407 11/28/22 0412  NA 137 137 141  K 4.4 4.6 4.4  CL 102 103 104  CO2 28 27 28   GLUCOSE 86 85 84  BUN 24* 23 25*  CREATININE 0.85 0.76 0.84  CALCIUM 8.9 8.8* 8.9  MG 2.3 2.4 2.3   GFR Estimated Creatinine Clearance: 25.1 mL/min (by C-G formula based on SCr of 0.84 mg/dL). Liver Function Tests: No results for input(s): "AST", "ALT", "ALKPHOS", "BILITOT", "PROT", "ALBUMIN" in the last 168 hours. No results for input(s): "LIPASE", "AMYLASE" in the last 168 hours. No results for input(s): "AMMONIA" in the last 168 hours. Coagulation profile No results for input(s): "INR", "PROTIME" in the last 168 hours.  CBC: Recent Labs  Lab 11/24/22 0502 11/26/22 0407 11/28/22 0412  WBC 5.0 4.7 5.5  HGB 8.9* 9.5* 9.7*  HCT  30.6* 32.4* 33.0*  MCV 79.1* 77.7* 79.5*  PLT 267 264 267   Cardiac Enzymes: No results for input(s): "CKTOTAL", "CKMB", "CKMBINDEX", "TROPONINI" in the last 168 hours. BNP (last 3 results) No results for input(s): "PROBNP" in the last 8760 hours. CBG: No results for input(s): "GLUCAP" in the last 168 hours. D-Dimer: No results for input(s): "DDIMER" in the last 72 hours. Hgb A1c: No results for input(s): "HGBA1C" in the last 72 hours. Lipid Profile: No results for input(s): "CHOL", "HDL", "LDLCALC", "TRIG", "CHOLHDL", "LDLDIRECT" in the last 72 hours. Thyroid function studies: No results for input(s): "TSH", "T4TOTAL", "T3FREE", "THYROIDAB" in the last 72 hours.  Invalid input(s): "FREET3" Anemia work up: No results for input(s): "VITAMINB12", "FOLATE", "FERRITIN", "TIBC", "IRON", "RETICCTPCT" in the last 72 hours. Sepsis Labs: Recent Labs  Lab 11/24/22 0502 11/26/22 0407 11/28/22 0412  WBC 5.0 4.7 5.5    Microbiology No results found for this or any previous visit (from the past 240 hour(s)).  Procedures and diagnostic studies:  No results found.             LOS: 19 days   Shalon Councilman  [facility] [employer] loss adjuster on [email or web address]. If 7PM-7AM, please contact night-coverage at [email or web address]     11/28/2022, 1:42 PM

## 2022-11-29 DIAGNOSIS — R531 Weakness: Secondary | ICD-10-CM | POA: Diagnosis not present

## 2022-11-29 NOTE — TOC CM/SW Note (Cosign Needed Addendum)
Patient is not able to walk the distance required to go the bathroom, or he/she is unable to safely negotiate stairs required to access the bathroom.  A 3in1 BSC will alleviate this problem     Durable Medical Equipment  (From admission, onward)           Start     Ordered   11/29/22 0000  For home use only DME Hospital bed       Question Answer Comment  Length of Need Lifetime   Bed type Semi-electric      11/29/22 1604   11/29/22 0000  For home use only DME 3 n 1        11/29/22 1604

## 2022-11-29 NOTE — Progress Notes (Addendum)
Progress Note    Cassandra Patterson  ZOX:[id number] DOB: 09-Dec-1928  DOA: 11/09/2022 PCP: Leanna Sato, MD      Brief Narrative:    Medical records reviewed and are as summarized below:  Cassandra Patterson is a 89 y.o. female history of Mobitz type I second-degree heart block, history of TIA, history of hypertension, who presented to the emergency department because of worsening generalized weakness.  Vitals in the ED showed temperature of 98, respiration rate 20, heart rate of 85, blood pressure 99/85, improved to 126/72, SpO2 99% on room air.  Serum sodium is  142, potassium 4.3, chloride 109, bicarb 20, BUN of 54, serum creatinine 1.27, EGFR 40, nonfasting blood glucose 104, WBC 8.6, hemoglobin 9.6, platelets of 239.  Chest x-ray 2 view: Moderate cardiomegaly.  No active lung disease.  Multiple left lateral rib fractures, at least 1 shows periosteal new bone formation.  CT head wo contrast and CT cervical spine wo contrast: Widespread osseous metastatic disease including deposits that erodes through the inner table.  No acute intracranial finding.  No visible brain metastasis.  CT abdomen pelvis w contrast: Multiple lytic bone lesions throughout lumbosacral spine, left ischium, bilateral iliac bones, and ribs.  Pathologic fracture inferior left ischial tuberosity, T12, L5 vertebral bodies.  3.1 cm soft tissue mass of the left lateral body wall.  3.7 cm cystic lesion in the left breast.  Cholelithiasis.  Aortic atherosclerosis.  ED treatment: LR 1 L bolus.      Assessment/Plan:   Principal Problem:   Weakness Active Problems:   AKI (acute kidney injury) (HCC)   Lytic bone lesion of hip   Severe protein-calorie malnutrition (HCC)   Dry skin   Essential hypertension   First degree AV block   Mobitz type 1 second degree AV block   TIA (transient ischemic attack)   History of TIA (transient ischemic attack)   Metastasis to bone (HCC)   Normocytic anemia    Pressure injury of skin   Palliative care encounter   Malignant neoplasm of overlapping sites of left breast in female, estrogen receptor positive (HCC)   Nutrition Problem: Severe Malnutrition Etiology: chronic illness (concern for widespread metastatic disease)  Signs/Symptoms: moderate muscle depletion, severe muscle depletion, moderate fat depletion, severe fat depletion, percent weight loss   Body mass index is 14.53 kg/m.  (Underweight)   Weakness: likely secondary to metastatic cancer. PT/OT recs SNF but pt's insurance denied SNF even after peer to peer. Appeal to rescind insurance decision has been denied.  Patient is medically stable for discharge but there is no safe discharge plan at this time.  Follow-up with transition of care team to assist with disposition.    Breast cancer: w/ extensive lytic bone lesions w/ metastatic lesions of lumbosacral spine, left ischium, bilateral iliac bones, ribs, pathologic fractures at the inferior left ischial tuberosity, T12, L5 vertebral bodies.  Large palpable breast mass as per onco. CT abd/pelvis shows 3.1 cm soft tissue mass left lateral body wall, 3.7 cm cystic lesion in the left breast. CT chest shows multiple lytic bone metastases in T spine, ribs, 3.1 cm soft tissue lesion in T9 vertebral body destroys right pedicle w/ tumor extension into the spinal cord with multiple other findings, see CT impression.   Pathology showed metastatic moderately differentiated adenocarcinoma consistent with breast primary. Continue on letrozole as per onco  S/p CT-guided biopsy of left chest wall/breast mass on 11/12/2022.    Poor cognitive function: continue w/  supportive care     AKI metabolic acidosis: resolved   Anemia of chronic disease: H&H stable.  No indication for blood transfusion at this time.    Severe protein-calorie malnutrition: continue on nutritional supplements      Hx of TIA: Continue aspirin    Hypertension: Continue  felodipine   Intermittent sinus bradycardia, asymptomatic    Pressure injury left buttock: stage 3, present on admission. Continue w/ wound care     Partial thickness skin tear to left heel: w/ foul odor. Continue w/ daily wound care.  Completed 7 days of Augmentin on 11/26/2022.         Diet Order             Diet regular Room service appropriate? Yes; Fluid consistency: Thin  Diet effective now                            Consultants: Oncologist Interventional radiologist  Procedures: CT-guided biopsy of left chest wall/breast on 11/12/2022    Medications:    vitamin C  500 mg Oral BID   aspirin EC  81 mg Oral Daily   brimonidine  1 drop Both Eyes BID   calcitonin (salmon)  1 spray Alternating Nares Daily   cholecalciferol  1,000 Units Oral Daily   enoxaparin (LOVENOX) injection  30 mg Subcutaneous QHS   feeding supplement  237 mL Oral TID BM   felodipine  5 mg Oral Daily   latanoprost  1 drop Left Eye QHS   Latanoprostene Bunod  1 drop Ophthalmic QHS   letrozole  2.5 mg Oral Daily   multivitamin with minerals  1 tablet Oral Daily   Continuous Infusions:   Anti-infectives (From admission, onward)    Start     Dose/Rate Route Frequency Ordered Stop   11/26/22 2200  amoxicillin-clavulanate (AUGMENTIN) 500-125 MG per tablet 1 tablet        1 tablet Oral Every 12 hours 11/26/22 1501 11/26/22 2051   11/20/22 1415  amoxicillin-clavulanate (AUGMENTIN) 500-125 MG per tablet 1 tablet  Status:  Discontinued        1 tablet Oral Every 12 hours 11/20/22 1322 11/26/22 1501              Family Communication/Anticipated D/C date and plan/Code Status   DVT prophylaxis: enoxaparin (LOVENOX) injection 30 mg Start: 11/09/22 2200 Place TED hose Start: 11/09/22 1441     Code Status: Limited: Do not attempt resuscitation (DNR) -DNR-LIMITED -Do Not Intubate/DNI   Family Communication: Plan discussed with Nettie, sister, at the bedside Disposition  Plan: Probably will be discharged home since she did not get insurance approval to go to SNF   Status is: Inpatient Remains inpatient appropriate because: Awaiting placement to SNF       Subjective:   Interval events noted.  She has no complaints.  Nettie, sister, was at the bedside   Objective:    Vitals:   11/28/22 1724 11/28/22 2035 11/29/22 0506 11/29/22 0813  BP: 135/63 135/82 (!) 125/57 (!) 146/63  Pulse: 72 94 (!) 53 88  Resp:  20 18 18   Temp: 98 F (36.7 C) 98.3 F (36.8 C) 98.2 F (36.8 C) 97.9 F (36.6 C)  TempSrc:  Oral Oral Oral  SpO2: 97% 98% 96% 100%  Weight:      Height:       No data found.   Intake/Output Summary (Last 24 hours) at 11/29/2022 1334 Last data filed  at 11/29/2022 1610 Gross per 24 hour  Intake 240 ml  Output 200 ml  Net 40 ml    Filed Weights   11/09/22 1053  Weight: 37.2 kg    Exam:  GEN: NAD SKIN: Warm and dry EYES: No pallor or icterus ENT: MMM CV: RRR PULM: CTA B ABD: soft, ND, NT, +BS CNS: AAO x 1 (person), non focal EXT: No edema or tenderness      Data Reviewed:   I have personally reviewed following labs and imaging studies:  Labs: Labs show the following:   Basic Metabolic Panel: Recent Labs  Lab 11/24/22 0502 11/26/22 0407 11/28/22 0412  NA 137 137 141  K 4.4 4.6 4.4  CL 102 103 104  CO2 28 27 28   GLUCOSE 86 85 84  BUN 24* 23 25*  CREATININE 0.85 0.76 0.84  CALCIUM 8.9 8.8* 8.9  MG 2.3 2.4 2.3   GFR Estimated Creatinine Clearance: 25.1 mL/min (by C-G formula based on SCr of 0.84 mg/dL). Liver Function Tests: No results for input(s): "AST", "ALT", "ALKPHOS", "BILITOT", "PROT", "ALBUMIN" in the last 168 hours. No results for input(s): "LIPASE", "AMYLASE" in the last 168 hours. No results for input(s): "AMMONIA" in the last 168 hours. Coagulation profile No results for input(s): "INR", "PROTIME" in the last 168 hours.  CBC: Recent Labs  Lab 11/24/22 0502 11/26/22 0407  11/28/22 0412  WBC 5.0 4.7 5.5  HGB 8.9* 9.5* 9.7*  HCT 30.6* 32.4* 33.0*  MCV 79.1* 77.7* 79.5*  PLT 267 264 267   Cardiac Enzymes: No results for input(s): "CKTOTAL", "CKMB", "CKMBINDEX", "TROPONINI" in the last 168 hours. BNP (last 3 results) No results for input(s): "PROBNP" in the last 8760 hours. CBG: No results for input(s): "GLUCAP" in the last 168 hours. D-Dimer: No results for input(s): "DDIMER" in the last 72 hours. Hgb A1c: No results for input(s): "HGBA1C" in the last 72 hours. Lipid Profile: No results for input(s): "CHOL", "HDL", "LDLCALC", "TRIG", "CHOLHDL", "LDLDIRECT" in the last 72 hours. Thyroid function studies: No results for input(s): "TSH", "T4TOTAL", "T3FREE", "THYROIDAB" in the last 72 hours.  Invalid input(s): "FREET3" Anemia work up: No results for input(s): "VITAMINB12", "FOLATE", "FERRITIN", "TIBC", "IRON", "RETICCTPCT" in the last 72 hours. Sepsis Labs: Recent Labs  Lab 11/24/22 0502 11/26/22 0407 11/28/22 0412  WBC 5.0 4.7 5.5    Microbiology No results found for this or any previous visit (from the past 240 hour(s)).  Procedures and diagnostic studies:  No results found.             LOS: 20 days   Quavis Klutz  [facility]   Pager on [email or web address]. If 7PM-7AM, please contact night-coverage at [email or web address]     11/29/2022, 1:34 PM

## 2022-11-29 NOTE — TOC Progression Note (Signed)
Transition of Care [facility]) - Progression Note    Patient Details  Name: Cassandra Patterson MRN: [id number] Date of Birth: 11-Dec-1928  [facility]) CM/SW Contact  Liliana Cline, LCSW Phone Number: 11/29/2022, 3:54 PM  Clinical Narrative:    CSW called Judeth Cornfield with [employer] who states she is not able to see outcome of second level appeal as that is another department. Judeth Cornfield states second level appeal is the highest level of appeal available. Judeth Cornfield states there is a note stating the Wellsite geologist of [employer] feels patient is appropriate for home with hospice care.  CSW went to bedside spoke to patient's sister [name] and sister [name] was present over speaker phone.  Notified sisters of [employer] denial.   Patient lives alone at [address] [facility] [address] [place], [address]. Family plans to provide care in the home. [name] states she lives next door and the family will "work it out" to care/provide supervision for patient in the home.   Discussed at length the difference of home health versus hospice.  [name] and [name] stated they had previously spoken with Ree Kida with [facility] and wanted to see what was available for hospice- called Ree Kida who spoke with the sister's and informed them of what is available through hospice in the home (RN, [place], supplies, SW, Orthoptist), frequency is based on initial assessment in home by [employer]. Also discussed HH coverage and that it is usually max of 2-3 times a week for nursing - frequency is based on initial assessment in home from [facility]. Provided Medicare Care Compare list for them to review with [facility] agency options.   They asked about daily wound care - informed them that for both options family would likely have to be taught to do wound care on days [facility] or Hospice RN do not come.   They requested a hospital bed, wheelchair, and 3in1. Asked MD for orders.  The sisters talked amongst themselves and stated they would like to start with home with home  health. They state they may eventually move to hospice but would like to try home health first. They agreed to review Medicare list of agency options and follow up with CSW tomorrow to see their agency of choice.     Expected Discharge Plan: Skilled Nursing Facility Barriers to Discharge: Continued Medical Work up, SNF Pending bed offer, Insurance Authorization  Expected Discharge Plan and Services                                               Social Determinants of Health (SDOH) Interventions SDOH Screenings   Food Insecurity: Patient Unable To Answer (11/09/2022)  Housing: Patient Unable To Answer (11/09/2022)  Transportation Needs: Patient Unable To Answer (11/09/2022)  Utilities: Patient Unable To Answer (11/09/2022)  Tobacco Use: Low Risk  (11/09/2022)    Readmission Risk Interventions     No data to display

## 2022-11-29 NOTE — Plan of Care (Signed)

## 2022-11-30 DIAGNOSIS — Z170 Estrogen receptor positive status [ER+]: Secondary | ICD-10-CM | POA: Diagnosis not present

## 2022-11-30 DIAGNOSIS — C50812 Malignant neoplasm of overlapping sites of left female breast: Secondary | ICD-10-CM | POA: Diagnosis not present

## 2022-11-30 DIAGNOSIS — R531 Weakness: Secondary | ICD-10-CM | POA: Diagnosis not present

## 2022-11-30 NOTE — TOC Progression Note (Signed)
Transition of Care [facility]) - Progression Note    Patient Details  Name: Cassandra Patterson MRN: [id number] Date of Birth: 27-Sep-1928  Transition of Care [facility]) CM/SW Contact  Liliana Cline, LCSW Phone Number: 11/30/2022, 9:02 AM  Clinical Narrative:    [name] with [employer], referral made for hospital bed, 3in1, and wheelchair. [name] states the earliest the hospital bed can be delivered to the home is tomorrow, requested morning delivery if possible.    Expected Discharge Plan: Skilled Nursing Facility Barriers to Discharge: Continued Medical Work up, SNF Pending bed offer, Insurance Authorization  Expected Discharge Plan and Services                                               Social Determinants of Health (SDOH) Interventions SDOH Screenings   Food Insecurity: Patient Unable To Answer (11/09/2022)  Housing: Patient Unable To Answer (11/09/2022)  Transportation Needs: Patient Unable To Answer (11/09/2022)  Utilities: Patient Unable To Answer (11/09/2022)  Tobacco Use: Low Risk  (11/09/2022)    Readmission Risk Interventions     No data to display

## 2022-11-30 NOTE — TOC Progression Note (Signed)
Transition of Care [facility]) - Progression Note    Patient Details  Name: Cassandra Patterson MRN: [id number] Date of Birth: 1928/05/14  Transition of Care [facility]) CM/SW Contact  Liliana Cline, LCSW Phone Number: 11/30/2022, 12:09 PM  Clinical Narrative:    Met with patient's sister [name] at bedside to discuss DC planning, answered her questions.  [name] requests walker be added to [facility] orders - updated [name] and requested [employer] call [name] with delivery time planned for tomorrow.  [name] is aware patient is medically ready for DC and will likely be DC tomorrow after DME is delivered to the home. [name] requests EMS Transport home. Inquired if [name] has chosen a Psychologist, educational - she states she is working on deciding and understands we need to know as soon as possible. [name] inquired about [facility], The RN meeting patient at the home tomorrow post DC. She inquired about knowing the location of the [facility] RN's so they can find one who is nearby to patient's home. CSW explained that HH goal is to come out within 48 hours so they may not come out the same day of DC. Explained we do not have access to knowing the [facility] RN's home locations but that the list provided from Medicare was for the patient's zip code so those agencies do service her area.   Expected Discharge Plan: Skilled Nursing Facility Barriers to Discharge: Continued Medical Work up, SNF Pending bed offer, Insurance Authorization  Expected Discharge Plan and Services                                               Social Determinants of Health (SDOH) Interventions SDOH Screenings   Food Insecurity: Patient Unable To Answer (11/09/2022)  Housing: Patient Unable To Answer (11/09/2022)  Transportation Needs: Patient Unable To Answer (11/09/2022)  Utilities: Patient Unable To Answer (11/09/2022)  Tobacco Use: Low Risk  (11/09/2022)    Readmission Risk Interventions     No data to display

## 2022-11-30 NOTE — TOC Progression Note (Signed)
Transition of Care [facility]) - Progression Note    Patient Details  Name: Cassandra Patterson MRN: [id number] Date of Birth: 28-Nov-1928  Transition of Care [facility]) CM/SW Contact  Liliana Cline, LCSW Phone Number: 11/30/2022, 12:52 PM  Clinical Narrative:    Asked MD to add accessories to wheelchair order (anti tippers, cushion, leg rests, and what ever else is needed).   Expected Discharge Plan: Skilled Nursing Facility Barriers to Discharge: Continued Medical Work up, SNF Pending bed offer, Insurance Authorization  Expected Discharge Plan and Services                                               Social Determinants of Health (SDOH) Interventions SDOH Screenings   Food Insecurity: Patient Unable To Answer (11/09/2022)  Housing: Patient Unable To Answer (11/09/2022)  Transportation Needs: Patient Unable To Answer (11/09/2022)  Utilities: Patient Unable To Answer (11/09/2022)  Tobacco Use: Low Risk  (11/09/2022)    Readmission Risk Interventions     No data to display

## 2022-11-30 NOTE — TOC Progression Note (Addendum)
Transition of Care [facility]) - Progression Note    Patient Details  Name: KATELYNNE REVAK MRN: [id number] Date of Birth: [birth year]  Transition of Care [facility]) CM/SW Contact  Liliana Cline, LCSW Phone Number: 11/30/2022, 2:48 PM  Clinical Narrative:    Received call from [name]. She states they chose [place]. Referral made to [facility] with [facility]. He states start of care will likely be Tuesday. Called [facility] to confirm if EMS was approved or if EMS auth needs to be started, left VM requesting return call from [facility].  Confirmed that CSW requested an early delivery time with [employer] if possible per [name]'s request.    Expected Discharge Plan: Skilled Nursing Facility Barriers to Discharge: Continued Medical Work up, SNF Pending bed offer, English as a second language teacher  Expected Discharge Plan and Services                                               Social Determinants of Health (SDOH) Interventions SDOH Screenings   Food Insecurity: Patient Unable To Answer (11/09/2022)  Housing: Patient Unable To Answer (11/09/2022)  Transportation Needs: Patient Unable To Answer (11/09/2022)  Utilities: Patient Unable To Answer (11/09/2022)  Tobacco Use: Low Risk  (11/09/2022)    Readmission Risk Interventions     No data to display

## 2022-11-30 NOTE — Plan of Care (Signed)

## 2022-11-30 NOTE — Progress Notes (Signed)
Progress Note    Cassandra Patterson  WGN:[id number] DOB: [birth year]  DOA: 11/09/2022 PCP: Leanna Sato, MD      Brief Narrative:    Medical records reviewed and are as summarized below:  Cassandra Patterson is a 89 y.o. female history of Mobitz type I second-degree heart block, history of TIA, history of hypertension, who presented to the emergency department because of worsening generalized weakness.  Vitals in the ED showed temperature of 98, respiration rate 20, heart rate of 85, blood pressure 99/85, improved to 126/72, SpO2 99% on room air.  Serum sodium is  142, potassium 4.3, chloride 109, bicarb 20, BUN of 54, serum creatinine 1.27, EGFR 40, nonfasting blood glucose 104, WBC 8.6, hemoglobin 9.6, platelets of 239.  Chest x-ray 2 view: Moderate cardiomegaly.  No active lung disease.  Multiple left lateral rib fractures, at least 1 shows periosteal new bone formation.  CT head wo contrast and CT cervical spine wo contrast: Widespread osseous metastatic disease including deposits that erodes through the inner table.  No acute intracranial finding.  No visible brain metastasis.  CT abdomen pelvis w contrast: Multiple lytic bone lesions throughout lumbosacral spine, left ischium, bilateral iliac bones, and ribs.  Pathologic fracture inferior left ischial tuberosity, T12, L5 vertebral bodies.  3.1 cm soft tissue mass of the left lateral body wall.  3.7 cm cystic lesion in the left breast.  Cholelithiasis.  Aortic atherosclerosis.  ED treatment: LR 1 L bolus.      Assessment/Plan:   Principal Problem:   Weakness Active Problems:   AKI (acute kidney injury) (HCC)   Lytic bone lesion of hip   Severe protein-calorie malnutrition (HCC)   Dry skin   Essential hypertension   First degree AV block   Mobitz type 1 second degree AV block   TIA (transient ischemic attack)   History of TIA (transient ischemic attack)   Metastasis to bone (HCC)   Normocytic anemia    Pressure injury of skin   Palliative care encounter   Malignant neoplasm of overlapping sites of left breast in female, estrogen receptor positive (HCC)   Nutrition Problem: Severe Malnutrition Etiology: chronic illness (concern for widespread metastatic disease)  Signs/Symptoms: moderate muscle depletion, severe muscle depletion, moderate fat depletion, severe fat depletion, percent weight loss   Body mass index is 14.53 kg/m.  (Underweight)   Weakness: likely secondary to metastatic cancer. PT/OT recs SNF but pt's insurance denied SNF even after peer to peer. Appeal to rescind insurance decision has been denied.  Patient is medically stable for discharge but there is no safe discharge plan at this time.  Family has decided to take patient home.  Awaiting DME (hospital bed, wheelchair and bedside commode) to be delivered to her house prior to discharge. Her sister understands that patient will be discharged home once equipment is delivered.  Anticipated discharge date is tomorrow.   Breast cancer: w/ extensive lytic bone lesions w/ metastatic lesions of lumbosacral spine, left ischium, bilateral iliac bones, ribs, pathologic fractures at the inferior left ischial tuberosity, T12, L5 vertebral bodies.  Large palpable breast mass as per onco. CT abd/pelvis shows 3.1 cm soft tissue mass left lateral body wall, 3.7 cm cystic lesion in the left breast. CT chest shows multiple lytic bone metastases in T spine, ribs, 3.1 cm soft tissue lesion in T9 vertebral body destroys right pedicle w/ tumor extension into the spinal cord with multiple other findings, see CT impression.   Pathology showed  metastatic moderately differentiated adenocarcinoma consistent with breast primary. Continue on letrozole as per onco  S/p CT-guided biopsy of left chest wall/breast mass on 11/12/2022.    Poor cognitive function: continue w/ supportive care     AKI metabolic acidosis: resolved   Anemia of chronic disease:  H&H stable.  No indication for blood transfusion at this time.    Severe protein-calorie malnutrition: continue on nutritional supplements      Hx of TIA: Continue aspirin    Hypertension: Continue felodipine   Intermittent sinus bradycardia, asymptomatic    Pressure injury left buttock: stage 3, present on admission. Continue w/ wound care     Partial thickness skin tear to left heel: Continue w/ daily wound care.  Completed 7 days of Augmentin on 11/26/2022.         Diet Order             Diet regular Room service appropriate? Yes; Fluid consistency: Thin  Diet effective now                            Consultants: Oncologist Interventional radiologist  Procedures: CT-guided biopsy of left chest wall/breast on 11/12/2022    Medications:    vitamin C  500 mg Oral BID   aspirin EC  81 mg Oral Daily   brimonidine  1 drop Both Eyes BID   calcitonin (salmon)  1 spray Alternating Nares Daily   cholecalciferol  1,000 Units Oral Daily   enoxaparin (LOVENOX) injection  30 mg Subcutaneous QHS   feeding supplement  237 mL Oral TID BM   felodipine  5 mg Oral Daily   latanoprost  1 drop Left Eye QHS   Latanoprostene Bunod  1 drop Ophthalmic QHS   letrozole  2.5 mg Oral Daily   multivitamin with minerals  1 tablet Oral Daily   Continuous Infusions:   Anti-infectives (From admission, onward)    Start     Dose/Rate Route Frequency Ordered Stop   11/26/22 2200  amoxicillin-clavulanate (AUGMENTIN) 500-125 MG per tablet 1 tablet        1 tablet Oral Every 12 hours 11/26/22 1501 11/26/22 2051   11/20/22 1415  amoxicillin-clavulanate (AUGMENTIN) 500-125 MG per tablet 1 tablet  Status:  Discontinued        1 tablet Oral Every 12 hours 11/20/22 1322 11/26/22 1501              Family Communication/Anticipated D/C date and plan/Code Status   DVT prophylaxis: enoxaparin (LOVENOX) injection 30 mg Start: 11/09/22 2200 Place TED hose Start: 11/09/22  1441     Code Status: Limited: Do not attempt resuscitation (DNR) -DNR-LIMITED -Do Not Intubate/DNI   Family Communication: Plan discussed with Nettie, sister, at the bedside Disposition Plan: Probably will be discharged home since she did not get insurance approval to go to SNF   Status is: Inpatient Remains inpatient appropriate because: Awaiting placement to SNF       Subjective:   No acute events overnight.  She has no complaints. Viera, sister, was at the bedside  Objective:    Vitals:   11/29/22 1719 11/29/22 2139 11/30/22 0530 11/30/22 0745  BP: 129/62 132/77 (!) 147/73 (!) 142/63  Pulse: 77 69 (!) 57 65  Resp: 16 (!) 24 20 16   Temp: 98.5 F (36.9 C) (!) 97.3 F (36.3 C) 97.8 F (36.6 C) 98 F (36.7 C)  TempSrc: Oral Oral Oral   SpO2: 100% 100% 100%  100%  Weight:      Height:       No data found.   Intake/Output Summary (Last 24 hours) at 11/30/2022 1146 Last data filed at 11/30/2022 0133 Gross per 24 hour  Intake 120 ml  Output 650 ml  Net -530 ml    Filed Weights   11/09/22 1053  Weight: 37.2 kg    Exam:  GEN: NAD SKIN: Warm and dry EYES: EOMI ENT: MMM CV: RRR PULM: CTA B ABD: soft, ND, NT, +BS CNS: AAO x 2 (person and place) , non focal EXT: No edema or tenderness     Data Reviewed:   I have personally reviewed following labs and imaging studies:  Labs: Labs show the following:   Basic Metabolic Panel: Recent Labs  Lab 11/24/22 0502 11/26/22 0407 11/28/22 0412  NA 137 137 141  K 4.4 4.6 4.4  CL 102 103 104  CO2 28 27 28   GLUCOSE 86 85 84  BUN 24* 23 25*  CREATININE 0.85 0.76 0.84  CALCIUM 8.9 8.8* 8.9  MG 2.3 2.4 2.3   GFR Estimated Creatinine Clearance: 25.1 mL/min (by C-G formula based on SCr of 0.84 mg/dL). Liver Function Tests: No results for input(s): "AST", "ALT", "ALKPHOS", "BILITOT", "PROT", "ALBUMIN" in the last 168 hours. No results for input(s): "LIPASE", "AMYLASE" in the last 168 hours. No results  for input(s): "AMMONIA" in the last 168 hours. Coagulation profile No results for input(s): "INR", "PROTIME" in the last 168 hours.  CBC: Recent Labs  Lab 11/24/22 0502 11/26/22 0407 11/28/22 0412  WBC 5.0 4.7 5.5  HGB 8.9* 9.5* 9.7*  HCT 30.6* 32.4* 33.0*  MCV 79.1* 77.7* 79.5*  PLT 267 264 267   Cardiac Enzymes: No results for input(s): "CKTOTAL", "CKMB", "CKMBINDEX", "TROPONINI" in the last 168 hours. BNP (last 3 results) No results for input(s): "PROBNP" in the last 8760 hours. CBG: No results for input(s): "GLUCAP" in the last 168 hours. D-Dimer: No results for input(s): "DDIMER" in the last 72 hours. Hgb A1c: No results for input(s): "HGBA1C" in the last 72 hours. Lipid Profile: No results for input(s): "CHOL", "HDL", "LDLCALC", "TRIG", "CHOLHDL", "LDLDIRECT" in the last 72 hours. Thyroid function studies: No results for input(s): "TSH", "T4TOTAL", "T3FREE", "THYROIDAB" in the last 72 hours.  Invalid input(s): "FREET3" Anemia work up: No results for input(s): "VITAMINB12", "FOLATE", "FERRITIN", "TIBC", "IRON", "RETICCTPCT" in the last 72 hours. Sepsis Labs: Recent Labs  Lab 11/24/22 0502 11/26/22 0407 11/28/22 0412  WBC 5.0 4.7 5.5    Microbiology No results found for this or any previous visit (from the past 240 hour(s)).  Procedures and diagnostic studies:  No results found.             LOS: 21 days   Hiroto Saltzman  [employer] loss adjuster on [email or web address]. If 7PM-7AM, please contact night-coverage at [email or web address]     11/30/2022, 11:46 AM

## 2022-12-01 DIAGNOSIS — R531 Weakness: Secondary | ICD-10-CM | POA: Diagnosis not present

## 2022-12-01 DIAGNOSIS — C50812 Malignant neoplasm of overlapping sites of left female breast: Secondary | ICD-10-CM | POA: Diagnosis not present

## 2022-12-01 DIAGNOSIS — Z170 Estrogen receptor positive status [ER+]: Secondary | ICD-10-CM | POA: Diagnosis not present

## 2022-12-01 MED ORDER — LETROZOLE 2.5 MG PO TABS: 2.5000 mg | ORAL_TABLET | Freq: Every day | ORAL | 0 refills | Status: DC

## 2022-12-01 NOTE — Discharge Summary (Incomplete)
Physician Discharge Summary   Patient: Cassandra Patterson MRN: [id number] DOB: [birth year]  Admit date:     11/09/2022  Discharge date: {dischdate:26783}  Discharge Physician: Lurene Shadow   PCP: Cassandra Sato, MD   Recommendations at discharge:  {Tip this will not be part of the note when signed- Example include specific recommendations for outpatient follow-up, pending tests to follow-up on. (Optional):26781}  ***  Discharge Diagnoses: Principal Problem:   Weakness Active Problems:   AKI (acute kidney injury) (HCC)   Lytic bone lesion of hip   Severe protein-calorie malnutrition (HCC)   Dry skin   Essential hypertension   First degree AV block   Mobitz type 1 second degree AV block   TIA (transient ischemic attack)   History of TIA (transient ischemic attack)   Metastasis to bone (HCC)   Normocytic anemia   Pressure injury of skin   Palliative care encounter   Malignant neoplasm of overlapping sites of left breast in female, estrogen receptor positive (HCC)  Resolved Problems:   * No resolved hospital problems. [facility] Course: Cassandra Patterson is a 89 year old female with history of Mobitz type I second-degree heart block, history of TIA, history of hypertension, who presents to the emergency department for chief concerns of worsening generalized weakness.  Vitals in the ED showed temperature of 98, respiration rate 20, heart rate of 85, blood pressure 99/85, improved to 126/72, SpO2 99% on room air.  Serum sodium is  142, potassium 4.3, chloride 109, bicarb 20, BUN of 54, serum creatinine 1.27, EGFR 40, nonfasting blood glucose 104, WBC 8.6, hemoglobin 9.6, platelets of 239.  Chest x-ray 2 view: Moderate cardiomegaly.  No active lung disease.  Multiple left lateral rib fractures, at least 1 shows periosteal new bone formation.  CT head wo contrast and CT cervical spine wo contrast: Widespread osseous metastatic disease including deposits that erodes through  the inner table.  No acute intracranial finding.  No visible brain metastasis.  CT abdomen pelvis w contrast: Multiple lytic bone lesions throughout lumbosacral spine, left ischium, bilateral iliac bones, and ribs.  Pathologic fracture inferior left ischial tuberosity, T12, L5 vertebral bodies.  3.1 cm soft tissue mass of the left lateral body wall.  3.7 cm cystic lesion in the left breast.  Cholelithiasis.  Aortic atherosclerosis.  ED treatment: LR 1 L bolus.  Assessment and Plan: * Weakness Suspect secondary to widespread metastatic disease Primary carcinoma unclear at this time Symptomatic support: Sodium chloride infusion at 100 mL/h, 2 days ordered Registered dietitian has been consulted  Lytic bone lesion of hip Suspect metastatic bone lesions, widespread including lumbosacral spine, left ischium, bilateral iliac bones, ribs, pathologic fractures at the inferior left ischial tuberosity, T12, L5 vertebral bodies Patient and family would like to speak with medical oncology to understand what their options are and if there are treatments available Medical oncology has been consulted to Dr. Cathie Hoops via secure chat, staff message, and Epic order  AKI (acute kidney injury) (HCC) Secondary to poor p.o. intake Baseline serum creatinine is 0.79-0.81/eGFR greater than 60 Status post LR 1 L bolus per EDP Continue with sodium chloride infusion at 100 mL/h, 2 days ordered Recheck BMP in a.m.  Severe protein-calorie malnutrition [facility]) Registered dietitian has been consulted  Dry skin With skin tears, irritation consistent with chronic scratching Lubriderm lotion twice daily, 10 days ordered  History of TIA (transient ischemic attack) Aspirin 81 mg daily resumed  Essential hypertension Home felodipine 5 mg daily resumed Hydralazine  5 mg IV every 8 hours as needed for SBP > 165, 5 days ordered      {Tip this will not be part of the note when signed Body mass index is 14.53 kg/m. ,   Nutrition Documentation    Flowsheet Row ED to Hosp-Admission (Current) from 11/09/2022 in [facility] 1C MEDICAL TELEMETRY  Nutrition Problem Severe Malnutrition  Etiology chronic illness  [concern for widespread metastatic disease]  Nutrition Goal Patient will meet greater than or equal to 90% of their needs  Interventions Ensure Enlive (each supplement provides 350kcal and 20 grams of protein), MVI, Magic cup     ,  Active Pressure Injury/Wound(s)     Pressure Ulcer  Duration          Pressure Injury 11/09/22 Buttocks Left Stage 3 -  Full thickness tissue loss. Subcutaneous fat may be visible but bone, tendon or muscle are NOT exposed. red and yellow 21 days   Pressure Injury 11/09/22 Sacrum Mid Stage 3 -  Full thickness tissue loss. Subcutaneous fat may be visible but bone, tendon or muscle are NOT exposed. red and yellow 21 days           (Optional):26781}  {(NOTE) Pain control PDMP Statment (Optional):26782} Consultants: *** Procedures performed: ***  Disposition: {Plan; Disposition:26390} Diet recommendation:  Discharge Diet Orders (From admission, onward)     Start     Ordered   12/01/22 0000  Diet - low sodium heart healthy        12/01/22 1415           {Diet_Plan:26776} DISCHARGE MEDICATION: Allergies as of 12/01/2022   No Known Allergies      Medication List     STOP taking these medications    COD LIVER OIL PO       TAKE these medications    acetaminophen 650 MG CR tablet Commonly known as: TYLENOL Take 650 mg by mouth every 8 (eight) hours as needed for pain.   ammonium lactate 12 % cream Commonly known as: AMLACTIN Apply topically as needed.   aspirin EC 81 MG tablet Take 81 mg by mouth daily. Swallow whole.   brimonidine 0.15 % ophthalmic solution Commonly known as: ALPHAGAN Place 1 drop into both eyes 2 (two) times daily.   calcitonin (salmon) 200 UNIT/ACT nasal spray Commonly known as:  MIACALCIN/FORTICAL Place 1 spray into alternate nostrils daily.   felodipine 5 MG 24 hr tablet Commonly known as: PLENDIL TAKE 1 TABLET(5 MG) BY MOUTH EVERY DAY   letrozole 2.5 MG tablet Commonly known as: FEMARA Take 1 tablet (2.5 mg total) by mouth daily. Start taking on: December 02, 2022   Lumigan 0.01 % Soln Generic drug: bimatoprost Place 1 drop into the left eye at bedtime.   ONE-A-DAY WOMENS PO Take 1 tablet by mouth daily.   Vitamin D-3 25 MCG (1000 UT) Caps Take by mouth daily.   VYZULTA OP Apply 1 drop to eye at bedtime. Right eye               Durable Medical Equipment  (From admission, onward)           Start     Ordered   11/29/22 0000  For home use only DME Hospital bed       Question Answer Comment  Length of Need Lifetime   Bed type Semi-electric      11/29/22 1604   11/29/22 0000  For home use only DME 3 n 1  11/29/22 1604              Discharge Care Instructions  (From admission, onward)           Start     Ordered   12/01/22 0000  Discharge wound care:       Comments: Cleanse L heel wound with Vashe wound cleanser and then apply Vashe moistened gauze to wound bed 2 times daily, cover with dry gauze and secure with Kerlix roll gauze.   12/01/22 1415            Discharge Exam: Filed Weights   11/09/22 1053  Weight: 37.2 kg   ***  Condition at discharge: {DC Condition:26389}  The results of significant diagnostics from this hospitalization (including imaging, microbiology, ancillary and laboratory) are listed below for reference.   Imaging Studies: CT ASPIRATION N/S  Result Date: 11/12/2022 INDICATION: 89 year old female referred for biopsy L5 vertebral body, with inability to position on left decubitus or prone. We will proceed after speaking with oncology team in the patient's family with a left chest wall/breast mass biopsy EXAM: CT BIOPSY PUNCTURE ASPIRATION SUPERFICIAL FLUID MEDICATIONS: None.  ANESTHESIA/SEDATION: Moderate (conscious) sedation was not employed during this procedure. A total of Versed 0.5 mg and Fentanyl 0 mcg was administered intravenously. Moderate Sedation Time: 0 minutes. The patient's level of consciousness and vital signs were monitored continuously by radiology nursing throughout the procedure under my direct supervision. FLUOROSCOPY TIME:  CT COMPLICATIONS: None PROCEDURE: Informed written consent was obtained from the patient after a thorough discussion of the procedural risks, benefits and alternatives. All questions were addressed. Maximal Sterile Barrier Technique was utilized including caps, mask, sterile gowns, sterile gloves, sterile drape, hand hygiene and skin antiseptic. A timeout was performed prior to the initiation of the procedure. Patient was positioned supine on the CT gantry table. Scout CT of the chest performed for planning purposes. The patient is prepped and draped in the usual sterile fashion. 1% lidocaine was used for local anesthesia. Using CT guidance, guide needle was advanced into the solid mass of the left chest wall/breast. Once we confirmed needle tip position multiple 18 gauge core biopsy were acquired. Needle was removed. Final CT was acquired. Manual pressure was used for hemostasis. Sterile bandage was placed. Patient tolerated the procedure well and remained hemodynamically stable throughout. No complications were encountered and no significant blood loss. IMPRESSION: Status post CT-guided biopsy of left chest wall mass/breast mass. Signed, Yvone Neu. Miachel Roux, RPVI [facility] Radiology Electronically Signed   By: Gilmer Mor D.O.   On: 11/12/2022 16:07   MR THORACIC SPINE W WO CONTRAST  Result Date: 11/11/2022 CLINICAL DATA:  Metastatic disease evaluation.  Weakness. EXAM: MRI THORACIC WITHOUT AND WITH CONTRAST TECHNIQUE: Multiplanar and multiecho pulse sequences of the thoracic spine  were obtained without and with intravenous contrast. CONTRAST:  4mL GADAVIST GADOBUTROL 1 MMOL/ML IV SOLN COMPARISON:  CT chest 11/10/2022 FINDINGS: Multiple sequences are moderately motion degraded. Alignment: Increased thoracic kyphosis. Thoracic dextroscoliosis. No significant listhesis. Vertebrae: Widespread enhancing lesions throughout the thoracic spine as well as the cervical and included upper lumbar spine. Large destructive lesion involving the right aspect of the T9 vertebral body and pedicle as seen on CT with right-sided ventral and lateral epidural tumor not resulting in significant spinal stenosis or spinal cord mass effect. Small volume left-sided ventral epidural tumor at T2 without stenosis. T6 and T8 compression fractures with 50% anterior vertebral body height loss. Multiple rib  lesions and rib fractures, better demonstrated on the prior CT. Cord:  Normal signal and morphology. Paraspinal and other soft tissues: Small bilateral pleural effusions. Disc levels: Mild thoracic spondylosis. No spinal stenosis. Moderate right neural foraminal encroachment at T9-10 due to tumor. IMPRESSION: 1. Widespread osseous metastases. 2. Epidural tumor at T2 and T9 without significant spinal stenosis. 3. T6 and T8 compression fractures. Electronically Signed   By: Sebastian Ache M.D.   On: 11/11/2022 18:34   CT CHEST WO CONTRAST  Result Date: 11/11/2022 CLINICAL DATA:  Breast cancer staging.  * Tracking Code: BO * EXAM: CT CHEST WITHOUT CONTRAST TECHNIQUE: Multidetector CT imaging of the chest was performed following the standard protocol without IV contrast. RADIATION DOSE REDUCTION: This exam was performed according to the departmental dose-optimization program which includes automated exposure control, adjustment of the mA and/or kV according to patient size and/or use of iterative reconstruction technique. COMPARISON:  None Available. FINDINGS: Cardiovascular: The heart is markedly enlarged. No substantial  pericardial effusion. Ascending thoracic aorta measures on the order of 4.2 cm diameter. Descending thoracic aorta measures 3.8 cm diameter. Enlargement of the pulmonary outflow tract/main pulmonary arteries suggests pulmonary arterial hypertension. There is moderate atherosclerotic calcification of the abdominal aorta without aneurysm. Mediastinum/Nodes: 1.6 cm nodule identified right thyroid lobe. Upper normal mediastinal lymph nodes evident although assessment limited by lack of mediastinal fat and lack of intravenous contrast material. No evidence for gross hilar lymphadenopathy although assessment is limited by the lack of intravenous contrast on the current study. The esophagus has normal imaging features. No right axillary lymphadenopathy. Left axilla incompletely visualized. 10 mm short axis left subpectoral lymph node seen on image 49/2 with probable greater than 18 mm short axis left axillary node on 76/2, incompletely visualized. Left breast is been incompletely visualized with greater than 3 cm homogeneous well-defined incompletely visualized structure noted on image 110/2. Lungs/Pleura: Centrilobular emphsyema noted. Perifissural nodule measuring 4 mm noted right lung on 60/4 with additional 4-5 mm nodules in the right lung on images 68 and 89 of series 4. There is dependent collapse/consolidation in both lower lungs with small bilateral pleural effusions. Upper Abdomen: Unremarkable on noncontrast imaging. Musculoskeletal: Multiple lytic bone metastases evident in the thoracic spine and ribs including posterior right second rib on image 3/series 2. Metastatic lesion in the posterior T2 vertebral body erodes the posterior cortex. 3.1 cm soft tissue lesion in the T9 vertebral body destroys the right pedicle and posterior cortex of the vertebral body with tumor extension into the spinal canal (see image 68/2). Chronic fracture nonunion noted in the right clavicle pathologic fracture noted posterior right  ninth and tenth ribs as well as the posterior left twelfth in tenth ribs. Multiple nonacute left-sided rib fractures evident. IMPRESSION: 1. Multiple lytic bone metastases in the thoracic spine and ribs. 3.1 cm soft tissue lesion in the T9 vertebral body destroys the right pedicle and posterior cortex of the vertebral body with tumor extension into the spinal canal. MRI of the thoracic spine with and without contrast recommended to further evaluate. 2. Metastatic lesion in the T2 vertebral body destroys the posterior cortex on sagittal imaging suggests extension in the anterior spinal canal. 3. Pathologic fractures of the posterior right ninth and tenth ribs as well as the posterior left twelfth and tenth ribs. Innumerable thoracic spine and rib metastases evident. 4. Left axillary and subpectoral lymphadenopathy, incompletely visualized. 5. 1.6 cm right thyroid lobe nodule. In the setting of significant comorbidities or limited life expectancy, no follow-up  recommended (ref: J Am Coll Radiol. 2015 Feb;12(2): 143-50). 6. Small bilateral pleural effusions with dependent collapse/consolidation in both lower lungs. 7. Tiny right lung nodules.  Metastatic disease not excluded. 8. Aortic Atherosclerosis (ICD10-I70.0) and Emphysema (ICD10-J43.9). Electronically Signed   By: Kennith Center M.D.   On: 11/11/2022 10:29   CT ABDOMEN PELVIS W CONTRAST  Result Date: 11/09/2022 CLINICAL DATA:  Acute abdominal pain EXAM: CT ABDOMEN AND PELVIS WITH CONTRAST TECHNIQUE: Multidetector CT imaging of the abdomen and pelvis was performed using the standard protocol following bolus administration of intravenous contrast. RADIATION DOSE REDUCTION: This exam was performed according to the departmental dose-optimization program which includes automated exposure control, adjustment of the mA and/or kV according to patient size and/or use of iterative reconstruction technique. CONTRAST:  75mL OMNIPAQUE IOHEXOL 300 MG/ML  SOLN COMPARISON:   None Available. FINDINGS: Lower chest: T9 vertebral body lytic metastasis. Multiple lytic rib metastases. 3.1 cm soft tissue mass left lateral body wall. No pleural or pericardial effusion. Dependent atelectasis in the lung bases. 3.7 cm cystic lesion in the left breast. Hepatobiliary: Subcentimeter calcified stone in the dependent aspect of the nondilated gallbladder. No focal liver lesion or biliary ductal dilatation. Pancreas: Unremarkable. No pancreatic ductal dilatation or surrounding inflammatory changes. Spleen: Normal in size without focal abnormality. Adrenals/Urinary Tract: No adrenal mass. Symmetric renal parenchymal enhancement without focal lesion or evident urolithiasis. Urinary bladder nondistended. Stomach/Bowel: Stomach decompressed. Small bowel nondilated. Normal appendix. The colon is partially distended, without acute finding. Vascular/Lymphatic: Moderate scattered calcified aortoiliac atheromatous plaque without aneurysm or stenosis. Portal vein patent. No abdominal or pelvic adenopathy. Reproductive: Status post hysterectomy. No adnexal masses. Other: No ascites.  No free air. Musculoskeletal: Multiple lytic bone lesions throughout the lumbosacral spine, left ischium, bilateral iliac bones. Lytic lesion in the inferior left ischial tuberosity with pathologic fracture, minimally displaced. Pathologic compression deformity of L5 with a greater than 50% loss of height. Mild compression deformity of T12. fixation hardware in the posterior left acetabulum. IMPRESSION: 1. Multiple lytic bone lesions throughout the lumbosacral spine, left ischium, bilateral iliac bones, and ribs. Pathologic fractures of the inferior left ischial tuberosity, T12 and L5 vertebral bodies. 2. 3.1 cm soft tissue mass left lateral body wall. 3. 3.7 cm cystic lesion in the left breast. 4. Cholelithiasis. 5.  Aortic Atherosclerosis (ICD10-I70.0). Electronically Signed   By: Corlis Leak M.D.   On: 11/09/2022 13:53   CT Head  Wo Contrast  Result Date: 11/09/2022 CLINICAL DATA:  Mental status change with unknown cause. Neck trauma EXAM: CT HEAD WITHOUT CONTRAST CT CERVICAL SPINE WITHOUT CONTRAST TECHNIQUE: Multidetector CT imaging of the head and cervical spine was performed following the standard protocol without intravenous contrast. Multiplanar CT image reconstructions of the cervical spine were also generated. RADIATION DOSE REDUCTION: This exam was performed according to the departmental dose-optimization program which includes automated exposure control, adjustment of the mA and/or kV according to patient size and/or use of iterative reconstruction technique. COMPARISON:  Brain MRI 01/07/2021 FINDINGS: CT HEAD FINDINGS Brain: No evidence of acute infarction, hemorrhage, hydrocephalus, extra-axial collection or mass lesion/mass effect. Small right cerebellar infarct, chronic by prior MRI. Generalized brain atrophy. Vascular: No hyperdense vessel or unexpected calcification. Skull: Multiple lytic lesions scattered throughout the calvarium. Parasagittal right frontal and parasagittal left parietal deposits erode the inner table. Additional notable lesion in the left sphenoid wing eroding the inner table. Sinuses/Orbits: No acute finding CT CERVICAL SPINE FINDINGS Alignment: Normal Skull base and vertebrae: No acute fracture. Multiple lytic bone  lesions seen in the C3, C6, C7, T1, T2, and T3 bodies. Notable posterior element deposits at T1-T4. Partially covered nonacute right clavicle fracture with bulky callus that could obscure underlying lesion. Soft tissues and spinal canal: No prevertebral fluid or swelling. No visible canal hematoma. Disc levels:  Ordinary degenerative spurring. Upper chest: Sizable metastatic deposit at the right second rib posteriorly. IMPRESSION: Widespread osseous metastatic disease including deposits that erodes through the inner table. No acute intracranial finding.  No visible brain metastasis.  Electronically Signed   By: Tiburcio Pea M.D.   On: 11/09/2022 11:45   CT Cervical Spine Wo Contrast  Result Date: 11/09/2022 CLINICAL DATA:  Mental status change with unknown cause. Neck trauma EXAM: CT HEAD WITHOUT CONTRAST CT CERVICAL SPINE WITHOUT CONTRAST TECHNIQUE: Multidetector CT imaging of the head and cervical spine was performed following the standard protocol without intravenous contrast. Multiplanar CT image reconstructions of the cervical spine were also generated. RADIATION DOSE REDUCTION: This exam was performed according to the departmental dose-optimization program which includes automated exposure control, adjustment of the mA and/or kV according to patient size and/or use of iterative reconstruction technique. COMPARISON:  Brain MRI 01/07/2021 FINDINGS: CT HEAD FINDINGS Brain: No evidence of acute infarction, hemorrhage, hydrocephalus, extra-axial collection or mass lesion/mass effect. Small right cerebellar infarct, chronic by prior MRI. Generalized brain atrophy. Vascular: No hyperdense vessel or unexpected calcification. Skull: Multiple lytic lesions scattered throughout the calvarium. Parasagittal right frontal and parasagittal left parietal deposits erode the inner table. Additional notable lesion in the left sphenoid wing eroding the inner table. Sinuses/Orbits: No acute finding CT CERVICAL SPINE FINDINGS Alignment: Normal Skull base and vertebrae: No acute fracture. Multiple lytic bone lesions seen in the C3, C6, C7, T1, T2, and T3 bodies. Notable posterior element deposits at T1-T4. Partially covered nonacute right clavicle fracture with bulky callus that could obscure underlying lesion. Soft tissues and spinal canal: No prevertebral fluid or swelling. No visible canal hematoma. Disc levels:  Ordinary degenerative spurring. Upper chest: Sizable metastatic deposit at the right second rib posteriorly. IMPRESSION: Widespread osseous metastatic disease including deposits that erodes  through the inner table. No acute intracranial finding.  No visible brain metastasis. Electronically Signed   By: Tiburcio Pea M.D.   On: 11/09/2022 11:45   DG Chest 2 View  Result Date: 11/09/2022 CLINICAL DATA:  Chest pain.  Back and foot wounds. EXAM: CHEST - 2 VIEW COMPARISON:  01/06/2021 FINDINGS: Moderate cardiomegaly again noted. Ectasia and atherosclerotic calcification of the thoracic aorta again seen. Both lungs remain clear. No evidence of pneumothorax or pleural effusion. Several displaced left lateral rib fracture deformities are seen several displaced rib fractures are seen involving the left lateral 4th through 9th ribs, at least 1 of which shows periosteal new bone formation. IMPRESSION: Moderate cardiomegaly. No active lung disease. Multiple left lateral rib fractures, at least 1 of which shows periosteal new bone formation. Electronically Signed   By: Danae Orleans M.D.   On: 11/09/2022 11:16    Microbiology: Results for orders placed or performed during the hospital encounter of 01/06/21  Resp Panel by RT-PCR (Flu A&B, Covid) Nasopharyngeal Swab     Status: None   Collection Time: 01/06/21  8:23 PM   Specimen: Nasopharyngeal Swab; Nasopharyngeal(NP) swabs in vial transport medium  Result Value Ref Range Status   SARS Coronavirus 2 by RT PCR NEGATIVE NEGATIVE Final    Comment: (NOTE) SARS-CoV-2 target nucleic acids are NOT DETECTED.  The SARS-CoV-2 RNA is generally detectable in  upper respiratory specimens during the acute phase of infection. The lowest concentration of SARS-CoV-2 viral copies this assay can detect is 138 copies/mL. A negative result does not preclude SARS-Cov-2 infection and should not be used as the sole basis for treatment or other patient management decisions. A negative result may occur with  improper specimen collection/handling, submission of specimen other than nasopharyngeal swab, presence of viral mutation(s) within the areas targeted by this  assay, and inadequate number of viral copies(<138 copies/mL). A negative result must be combined with clinical observations, patient history, and epidemiological information. The expected result is Negative.  Fact Sheet for Patients:  BloggerCourse.com  Fact Sheet for Healthcare Providers:  SeriousBroker.it  This test is no t yet approved or cleared by the [place] FDA and  has been authorized for detection and/or diagnosis of SARS-CoV-2 by FDA under an Emergency Use Authorization (EUA). This EUA will remain  in effect (meaning this test can be used) for the duration of the COVID-19 declaration under Section 564(b)(1) of the Act, 21 U.S.C.section 360bbb-3(b)(1), unless the authorization is terminated  or revoked sooner.       Influenza A by PCR NEGATIVE NEGATIVE Final   Influenza B by PCR NEGATIVE NEGATIVE Final    Comment: (NOTE) The Xpert Xpress SARS-CoV-2/FLU/RSV plus assay is intended as an aid in the diagnosis of influenza from Nasopharyngeal swab specimens and should not be used as a sole basis for treatment. Nasal washings and aspirates are unacceptable for Xpert Xpress SARS-CoV-2/FLU/RSV testing.  Fact Sheet for Patients: BloggerCourse.com  Fact Sheet for Healthcare Providers: SeriousBroker.it  This test is not yet approved or cleared by the [place] FDA and has been authorized for detection and/or diagnosis of SARS-CoV-2 by FDA under an Emergency Use Authorization (EUA). This EUA will remain in effect (meaning this test can be used) for the duration of the COVID-19 declaration under Section 564(b)(1) of the Act, 21 U.S.C. section 360bbb-3(b)(1), unless the authorization is terminated or revoked.  Performed at [facility], [address] [place], [place] [address]     Labs: CBC: Recent Labs  Lab 11/26/22 0407 11/28/22 0412  WBC 4.7 5.5   HGB 9.5* 9.7*  HCT 32.4* 33.0*  MCV 77.7* 79.5*  PLT 264 267   Basic Metabolic Panel: Recent Labs  Lab 11/26/22 0407 11/28/22 0412  NA 137 141  K 4.6 4.4  CL 103 104  CO2 27 28  GLUCOSE 85 84  BUN 23 25*  CREATININE 0.76 0.84  CALCIUM 8.8* 8.9  MG 2.4 2.3   Liver Function Tests: No results for input(s): "AST", "ALT", "ALKPHOS", "BILITOT", "PROT", "ALBUMIN" in the last 168 hours. CBG: No results for input(s): "GLUCAP" in the last 168 hours.  Discharge time spent: {LESS THAN/GREATER FAOZ:30865} 30 minutes.  Signed: Lurene Shadow, MD [facility] 12/01/2022

## 2022-12-01 NOTE — Progress Notes (Signed)
Progress Note    Cassandra Patterson  UEA:[id number] DOB: 25-Jun-1928  DOA: 11/09/2022 PCP: Leanna Sato, MD      Brief Narrative:    Medical records reviewed and are as summarized below:  Cassandra Patterson is a 89 y.o. female history of Mobitz type I second-degree heart block, history of TIA, history of hypertension, who presented to the emergency department because of worsening generalized weakness.  Vitals in the ED showed temperature of 98, respiration rate 20, heart rate of 85, blood pressure 99/85, improved to 126/72, SpO2 99% on room air.  Serum sodium is  142, potassium 4.3, chloride 109, bicarb 20, BUN of 54, serum creatinine 1.27, EGFR 40, nonfasting blood glucose 104, WBC 8.6, hemoglobin 9.6, platelets of 239.  Chest x-ray 2 view: Moderate cardiomegaly.  No active lung disease.  Multiple left lateral rib fractures, at least 1 shows periosteal new bone formation.  CT head wo contrast and CT cervical spine wo contrast: Widespread osseous metastatic disease including deposits that erodes through the inner table.  No acute intracranial finding.  No visible brain metastasis.  CT abdomen pelvis w contrast: Multiple lytic bone lesions throughout lumbosacral spine, left ischium, bilateral iliac bones, and ribs.  Pathologic fracture inferior left ischial tuberosity, T12, L5 vertebral bodies.  3.1 cm soft tissue mass of the left lateral body wall.  3.7 cm cystic lesion in the left breast.  Cholelithiasis.  Aortic atherosclerosis.  ED treatment: LR 1 L bolus.      Assessment/Plan:   Principal Problem:   Weakness Active Problems:   AKI (acute kidney injury) (HCC)   Lytic bone lesion of hip   Severe protein-calorie malnutrition (HCC)   Dry skin   Essential hypertension   First degree AV block   Mobitz type 1 second degree AV block   TIA (transient ischemic attack)   History of TIA (transient ischemic attack)   Metastasis to bone (HCC)   Normocytic anemia    Pressure injury of skin   Palliative care encounter   Malignant neoplasm of overlapping sites of left breast in female, estrogen receptor positive (HCC)   Nutrition Problem: Severe Malnutrition Etiology: chronic illness (concern for widespread metastatic disease)  Signs/Symptoms: moderate muscle depletion, severe muscle depletion, moderate fat depletion, severe fat depletion, percent weight loss   Body mass index is 14.53 kg/m.  (Underweight)   Weakness: likely secondary to metastatic cancer. PT/OT recs SNF but pt's insurance denied SNF even after peer to peer. Appeal to rescind insurance decision has been denied.  Patient is medically stable for discharge but there is no safe discharge plan at this time.  Family has decided to take patient home.  DME has been delivered to her home.  However, [name], sister, said that bed has not been made and patient cannot come home until bed is made. Per [name], Child psychotherapist, she is awaiting approval from insurance company for patient to go home with EMS.   Breast cancer: w/ extensive lytic bone lesions w/ metastatic lesions of lumbosacral spine, left ischium, bilateral iliac bones, ribs, pathologic fractures at the inferior left ischial tuberosity, T12, L5 vertebral bodies.  Large palpable breast mass as per onco. CT abd/pelvis shows 3.1 cm soft tissue mass left lateral body wall, 3.7 cm cystic lesion in the left breast. CT chest shows multiple lytic bone metastases in T spine, ribs, 3.1 cm soft tissue lesion in T9 vertebral body destroys right pedicle w/ tumor extension into the spinal cord with multiple  other findings, see CT impression.   Pathology showed metastatic moderately differentiated adenocarcinoma consistent with breast primary.  S/p CT-guided biopsy of left chest wall/breast mass on 11/12/2022. Continue letrozole.  Prescription sent to [facility] pharmacy on [facility] in [place] per [employer] request.   Poor cognitive function: continue w/  supportive care     AKI metabolic acidosis: resolved   Anemia of chronic disease: H&H stable.  No indication for blood transfusion at this time.    Severe protein-calorie malnutrition: continue on nutritional supplements      Hx of TIA: Continue aspirin    Hypertension: Continue felodipine   Intermittent sinus bradycardia, asymptomatic    Pressure injury left buttock: stage 3, present on admission. Continue w/ wound care     Partial thickness skin tear to left heel: Continue w/ daily wound care.  Completed 7 days of Augmentin on 11/26/2022.         Diet Order             Diet - low sodium heart healthy           Diet regular Room service appropriate? Yes; Fluid consistency: Thin  Diet effective now                            Consultants: Oncologist Interventional radiologist  Procedures: CT-guided biopsy of left chest wall/breast on 11/12/2022    Medications:    vitamin C  500 mg Oral BID   aspirin EC  81 mg Oral Daily   brimonidine  1 drop Both Eyes BID   calcitonin (salmon)  1 spray Alternating Nares Daily   cholecalciferol  1,000 Units Oral Daily   enoxaparin (LOVENOX) injection  30 mg Subcutaneous QHS   feeding supplement  237 mL Oral TID BM   felodipine  5 mg Oral Daily   latanoprost  1 drop Left Eye QHS   Latanoprostene Bunod  1 drop Ophthalmic QHS   letrozole  2.5 mg Oral Daily   multivitamin with minerals  1 tablet Oral Daily   Continuous Infusions:   Anti-infectives (From admission, onward)    Start     Dose/Rate Route Frequency Ordered Stop   11/26/22 2200  amoxicillin-clavulanate (AUGMENTIN) 500-125 MG per tablet 1 tablet        1 tablet Oral Every 12 hours 11/26/22 1501 11/26/22 2051   11/20/22 1415  amoxicillin-clavulanate (AUGMENTIN) 500-125 MG per tablet 1 tablet  Status:  Discontinued        1 tablet Oral Every 12 hours 11/20/22 1322 11/26/22 1501              Family Communication/Anticipated D/C date and  plan/Code Status   DVT prophylaxis: enoxaparin (LOVENOX) injection 30 mg Start: 11/09/22 2200 Place TED hose Start: 11/09/22 1441     Code Status: Limited: Do not attempt resuscitation (DNR) -DNR-LIMITED -Do Not Intubate/DNI   Family Communication: Plan discussed with [name] and Ethel, sisters, at the bedside Disposition Plan: Probably will be discharged home since she did not get insurance approval to go to SNF   Status is: Inpatient Remains inpatient appropriate because: Awaiting placement to SNF       Subjective:   Interval events noted.   Objective:    Vitals:   11/30/22 1611 11/30/22 1948 12/01/22 0410 12/01/22 0821  BP: 114/65 138/71 114/68 124/66  Pulse: 78 78 (!) 57 66  Resp: 18 18  18   Temp: 98.8 F (37.1 C) 98.7 F (  37.1 C) 98.9 F (37.2 C) 98.9 F (37.2 C)  TempSrc:    Oral  SpO2: 100% 97% 98% 96%  Weight:      Height:       No data found.   Intake/Output Summary (Last 24 hours) at 12/01/2022 1524 Last data filed at 12/01/2022 1044 Gross per 24 hour  Intake 120 ml  Output --  Net 120 ml    Filed Weights   11/09/22 1053  Weight: 37.2 kg    Exam:  GEN: NAD SKIN: Warm and dry EYES: No pallor or icterus ENT: MMM CV: RRR PULM: CTA B ABD: soft, ND, NT, +BS CNS: Alert, oriented to person,  non focal EXT: No edema or tenderness    Data Reviewed:   I have personally reviewed following labs and imaging studies:  Labs: Labs show the following:   Basic Metabolic Panel: Recent Labs  Lab 11/26/22 0407 11/28/22 0412  NA 137 141  K 4.6 4.4  CL 103 104  CO2 27 28  GLUCOSE 85 84  BUN 23 25*  CREATININE 0.76 0.84  CALCIUM 8.8* 8.9  MG 2.4 2.3   GFR Estimated Creatinine Clearance: 25.1 mL/min (by C-G formula based on SCr of 0.84 mg/dL). Liver Function Tests: No results for input(s): "AST", "ALT", "ALKPHOS", "BILITOT", "PROT", "ALBUMIN" in the last 168 hours. No results for input(s): "LIPASE", "AMYLASE" in the last 168 hours. No  results for input(s): "AMMONIA" in the last 168 hours. Coagulation profile No results for input(s): "INR", "PROTIME" in the last 168 hours.  CBC: Recent Labs  Lab 11/26/22 0407 11/28/22 0412  WBC 4.7 5.5  HGB 9.5* 9.7*  HCT 32.4* 33.0*  MCV 77.7* 79.5*  PLT 264 267   Cardiac Enzymes: No results for input(s): "CKTOTAL", "CKMB", "CKMBINDEX", "TROPONINI" in the last 168 hours. BNP (last 3 results) No results for input(s): "PROBNP" in the last 8760 hours. CBG: No results for input(s): "GLUCAP" in the last 168 hours. D-Dimer: No results for input(s): "DDIMER" in the last 72 hours. Hgb A1c: No results for input(s): "HGBA1C" in the last 72 hours. Lipid Profile: No results for input(s): "CHOL", "HDL", "LDLCALC", "TRIG", "CHOLHDL", "LDLDIRECT" in the last 72 hours. Thyroid function studies: No results for input(s): "TSH", "T4TOTAL", "T3FREE", "THYROIDAB" in the last 72 hours.  Invalid input(s): "FREET3" Anemia work up: No results for input(s): "VITAMINB12", "FOLATE", "FERRITIN", "TIBC", "IRON", "RETICCTPCT" in the last 72 hours. Sepsis Labs: Recent Labs  Lab 11/26/22 0407 11/28/22 0412  WBC 4.7 5.5    Microbiology No results found for this or any previous visit (from the past 240 hour(s)).  Procedures and diagnostic studies:  No results found.             LOS: 22 days   Chantel Teti  [facility] [employer] loss adjuster on [email or web address]. If 7PM-7AM, please contact night-coverage at [email or web address]     12/01/2022, 3:24 PM

## 2022-12-01 NOTE — Progress Notes (Signed)
Occupational Therapy Treatment Patient Details Name: Cassandra Patterson MRN: [id number] DOB: May 19, 1928 Today's Date: 12/01/2022   History of present illness Pt is a 89 yo female that presented to ED for weakness. PMH of TIA, HTN, mobitz. Imaging showed multiple L rib fractures and further workup showed osseous metastatic disease.   OT comments  Pt is supine in bed on arrival. Easily arousable and agreeable to OT session. She denies pain, but noted to grimace with all movements in bed, standing, etc. Pt performed bed mobility with MIN/Mod A, STS from EOB at elevated height with Mod A. She continues to need verb cues for sequencing and is slow to process instructions. Pt noted to urinate upon initial standing requiring full linen change. Pt scooted laterally to [facility] with Max A. She needed Min/Mod A to roll to bil sides using bedrails. Max A for hygiene and LB dressing to don mesh panties with ability to bridge to be pulled over hips. Pt able to sit at EOB with BUE support to maintain static balance for short period of time today. Sisters in room educated on proper body mechanics and techniques like raising the bed height, using a sheet to move pt in bed to prevent pulling on her arms/injury. They verbalized understanding. Pt left in bed with all needs in place and will cont to require skilled acute OT services to maximize her safety and IND to return to PLOF.       If plan is discharge home, recommend the following:  Assistance with cooking/housework;Assist for transportation;Help with stairs or ramp for entrance;Supervision due to cognitive status;Two people to help with walking and/or transfers;A lot of help with bathing/dressing/bathroom   Equipment Recommendations  BSC/3in1    Recommendations for Other Services      Precautions / Restrictions Precautions Precautions: Fall Precaution Comments: multiple sites of metatasic lesions Restrictions Weight Bearing Restrictions: No        Mobility Bed Mobility Overal bed mobility: Needs Assistance Bed Mobility: Supine to Sit, Sit to Supine     Supine to sit: Min assist, HOB elevated, Used rails Sit to supine: Mod assist, Used rails   General bed mobility comments: increased time for processing and verb cues for sequencing    Transfers Overall transfer level: Needs assistance Equipment used: Rolling walker (2 wheels) Transfers: Sit to/from Stand Sit to Stand: Mod assist, From elevated surface           General transfer comment: x1 stand at RW from elevated bed with Mod A and pt noted to urinate in the floor     Balance Overall balance assessment: Needs assistance Sitting-balance support: Feet supported Sitting balance-Leahy Scale: Fair Sitting balance - Comments: unilateral support on EOB at all times to maintain seated balance   Standing balance support: Bilateral upper extremity supported, During functional activity, Reliant on assistive device for balance Standing balance-Leahy Scale: Poor                             ADL either performed or assessed with clinical judgement   ADL Overall ADL's : Needs assistance/impaired             Lower Body Bathing: Maximal assistance;Bed level;Total assistance       Lower Body Dressing: Maximal assistance;Bed level Lower Body Dressing Details (indicate cue type and reason): mesh panties with pt able to bridge to pull over hips  Extremity/Trunk Assessment Upper Extremity Assessment Upper Extremity Assessment: Generalized weakness   Lower Extremity Assessment Lower Extremity Assessment: Generalized weakness   Cervical / Trunk Assessment Cervical / Trunk Assessment: Kyphotic    Vision       Perception     Praxis      Cognition Arousal: Alert Behavior During Therapy: WFL for tasks assessed/performed Overall Cognitive Status: Impaired/Different from baseline Area of Impairment: Orientation, Following  commands, Awareness, Problem solving, Safety/judgement, Attention, Memory                 Orientation Level: Disoriented to, Situation, Time     Following Commands: Follows one step commands inconsistently, Follows one step commands with increased time Safety/Judgement: Decreased awareness of safety, Decreased awareness of deficits Awareness: Intellectual Problem Solving: Slow processing, Decreased initiation, Difficulty sequencing, Requires verbal cues, Requires tactile cues          Exercises Other Exercises Other Exercises: educated family on bed techniques for body mechanics upon DC home with sister    Shoulder Instructions       General Comments L prevalon bootie    Pertinent Vitals/ Pain       Pain Assessment Pain Assessment: Faces Faces Pain Scale: Hurts a little bit Pain Location: grimaces with most movements Pain Intervention(s): Monitored during session  Home Living                                          Prior Functioning/Environment              Frequency  Min 1X/week        Progress Toward Goals  OT Goals(current goals can now be found in the care plan section)  Progress towards OT goals: Progressing toward goals  Acute Rehab OT Goals Patient Stated Goal: feel better OT Goal Formulation: With patient Time For Goal Achievement: 12/01/22 Potential to Achieve Goals: Good  Plan      Co-evaluation                 AM-PAC OT "6 Clicks" Daily Activity     Outcome Measure   Help from another person eating meals?: A Little Help from another person taking care of personal grooming?: A Little Help from another person toileting, which includes using toliet, bedpan, or urinal?: A Lot Help from another person bathing (including washing, rinsing, drying)?: A Lot Help from another person to put on and taking off regular upper body clothing?: A Little Help from another person to put on and taking off regular lower body  clothing?: A Lot 6 Click Score: 15    End of Session Equipment Utilized During Treatment: Gait belt;Rolling walker (2 wheels)  OT Visit Diagnosis: Other abnormalities of gait and mobility (R26.89);Muscle weakness (generalized) (M62.81);Unsteadiness on feet (R26.81)   Activity Tolerance Patient tolerated treatment well   Patient Left with bed alarm set;in bed;with call bell/phone within reach;with family/visitor present   Nurse Communication Mobility status        Time: 6948-5462 OT Time Calculation (min): 48 min  Charges: OT General Charges $OT Visit: 1 Visit OT Treatments $Self Care/Home Management : 38-52 mins  Anisia Leija, OTR/L  12/01/22, 4:36 PM  [name] 12/01/2022, 4:33 PM

## 2022-12-01 NOTE — TOC Progression Note (Addendum)
Transition of Care [facility]) - Progression Note    Patient Details  Name: CANDIDA VETTER MRN: [id number] Date of Birth: 1928/12/23  [facility] CM/SW Contact  Allena Katz, LCSW Phone Number: 12/01/2022, 10:15 AM  Clinical Narrative:   Message left with [employer] to see if we could get ems transport approved.  CSW waiting to hear back from [place] with [employer] on if DME has been delivered.      Expected Discharge Plan: Skilled Nursing Facility Barriers to Discharge: Continued Medical Work up, SNF Pending bed offer, Insurance Authorization  Expected Discharge Plan and Services                                               Social Determinants of Health (SDOH) Interventions SDOH Screenings   Food Insecurity: Patient Unable To Answer (11/09/2022)  Housing: Patient Unable To Answer (11/09/2022)  Transportation Needs: Patient Unable To Answer (11/09/2022)  Utilities: Patient Unable To Answer (11/09/2022)  Tobacco Use: Low Risk  (11/09/2022)    Readmission Risk Interventions     No data to display

## 2022-12-01 NOTE — Plan of Care (Signed)
Problem: Education: Goal: Knowledge of General Education information will improve Description: Including pain rating scale, medication(s)/side effects and non-pharmacologic comfort measures Outcome: Progressing   Problem: Health Behavior/Discharge Planning: Goal: Ability to manage health-related needs will improve Outcome: Progressing   Problem: Clinical Measurements: Goal: Ability to maintain clinical measurements within normal limits will improve Outcome: Progressing   Problem: Nutrition: Goal: Adequate nutrition will be maintained Outcome: Progressing   Problem: Elimination: Goal: Will not experience complications related to bowel motility Outcome: Progressing

## 2022-12-02 DIAGNOSIS — R531 Weakness: Secondary | ICD-10-CM | POA: Diagnosis not present

## 2022-12-02 NOTE — Discharge Summary (Signed)
Physician Discharge Summary   Patient: Cassandra Patterson MRN: [id number] DOB: 1928-09-24  Admit date:     11/09/2022  Discharge date: 12/02/22  Discharge Physician: Lurene Shadow   PCP: Leanna Sato, MD   Recommendations at discharge:    Follow-up with PCP in 1 week Follow-up with oncologist (office will call to schedule appointment)  Discharge Diagnoses: Principal Problem:   Weakness Active Problems:   AKI (acute kidney injury) (HCC)   Lytic bone lesion of hip   Severe protein-calorie malnutrition (HCC)   Dry skin   Essential hypertension   First degree AV block   Mobitz type 1 second degree AV block   TIA (transient ischemic attack)   History of TIA (transient ischemic attack)   Metastasis to bone (HCC)   Normocytic anemia   Pressure injury of skin   Palliative care encounter   Malignant neoplasm of overlapping sites of left breast in female, estrogen receptor positive (HCC)  Resolved Problems:   * No resolved hospital problems. *  Hospital Course:  Cassandra Patterson is a 89 y.o. female history of Mobitz type I second-degree heart block, history of TIA, history of hypertension, who presented to the emergency department because of worsening generalized weakness.   Vitals in the ED showed temperature of 98, respiration rate 20, heart rate of 85, blood pressure 99/85, improved to 126/72, SpO2 99% on room air.   Serum sodium is  142, potassium 4.3, chloride 109, bicarb 20, BUN of 54, serum creatinine 1.27, EGFR 40, nonfasting blood glucose 104, WBC 8.6, hemoglobin 9.6, platelets of 239.   Chest x-ray 2 view: Moderate cardiomegaly.  No active lung disease.  Multiple left lateral rib fractures, at least 1 shows periosteal new bone formation.   CT head wo contrast and CT cervical spine wo contrast: Widespread osseous metastatic disease including deposits that erodes through the inner table.  No acute intracranial finding.  No visible brain metastasis.   CT abdomen  pelvis w contrast: Multiple lytic bone lesions throughout lumbosacral spine, left ischium, bilateral iliac bones, and ribs.  Pathologic fracture inferior left ischial tuberosity, T12, L5 vertebral bodies.  3.1 cm soft tissue mass of the left lateral body wall.  3.7 cm cystic lesion in the left breast.  Cholelithiasis.  Aortic atherosclerosis.   Assessment and Plan: Weakness: likely secondary to metastatic cancer. PT/OT recs SNF but pt's insurance denied SNF even after peer to peer. Appeal to rescind insurance decision has been denied.  Patient is medically stable for discharge but there is no safe discharge plan at this time.  Family has decided to take patient home.  DME has been delivered to her home.  However, Cassandra Patterson, sister, said that bed has not been made and patient cannot come home until bed is made. Per [name], Child psychotherapist, she is awaiting approval from insurance company for patient to go home with EMS.     Breast cancer: w/ extensive lytic bone lesions w/ metastatic lesions of lumbosacral spine, left ischium, bilateral iliac bones, ribs, pathologic fractures at the inferior left ischial tuberosity, T12, L5 vertebral bodies.  Large palpable breast mass as per onco. CT abd/pelvis shows 3.1 cm soft tissue mass left lateral body wall, 3.7 cm cystic lesion in the left breast. CT chest shows multiple lytic bone metastases in T spine, ribs, 3.1 cm soft tissue lesion in T9 vertebral body destroys right pedicle w/ tumor extension into the spinal cord with multiple other findings, see CT impression.   Pathology showed metastatic  moderately differentiated adenocarcinoma consistent with breast primary.  S/p CT-guided biopsy of left chest wall/breast mass on 11/12/2022. Continue letrozole.  Prescription sent to [facility] pharmacy on [address] in [place] per [employer] request.     Poor cognitive function: continue w/ supportive care      AKI, metabolic acidosis: resolved     Anemia of chronic  disease: H&H stable.  No indication for blood transfusion at this time.     Severe protein-calorie malnutrition: continue on nutritional supplements       Hx of TIA: Continue aspirin     Hypertension: Continue felodipine     Intermittent sinus bradycardia, asymptomatic     Pressure injury left buttock: stage 3, present on admission. Continue w/ wound care      Partial thickness skin tear to left heel: Continue w/ daily wound care.  Completed 7 days of Augmentin on 11/26/2022.     Patient is medically stable for discharge.  Discharge plan was discussed with Cassandra Patterson, sister, at the bedside.  She confirmed that medical equipment have been delivered to her home.       Consultants: Oncologist, interventional radiologist Procedures performed: CT-guided biopsy of left chest wall/breast on 11/12/2022   Disposition: Home Diet recommendation:  Discharge Diet Orders (From admission, onward)     Start     Ordered   12/01/22 0000  Diet - low sodium heart healthy        12/01/22 1415           Cardiac diet DISCHARGE MEDICATION: Allergies as of 12/02/2022   No Known Allergies      Medication List     STOP taking these medications    COD LIVER OIL PO       TAKE these medications    acetaminophen 650 MG CR tablet Commonly known as: TYLENOL Take 650 mg by mouth every 8 (eight) hours as needed for pain.   ammonium lactate 12 % cream Commonly known as: AMLACTIN Apply topically as needed.   aspirin EC 81 MG tablet Take 81 mg by mouth daily. Swallow whole.   brimonidine 0.15 % ophthalmic solution Commonly known as: ALPHAGAN Place 1 drop into both eyes 2 (two) times daily.   calcitonin (salmon) 200 UNIT/ACT nasal spray Commonly known as: MIACALCIN/FORTICAL Place 1 spray into alternate nostrils daily.   felodipine 5 MG 24 hr tablet Commonly known as: PLENDIL TAKE 1 TABLET(5 MG) BY MOUTH EVERY DAY   letrozole 2.5 MG tablet Commonly known as: FEMARA Take 1  tablet (2.5 mg total) by mouth daily.   Lumigan 0.01 % Soln Generic drug: bimatoprost Place 1 drop into the left eye at bedtime.   ONE-A-DAY WOMENS PO Take 1 tablet by mouth daily.   Vitamin D-3 25 MCG (1000 UT) Caps Take by mouth daily.   VYZULTA OP Apply 1 drop to eye at bedtime. Right eye               Durable Medical Equipment  (From admission, onward)           Start     Ordered   11/29/22 0000  For home use only DME Hospital bed       Question Answer Comment  Length of Need Lifetime   Bed type Semi-electric      11/29/22 1604   11/29/22 0000  For home use only DME 3 n 1        11/29/22 1604  Discharge Care Instructions  (From admission, onward)           Start     Ordered   12/01/22 0000  Discharge wound care:       Comments: Cleanse L heel wound with Vashe wound cleanser and then apply Vashe moistened gauze to wound bed 2 times daily, cover with dry gauze and secure with Kerlix roll gauze.   12/01/22 1415            Discharge Exam: Filed Weights   11/09/22 1053  Weight: 37.2 kg   GEN: NAD SKIN: Warm and dry EYES: No pallor or icterus ENT: MMM CV: RRR PULM: CTA B ABD: soft, ND, NT, +BS CNS: AAO x 1 (person), non focal EXT: No edema or tenderness   Condition at discharge: stable  The results of significant diagnostics from this hospitalization (including imaging, microbiology, ancillary and laboratory) are listed below for reference.   Imaging Studies: CT ASPIRATION N/S  Result Date: 11/12/2022 INDICATION: 89 year old female referred for biopsy L5 vertebral body, with inability to position on left decubitus or prone. We will proceed after speaking with oncology team in the patient's family with a left chest wall/breast mass biopsy EXAM: CT BIOPSY PUNCTURE ASPIRATION SUPERFICIAL FLUID MEDICATIONS: None. ANESTHESIA/SEDATION: Moderate (conscious) sedation was not employed during this procedure. A total of Versed 0.5  mg and Fentanyl 0 mcg was administered intravenously. Moderate Sedation Time: 0 minutes. The patient's level of consciousness and vital signs were monitored continuously by radiology nursing throughout the procedure under my direct supervision. FLUOROSCOPY TIME:  CT COMPLICATIONS: None PROCEDURE: Informed written consent was obtained from the patient after a thorough discussion of the procedural risks, benefits and alternatives. All questions were addressed. Maximal Sterile Barrier Technique was utilized including caps, mask, sterile gowns, sterile gloves, sterile drape, hand hygiene and skin antiseptic. A timeout was performed prior to the initiation of the procedure. Patient was positioned supine on the CT gantry table. Scout CT of the chest performed for planning purposes. The patient is prepped and draped in the usual sterile fashion. 1% lidocaine was used for local anesthesia. Using CT guidance, guide needle was advanced into the solid mass of the left chest wall/breast. Once we confirmed needle tip position multiple 18 gauge core biopsy were acquired. Needle was removed. Final CT was acquired. Manual pressure was used for hemostasis. Sterile bandage was placed. Patient tolerated the procedure well and remained hemodynamically stable throughout. No complications were encountered and no significant blood loss. IMPRESSION: Status post CT-guided biopsy of left chest wall mass/breast mass. Signed, Yvone Neu. Miachel Roux, RPVI [facility] Electronically Signed   By: Gilmer Mor D.O.   On: 11/12/2022 16:07   MR THORACIC SPINE W WO CONTRAST  Result Date: 11/11/2022 CLINICAL DATA:  Metastatic disease evaluation.  Weakness. EXAM: MRI THORACIC WITHOUT AND WITH CONTRAST TECHNIQUE: Multiplanar and multiecho pulse sequences of the thoracic spine were obtained without and with intravenous contrast. CONTRAST:  4mL GADAVIST GADOBUTROL 1 MMOL/ML IV SOLN  COMPARISON:  CT chest 11/10/2022 FINDINGS: Multiple sequences are moderately motion degraded. Alignment: Increased thoracic kyphosis. Thoracic dextroscoliosis. No significant listhesis. Vertebrae: Widespread enhancing lesions throughout the thoracic spine as well as the cervical and included upper lumbar spine. Large destructive lesion involving the right aspect of the T9 vertebral body and pedicle as seen on CT with right-sided ventral and lateral epidural tumor not resulting in significant spinal stenosis or spinal cord mass effect. Small volume left-sided ventral epidural  tumor at T2 without stenosis. T6 and T8 compression fractures with 50% anterior vertebral body height loss. Multiple rib lesions and rib fractures, better demonstrated on the prior CT. Cord:  Normal signal and morphology. Paraspinal and other soft tissues: Small bilateral pleural effusions. Disc levels: Mild thoracic spondylosis. No spinal stenosis. Moderate right neural foraminal encroachment at T9-10 due to tumor. IMPRESSION: 1. Widespread osseous metastases. 2. Epidural tumor at T2 and T9 without significant spinal stenosis. 3. T6 and T8 compression fractures. Electronically Signed   By: Sebastian Ache M.D.   On: 11/11/2022 18:34   CT CHEST WO CONTRAST  Result Date: 11/11/2022 CLINICAL DATA:  Breast cancer staging.  * Tracking Code: BO * EXAM: CT CHEST WITHOUT CONTRAST TECHNIQUE: Multidetector CT imaging of the chest was performed following the standard protocol without IV contrast. RADIATION DOSE REDUCTION: This exam was performed according to the departmental dose-optimization program which includes automated exposure control, adjustment of the mA and/or kV according to patient size and/or use of iterative reconstruction technique. COMPARISON:  None Available. FINDINGS: Cardiovascular: The heart is markedly enlarged. No substantial pericardial effusion. Ascending thoracic aorta measures on the order of 4.2 cm diameter. Descending thoracic  aorta measures 3.8 cm diameter. Enlargement of the pulmonary outflow tract/main pulmonary arteries suggests pulmonary arterial hypertension. There is moderate atherosclerotic calcification of the abdominal aorta without aneurysm. Mediastinum/Nodes: 1.6 cm nodule identified right thyroid lobe. Upper normal mediastinal lymph nodes evident although assessment limited by lack of mediastinal fat and lack of intravenous contrast material. No evidence for gross hilar lymphadenopathy although assessment is limited by the lack of intravenous contrast on the current study. The esophagus has normal imaging features. No right axillary lymphadenopathy. Left axilla incompletely visualized. 10 mm short axis left subpectoral lymph node seen on image 49/2 with probable greater than 18 mm short axis left axillary node on 76/2, incompletely visualized. Left breast is been incompletely visualized with greater than 3 cm homogeneous well-defined incompletely visualized structure noted on image 110/2. Lungs/Pleura: Centrilobular emphsyema noted. Perifissural nodule measuring 4 mm noted right lung on 60/4 with additional 4-5 mm nodules in the right lung on images 68 and 89 of series 4. There is dependent collapse/consolidation in both lower lungs with small bilateral pleural effusions. Upper Abdomen: Unremarkable on noncontrast imaging. Musculoskeletal: Multiple lytic bone metastases evident in the thoracic spine and ribs including posterior right second rib on image 3/series 2. Metastatic lesion in the posterior T2 vertebral body erodes the posterior cortex. 3.1 cm soft tissue lesion in the T9 vertebral body destroys the right pedicle and posterior cortex of the vertebral body with tumor extension into the spinal canal (see image 68/2). Chronic fracture nonunion noted in the right clavicle pathologic fracture noted posterior right ninth and tenth ribs as well as the posterior left twelfth in tenth ribs. Multiple nonacute left-sided rib  fractures evident. IMPRESSION: 1. Multiple lytic bone metastases in the thoracic spine and ribs. 3.1 cm soft tissue lesion in the T9 vertebral body destroys the right pedicle and posterior cortex of the vertebral body with tumor extension into the spinal canal. MRI of the thoracic spine with and without contrast recommended to further evaluate. 2. Metastatic lesion in the T2 vertebral body destroys the posterior cortex on sagittal imaging suggests extension in the anterior spinal canal. 3. Pathologic fractures of the posterior right ninth and tenth ribs as well as the posterior left twelfth and tenth ribs. Innumerable thoracic spine and rib metastases evident. 4. Left axillary and subpectoral lymphadenopathy, incompletely visualized.  5. 1.6 cm right thyroid lobe nodule. In the setting of significant comorbidities or limited life expectancy, no follow-up recommended (ref: J Am Coll Radiol. 2015 Feb;12(2): 143-50). 6. Small bilateral pleural effusions with dependent collapse/consolidation in both lower lungs. 7. Tiny right lung nodules.  Metastatic disease not excluded. 8. Aortic Atherosclerosis (ICD10-I70.0) and Emphysema (ICD10-J43.9). Electronically Signed   By: Kennith Center M.D.   On: 11/11/2022 10:29   CT ABDOMEN PELVIS W CONTRAST  Result Date: 11/09/2022 CLINICAL DATA:  Acute abdominal pain EXAM: CT ABDOMEN AND PELVIS WITH CONTRAST TECHNIQUE: Multidetector CT imaging of the abdomen and pelvis was performed using the standard protocol following bolus administration of intravenous contrast. RADIATION DOSE REDUCTION: This exam was performed according to the departmental dose-optimization program which includes automated exposure control, adjustment of the mA and/or kV according to patient size and/or use of iterative reconstruction technique. CONTRAST:  75mL OMNIPAQUE IOHEXOL 300 MG/ML  SOLN COMPARISON:  None Available. FINDINGS: Lower chest: T9 vertebral body lytic metastasis. Multiple lytic rib metastases.  3.1 cm soft tissue mass left lateral body wall. No pleural or pericardial effusion. Dependent atelectasis in the lung bases. 3.7 cm cystic lesion in the left breast. Hepatobiliary: Subcentimeter calcified stone in the dependent aspect of the nondilated gallbladder. No focal liver lesion or biliary ductal dilatation. Pancreas: Unremarkable. No pancreatic ductal dilatation or surrounding inflammatory changes. Spleen: Normal in size without focal abnormality. Adrenals/Urinary Tract: No adrenal mass. Symmetric renal parenchymal enhancement without focal lesion or evident urolithiasis. Urinary bladder nondistended. Stomach/Bowel: Stomach decompressed. Small bowel nondilated. Normal appendix. The colon is partially distended, without acute finding. Vascular/Lymphatic: Moderate scattered calcified aortoiliac atheromatous plaque without aneurysm or stenosis. Portal vein patent. No abdominal or pelvic adenopathy. Reproductive: Status post hysterectomy. No adnexal masses. Other: No ascites.  No free air. Musculoskeletal: Multiple lytic bone lesions throughout the lumbosacral spine, left ischium, bilateral iliac bones. Lytic lesion in the inferior left ischial tuberosity with pathologic fracture, minimally displaced. Pathologic compression deformity of L5 with a greater than 50% loss of height. Mild compression deformity of T12. fixation hardware in the posterior left acetabulum. IMPRESSION: 1. Multiple lytic bone lesions throughout the lumbosacral spine, left ischium, bilateral iliac bones, and ribs. Pathologic fractures of the inferior left ischial tuberosity, T12 and L5 vertebral bodies. 2. 3.1 cm soft tissue mass left lateral body wall. 3. 3.7 cm cystic lesion in the left breast. 4. Cholelithiasis. 5.  Aortic Atherosclerosis (ICD10-I70.0). Electronically Signed   By: Corlis Leak M.D.   On: 11/09/2022 13:53   CT Head Wo Contrast  Result Date: 11/09/2022 CLINICAL DATA:  Mental status change with unknown cause. Neck trauma  EXAM: CT HEAD WITHOUT CONTRAST CT CERVICAL SPINE WITHOUT CONTRAST TECHNIQUE: Multidetector CT imaging of the head and cervical spine was performed following the standard protocol without intravenous contrast. Multiplanar CT image reconstructions of the cervical spine were also generated. RADIATION DOSE REDUCTION: This exam was performed according to the departmental dose-optimization program which includes automated exposure control, adjustment of the mA and/or kV according to patient size and/or use of iterative reconstruction technique. COMPARISON:  Brain MRI 01/07/2021 FINDINGS: CT HEAD FINDINGS Brain: No evidence of acute infarction, hemorrhage, hydrocephalus, extra-axial collection or mass lesion/mass effect. Small right cerebellar infarct, chronic by prior MRI. Generalized brain atrophy. Vascular: No hyperdense vessel or unexpected calcification. Skull: Multiple lytic lesions scattered throughout the calvarium. Parasagittal right frontal and parasagittal left parietal deposits erode the inner table. Additional notable lesion in the left sphenoid wing eroding the inner table. Sinuses/Orbits:  No acute finding CT CERVICAL SPINE FINDINGS Alignment: Normal Skull base and vertebrae: No acute fracture. Multiple lytic bone lesions seen in the C3, C6, C7, T1, T2, and T3 bodies. Notable posterior element deposits at T1-T4. Partially covered nonacute right clavicle fracture with bulky callus that could obscure underlying lesion. Soft tissues and spinal canal: No prevertebral fluid or swelling. No visible canal hematoma. Disc levels:  Ordinary degenerative spurring. Upper chest: Sizable metastatic deposit at the right second rib posteriorly. IMPRESSION: Widespread osseous metastatic disease including deposits that erodes through the inner table. No acute intracranial finding.  No visible brain metastasis. Electronically Signed   By: Tiburcio Pea M.D.   On: 11/09/2022 11:45   CT Cervical Spine Wo Contrast  Result  Date: 11/09/2022 CLINICAL DATA:  Mental status change with unknown cause. Neck trauma EXAM: CT HEAD WITHOUT CONTRAST CT CERVICAL SPINE WITHOUT CONTRAST TECHNIQUE: Multidetector CT imaging of the head and cervical spine was performed following the standard protocol without intravenous contrast. Multiplanar CT image reconstructions of the cervical spine were also generated. RADIATION DOSE REDUCTION: This exam was performed according to the departmental dose-optimization program which includes automated exposure control, adjustment of the mA and/or kV according to patient size and/or use of iterative reconstruction technique. COMPARISON:  Brain MRI 01/07/2021 FINDINGS: CT HEAD FINDINGS Brain: No evidence of acute infarction, hemorrhage, hydrocephalus, extra-axial collection or mass lesion/mass effect. Small right cerebellar infarct, chronic by prior MRI. Generalized brain atrophy. Vascular: No hyperdense vessel or unexpected calcification. Skull: Multiple lytic lesions scattered throughout the calvarium. Parasagittal right frontal and parasagittal left parietal deposits erode the inner table. Additional notable lesion in the left sphenoid wing eroding the inner table. Sinuses/Orbits: No acute finding CT CERVICAL SPINE FINDINGS Alignment: Normal Skull base and vertebrae: No acute fracture. Multiple lytic bone lesions seen in the C3, C6, C7, T1, T2, and T3 bodies. Notable posterior element deposits at T1-T4. Partially covered nonacute right clavicle fracture with bulky callus that could obscure underlying lesion. Soft tissues and spinal canal: No prevertebral fluid or swelling. No visible canal hematoma. Disc levels:  Ordinary degenerative spurring. Upper chest: Sizable metastatic deposit at the right second rib posteriorly. IMPRESSION: Widespread osseous metastatic disease including deposits that erodes through the inner table. No acute intracranial finding.  No visible brain metastasis. Electronically Signed   By:  Tiburcio Pea M.D.   On: 11/09/2022 11:45   DG Chest 2 View  Result Date: 11/09/2022 CLINICAL DATA:  Chest pain.  Back and foot wounds. EXAM: CHEST - 2 VIEW COMPARISON:  01/06/2021 FINDINGS: Moderate cardiomegaly again noted. Ectasia and atherosclerotic calcification of the thoracic aorta again seen. Both lungs remain clear. No evidence of pneumothorax or pleural effusion. Several displaced left lateral rib fracture deformities are seen several displaced rib fractures are seen involving the left lateral 4th through 9th ribs, at least 1 of which shows periosteal new bone formation. IMPRESSION: Moderate cardiomegaly. No active lung disease. Multiple left lateral rib fractures, at least 1 of which shows periosteal new bone formation. Electronically Signed   By: Danae Orleans M.D.   On: 11/09/2022 11:16    Microbiology: Results for orders placed or performed during the hospital encounter of 01/06/21  Resp Panel by RT-PCR (Flu A&B, Covid) Nasopharyngeal Swab     Status: None   Collection Time: 01/06/21  8:23 PM   Specimen: Nasopharyngeal Swab; Nasopharyngeal(NP) swabs in vial transport medium  Result Value Ref Range Status   SARS Coronavirus 2 by RT PCR NEGATIVE NEGATIVE Final  Comment: (NOTE) SARS-CoV-2 target nucleic acids are NOT DETECTED.  The SARS-CoV-2 RNA is generally detectable in upper respiratory specimens during the acute phase of infection. The lowest concentration of SARS-CoV-2 viral copies this assay can detect is 138 copies/mL. A negative result does not preclude SARS-Cov-2 infection and should not be used as the sole basis for treatment or other patient management decisions. A negative result may occur with  improper specimen collection/handling, submission of specimen other than nasopharyngeal swab, presence of viral mutation(s) within the areas targeted by this assay, and inadequate number of viral copies(<138 copies/mL). A negative result must be combined with clinical  observations, patient history, and epidemiological information. The expected result is Negative.  Fact Sheet for Patients:  BloggerCourse.com  Fact Sheet for Healthcare Providers:  SeriousBroker.it  This test is no t yet approved or cleared by the [place] FDA and  has been authorized for detection and/or diagnosis of SARS-CoV-2 by FDA under an Emergency Use Authorization (EUA). This EUA will remain  in effect (meaning this test can be used) for the duration of the COVID-19 declaration under Section 564(b)(1) of the Act, 21 U.S.C.section 360bbb-3(b)(1), unless the authorization is terminated  or revoked sooner.       Influenza A by PCR NEGATIVE NEGATIVE Final   Influenza B by PCR NEGATIVE NEGATIVE Final    Comment: (NOTE) The Xpert Xpress SARS-CoV-2/FLU/RSV plus assay is intended as an aid in the diagnosis of influenza from Nasopharyngeal swab specimens and should not be used as a sole basis for treatment. Nasal washings and aspirates are unacceptable for Xpert Xpress SARS-CoV-2/FLU/RSV testing.  Fact Sheet for Patients: BloggerCourse.com  Fact Sheet for Healthcare Providers: SeriousBroker.it  This test is not yet approved or cleared by the [place] FDA and has been authorized for detection and/or diagnosis of SARS-CoV-2 by FDA under an Emergency Use Authorization (EUA). This EUA will remain in effect (meaning this test can be used) for the duration of the COVID-19 declaration under Section 564(b)(1) of the Act, 21 U.S.C. section 360bbb-3(b)(1), unless the authorization is terminated or revoked.  Performed at [facility], [address] [place], [place] [address]     Labs: CBC: Recent Labs  Lab 11/26/22 0407 11/28/22 0412  WBC 4.7 5.5  HGB 9.5* 9.7*  HCT 32.4* 33.0*  MCV 77.7* 79.5*  PLT 264 267   Basic Metabolic Panel: Recent Labs  Lab  11/26/22 0407 11/28/22 0412  NA 137 141  K 4.6 4.4  CL 103 104  CO2 27 28  GLUCOSE 85 84  BUN 23 25*  CREATININE 0.76 0.84  CALCIUM 8.8* 8.9  MG 2.4 2.3   Liver Function Tests: No results for input(s): "AST", "ALT", "ALKPHOS", "BILITOT", "PROT", "ALBUMIN" in the last 168 hours. CBG: No results for input(s): "GLUCAP" in the last 168 hours.  Discharge time spent: greater than 30 minutes.  Signed: Lurene Shadow, MD [facility] 12/02/2022

## 2022-12-02 NOTE — Progress Notes (Signed)
Reviewed how to perform wound care to left heal with her sister [name] and she verbalized understanding.

## 2022-12-02 NOTE — TOC Progression Note (Addendum)
Transition of Care [facility]) - Progression Note    Patient Details  Name: Cassandra Patterson MRN: [id number] Date of Birth: May 04, 1928  [facility]) CM/SW Contact  Allena Katz, LCSW Phone Number: 12/02/2022, 9:28 AM  Clinical Narrative:    Second message left with [facility] to approve  EMS approved [phone].     Expected Discharge Plan: Skilled Nursing Facility Barriers to Discharge: Continued Medical Work up, SNF Pending bed offer, Insurance Authorization  Expected Discharge Plan and Services         Expected Discharge Date: 12/01/22                                     Social Determinants of Health (SDOH) Interventions SDOH Screenings   Food Insecurity: Patient Unable To Answer (11/09/2022)  Housing: Patient Unable To Answer (11/09/2022)  Transportation Needs: Patient Unable To Answer (11/09/2022)  Utilities: Patient Unable To Answer (11/09/2022)  Tobacco Use: Low Risk  (11/09/2022)    Readmission Risk Interventions     No data to display

## 2022-12-02 NOTE — Progress Notes (Signed)
Received Md order to discharge patient to home.  I reviewed all discharge instructions, home meds, prescriptions and follow up appointments with her sister Ryana Montecalvo and she verbalized understanding

## 2022-12-02 NOTE — Care Management Important Message (Signed)
Important Message  Patient Details  Name: Cassandra Patterson MRN: [id number] Date of Birth: [birth year]   Important Message Given:  Yes - Medicare IM  I reviewed the Important Message from Medicare with the patient's sister, Cassandra Patterson again by phone [phone]) and she is in agreement with the discharge. I wished her sister a speefy recovery and thanked her for time.   Cassandra Patterson 12/02/2022, 11:03 AM

## 2022-12-02 NOTE — TOC Transition Note (Addendum)
Transition of Care [facility]) - CM/SW Discharge Note   Patient Details  Name: Cassandra Patterson MRN: [id number] Date of Birth: 09-Apr-1928  Transition of Care [facility]) CM/SW Contact:  Allena Katz, LCSW Phone Number: 12/02/2022, 11:20 AM   Clinical Narrative:   Pt is discharging home with [facility] PT/OT/RN. Sister has been educated on how to do wound care on days [facility] is not able to come out. Pt is being transported by EMS. Medical necessity on unit. EMS called patient second on list. RN to notify sister.  Sister reports [employer] has delivered all needed DME.    Sister reports that she would like a referral to be made to [place] for home care services through [facility]. Referral faxed to [facility]. Message left with Melissa at [facility].   Final next level of care: Home w Home Health Services Barriers to Discharge: Barriers Resolved   Patient Goals and CMS Choice CMS Medicare.gov Compare Post Acute Care list provided to:: Patient Choice offered to / list presented to : Patient  Discharge Placement                  Patient to be transferred to facility by: [facility] Name of family member notified: sister vera Patient and family notified of of transfer: 12/02/22  Discharge Plan and Services Additional resources added to the After Visit Summary for                                       Social Determinants of Health (SDOH) Interventions SDOH Screenings   Food Insecurity: Patient Unable To Answer (11/09/2022)  Housing: Patient Unable To Answer (11/09/2022)  Transportation Needs: Patient Unable To Answer (11/09/2022)  Utilities: Patient Unable To Answer (11/09/2022)  Tobacco Use: Low Risk  (11/09/2022)     Readmission Risk Interventions     No data to display

## 2022-12-02 NOTE — Progress Notes (Signed)
Physical Therapy Treatment Patient Details Name: Cassandra Patterson MRN: [id number] DOB: 1928/05/12 Today's Date: 12/02/2022   History of Present Illness Pt is a 89 yo female that presented to ED for weakness. PMH of TIA, HTN, mobitz. Imaging showed multiple L rib fractures and further workup showed osseous metastatic disease.    PT Comments  Pt resting in bed upon PT arrival; pt agreeable to therapy; pt's sister present who was agreeable to therapy as well but requesting no ambulation in order not to fatigue pt (d/t possible discharge home today).  No c/o pain throughout session.  During session pt max assist semi-supine to sitting EOB; mod assist to stand from bed; and mod assist to take steps bed to recliner with RW use.  Pt's sister educated on safe transfers and safe hand placement to prevent injuries: pt's sister verbalizing understanding and reports her sister is a retired Engineer, civil (consulting) and would be coming today to assist with pt's care.  Pt's sister reports no further therapy related questions for discharge today.    If plan is discharge home, recommend the following: A lot of help with walking and/or transfers;A lot of help with bathing/dressing/bathroom;Assistance with cooking/housework;Direct supervision/assist for medications management;Direct supervision/assist for financial management;Assist for transportation;Help with stairs or ramp for entrance;Supervision due to cognitive status   Can travel by private vehicle     No  Equipment Recommendations  BSC/3in1;Wheelchair (measurements PT);Wheelchair cushion (measurements PT);Hospital bed;Hoyer lift;Rolling walker (2 wheels)    Recommendations for Other Services       Precautions / Restrictions Precautions Precautions: Fall Precaution Comments: multiple sites of metatasic lesions Restrictions Weight Bearing Restrictions: No     Mobility  Bed Mobility Overal bed mobility: Needs Assistance Bed Mobility: Supine to Sit     Supine  to sit: Max assist, HOB elevated     General bed mobility comments: use of bed pad to assist with movement; increased time for processing/sequencing; assist for trunk and B LE's    Transfers Overall transfer level: Needs assistance Equipment used: Rolling walker (2 wheels) Transfers: Sit to/from Stand Sit to Stand: Mod assist   Step pivot transfers: Mod assist       General transfer comment: assist to steady; vc's for taking steps and walker use; increased effort/time to take steps    Ambulation/Gait               General Gait Details: Deferred (pt's sister requesting not to tire pt out d/t possible discharge home today)   Stairs             Wheelchair Mobility     Tilt Bed    Modified Rankin (Stroke Patients Only)       Balance Overall balance assessment: Needs assistance Sitting-balance support: Bilateral upper extremity supported, Feet supported Sitting balance-Leahy Scale: Fair Sitting balance - Comments: steady static sitting   Standing balance support: Bilateral upper extremity supported, Reliant on assistive device for balance Standing balance-Leahy Scale: Poor Standing balance comment: posterior lean noted in standing requiring assist to correct                            Cognition Arousal: Alert Behavior During Therapy: WFL for tasks assessed/performed   Area of Impairment: Memory, Following commands, Safety/judgement, Awareness, Problem solving                 Orientation Level:  (Oriented to at least name and DOB)   Memory: Decreased short-term memory  Following Commands: Follows one step commands with increased time, Follows multi-step commands inconsistently Safety/Judgement: Decreased awareness of safety, Decreased awareness of deficits Awareness: Intellectual Problem Solving: Slow processing, Decreased initiation, Difficulty sequencing, Requires verbal cues, Requires tactile cues          Exercises       General Comments General comments (skin integrity, edema, etc.): L prevalon boot in place end of session.  Nursing cleared pt for participation in physical therapy.  Pt agreeable to PT session.      Pertinent Vitals/Pain Pain Assessment Pain Assessment: No/denies pain Pain Intervention(s): Limited activity within patient's tolerance, Monitored during session, Repositioned Vitals (HR and SpO2 sats on room air) stable and WFL throughout treatment session.    Home Living                          Prior Function            PT Goals (current goals can now be found in the care plan section) Acute Rehab PT Goals Patient Stated Goal: none stated PT Goal Formulation: With patient/family Time For Goal Achievement: 12/16/22 Potential to Achieve Goals: Fair Progress towards PT goals: Progressing toward goals    Frequency    Min 1X/week      PT Plan      Co-evaluation              AM-PAC PT "6 Clicks" Mobility   Outcome Measure  Help needed turning from your back to your side while in a flat bed without using bedrails?: A Lot Help needed moving from lying on your back to sitting on the side of a flat bed without using bedrails?: A Lot Help needed moving to and from a bed to a chair (including a wheelchair)?: A Lot Help needed standing up from a chair using your arms (e.g., wheelchair or bedside chair)?: A Lot Help needed to walk in hospital room?: A Lot Help needed climbing 3-5 steps with a railing? : Total 6 Click Score: 11    End of Session Equipment Utilized During Treatment: Gait belt Activity Tolerance: Patient tolerated treatment well;Patient limited by fatigue Patient left: in chair;with call bell/phone within reach;with chair alarm set;with family/visitor present;Other (comment) (L prevalon boot in place) Nurse Communication: Mobility status;Precautions PT Visit Diagnosis: Other abnormalities of gait and mobility (R26.89);Muscle weakness (generalized)  (M62.81);Unsteadiness on feet (R26.81)     Time: 1610-9604 PT Time Calculation (min) (ACUTE ONLY): 29 min  Charges:    $Therapeutic Activity: 23-37 mins PT General Charges $$ ACUTE PT VISIT: 1 Visit                     Hendricks Limes, PT 12/02/22, 11:15 AM

## 2023-01-08 ENCOUNTER — Ambulatory Visit: Payer: PPO | Admitting: Internal Medicine

## 2023-01-18 DEATH — deceased
